# Patient Record
Sex: Male | Born: 1935 | Race: White | Hispanic: No | Smoking: Never smoker
Health system: Southern US, Community
[De-identification: ages and names within clinical notes are randomized; demographics above are authoritative.]

## PROBLEM LIST (undated history)

## (undated) DIAGNOSIS — M1990 Unspecified osteoarthritis, unspecified site: Secondary | ICD-10-CM

## (undated) DIAGNOSIS — I499 Cardiac arrhythmia, unspecified: Secondary | ICD-10-CM

## (undated) DIAGNOSIS — Z87442 Personal history of urinary calculi: Secondary | ICD-10-CM

## (undated) DIAGNOSIS — D649 Anemia, unspecified: Secondary | ICD-10-CM

## (undated) DIAGNOSIS — I351 Nonrheumatic aortic (valve) insufficiency: Secondary | ICD-10-CM

## (undated) DIAGNOSIS — E782 Mixed hyperlipidemia: Secondary | ICD-10-CM

## (undated) DIAGNOSIS — I872 Venous insufficiency (chronic) (peripheral): Secondary | ICD-10-CM

## (undated) DIAGNOSIS — C801 Malignant (primary) neoplasm, unspecified: Secondary | ICD-10-CM

## (undated) DIAGNOSIS — I6529 Occlusion and stenosis of unspecified carotid artery: Secondary | ICD-10-CM

## (undated) DIAGNOSIS — I480 Paroxysmal atrial fibrillation: Secondary | ICD-10-CM

## (undated) DIAGNOSIS — I700 Atherosclerosis of aorta: Secondary | ICD-10-CM

## (undated) HISTORY — PX: OTHER SURGICAL HISTORY: SHX169

## (undated) NOTE — *Deleted (*Deleted)
Brief Pharmacy Note  Pharmacy consulted to review patient's home medication of  Tikosyn. Confirmed with patient no missed doses and last Tikosyn dose was today at ~0530. Per patient, he typically takes the first dose of Tikosyn around 0800. Confirmed with patient that he takes Tikosyn 125 mcg Q12H.   Will resume home dose of Tikosyn 125 mcg Q12H.   11/18 1519 K 4, Mg 2, Scr 0.99. No electrolyte replacement needed at this time. Will continue to monitor and replace for goal of K > 4 and Mg > 2.  Thank you for allowing pharmacy to be a part of this patient's care.  Marcelino Scot PharmD Candidate [phone]

---

## 2006-08-29 HISTORY — PX: HERNIA REPAIR: SHX51

## 2008-08-29 DIAGNOSIS — Z87442 Personal history of urinary calculi: Secondary | ICD-10-CM

## 2008-08-29 HISTORY — DX: Personal history of urinary calculi: Z87.442

## 2010-08-29 HISTORY — PX: EYE SURGERY: SHX253

## 2014-07-16 DIAGNOSIS — N400 Enlarged prostate without lower urinary tract symptoms: Secondary | ICD-10-CM | POA: Insufficient documentation

## 2015-01-22 DIAGNOSIS — I340 Nonrheumatic mitral (valve) insufficiency: Secondary | ICD-10-CM | POA: Insufficient documentation

## 2015-04-29 DIAGNOSIS — I779 Disorder of arteries and arterioles, unspecified: Secondary | ICD-10-CM | POA: Insufficient documentation

## 2015-07-21 DIAGNOSIS — E7800 Pure hypercholesterolemia, unspecified: Secondary | ICD-10-CM | POA: Insufficient documentation

## 2015-07-21 DIAGNOSIS — N529 Male erectile dysfunction, unspecified: Secondary | ICD-10-CM | POA: Insufficient documentation

## 2015-10-20 DIAGNOSIS — D531 Other megaloblastic anemias, not elsewhere classified: Secondary | ICD-10-CM | POA: Diagnosis present

## 2015-10-28 DIAGNOSIS — I441 Atrioventricular block, second degree: Secondary | ICD-10-CM | POA: Insufficient documentation

## 2016-06-16 ENCOUNTER — Encounter
Admission: RE | Admit: 2016-06-16 | Discharge: 2016-06-16 | Disposition: A | Discharge status: Home / Self Care | Payer: Medicare Other | Source: Ambulatory Visit | Attending: Surgery | Admitting: Surgery

## 2016-06-16 DIAGNOSIS — K4090 Unilateral inguinal hernia, without obstruction or gangrene, not specified as recurrent: Secondary | ICD-10-CM | POA: Insufficient documentation

## 2016-06-16 DIAGNOSIS — Z01818 Encounter for other preprocedural examination: Secondary | ICD-10-CM | POA: Insufficient documentation

## 2016-06-16 HISTORY — DX: Malignant (primary) neoplasm, unspecified: C80.1

## 2016-06-16 HISTORY — DX: Personal history of urinary calculi: Z87.442

## 2016-06-16 HISTORY — DX: Unspecified osteoarthritis, unspecified site: M19.90

## 2016-06-16 HISTORY — DX: Cardiac arrhythmia, unspecified: I49.9

## 2016-06-16 NOTE — Pre-Procedure Instructions (Signed)
Name Value Ref Range  Vent Rate (bpm) 66   PR Interval (msec) 264   QRS Interval (msec) 120   QT Interval (msec) 436   QTc (msec) 457   Result Narrative  Sinus rhythm with marked sinus arrhythmia 1st degree AV block Right bundle branch block Left anterior fascicular block  Bifascicular block  Cannot rule out Anterior infarct , age undetermined Abnormal ECG When compared with ECG of 02-Feb-2016 11:14, No significant change was found I reviewed and concur with this report. Electronically signed [id number] MD, Darnell Level [phone]) on 05/21/2016 7:50:41 AM  Status Results Details

## 2016-06-16 NOTE — Patient Instructions (Addendum)
  Your procedure is scheduled on: June 24, 2016  Report to [facility]  To find out your arrival time please call [phone] between [place], June 23, 2016  Remember: Instructions that are not followed completely may result in serious medical risk, up to and including death, or upon the discretion of your surgeon and anesthesiologist your surgery may need to be rescheduled.    __X__ 1. Do not eat food or drink liquids after midnight. No gum chewing or hard candies.      __X__ 2. No Alcohol for 24 hours before or after surgery.   ____ 3. Do Not Smoke For 24 Hours Prior to Your Surgery.   ____ 4. Bring all medications with you on the day of surgery if instructed.    __X__ 5. Notify your doctor if there is any change in your medical condition     (cold, fever, infections).       Do not wear jewelry, make-up, hairpins, clips or nail polish.  Do not wear lotions, powders, or perfumes. You may wear deodorant.  Do not shave 48 hours prior to surgery. Men may shave face and neck.  Do not bring valuables to the hospital.    [facility] is not responsible for any belongings or valuables.               Contacts, dentures or bridgework may not be worn into surgery.  Leave your suitcase in the car. After surgery it may be brought to your room.  For patients admitted to the hospital, discharge time is determined by your treatment team.   Patients discharged the day of surgery will not be allowed to drive home.   Please read over the following fact sheets that you were given:   Pre surgery soap wash  ___x_ Take these medicines the morning of surgery with A SIP OF WATER:    1. TIKOSYN AND POTASSIUM...TAKE BOTH AS USUAL (AT 9AM) IF SURGERY SCHEDULED LATER IN DAY.  IF SURGERY IS                         SCHEDULED FOR FIRST EARLY MORNING CASE, BRING BOTH PILLS TO [place] WITH YOU  2.   3.   4.  5.  6.  ____ Fleet Enema (as directed)   __x__ Use  CHG Soap as directed  ____ Use inhalers on the day of surgery  ____ Stop metformin 2 days prior to surgery    ____ Take 1/2 of usual insulin dose the night before surgery and none on the morning of surgery.   __X__ Stop PRADAXA ON October 24TH   __X__ Stop supplements until after surgery.    ____ Bring C-Pap to the hospital.    PLEASE TRY TO REMEMBER TO BRING LIVING WILL AND POA PAPERS TO [place] ON THE DAY OF SURGERY SO THEY MAY BECOME A  PERMANENT PART OF YOUR CHART

## 2016-06-24 ENCOUNTER — Ambulatory Visit
Admission: RE | Admit: 2016-06-24 | Discharge: 2016-06-24 | Disposition: A | Discharge status: Home / Self Care | Payer: Medicare Other | Source: Ambulatory Visit | Attending: Surgery | Admitting: Surgery

## 2016-06-24 ENCOUNTER — Ambulatory Visit: Payer: Medicare Other | Admitting: Registered Nurse

## 2016-06-24 ENCOUNTER — Encounter
Admission: RE | Disposition: A | Discharge status: Home / Self Care | Payer: Self-pay | Source: Ambulatory Visit | Attending: Surgery

## 2016-06-24 ENCOUNTER — Encounter: Payer: Self-pay | Admitting: *Deleted

## 2016-06-24 DIAGNOSIS — K4090 Unilateral inguinal hernia, without obstruction or gangrene, not specified as recurrent: Secondary | ICD-10-CM | POA: Diagnosis not present

## 2016-06-24 DIAGNOSIS — I4891 Unspecified atrial fibrillation: Secondary | ICD-10-CM | POA: Insufficient documentation

## 2016-06-24 DIAGNOSIS — M1990 Unspecified osteoarthritis, unspecified site: Secondary | ICD-10-CM | POA: Diagnosis not present

## 2016-06-24 DIAGNOSIS — Z79899 Other long term (current) drug therapy: Secondary | ICD-10-CM | POA: Diagnosis not present

## 2016-06-24 HISTORY — PX: INGUINAL HERNIA REPAIR: SHX194

## 2016-06-24 SURGERY — REPAIR, HERNIA, INGUINAL, ADULT
Anesthesia: General | Site: Inguinal | Laterality: Right | Wound class: Clean Contaminated

## 2016-06-24 MED ORDER — HYDROCODONE-ACETAMINOPHEN 5-325 MG PO TABS: 1.0000 | ORAL_TABLET | ORAL | 0 refills | Status: DC | PRN

## 2016-06-24 MED ORDER — LIDOCAINE HCL (CARDIAC) 20 MG/ML IV SOLN
INTRAVENOUS | Status: DC | PRN
  Administered 2016-06-24: 80 mg via INTRAVENOUS

## 2016-06-24 MED ORDER — FENTANYL CITRATE (PF) 100 MCG/2ML IJ SOLN
INTRAMUSCULAR | Status: DC | PRN
  Administered 2016-06-24 (×2): 25 ug via INTRAVENOUS

## 2016-06-24 MED ORDER — CEFAZOLIN SODIUM-DEXTROSE 2-4 GM/100ML-% IV SOLN
INTRAVENOUS | Status: AC
  Administered 2016-06-24: 2 g via INTRAVENOUS
  Filled 2016-06-24: qty 100

## 2016-06-24 MED ORDER — BUPIVACAINE-EPINEPHRINE (PF) 0.5% -1:200000 IJ SOLN
INTRAMUSCULAR | Status: DC | PRN
  Administered 2016-06-24: 18 mL

## 2016-06-24 MED ORDER — FENTANYL CITRATE (PF) 100 MCG/2ML IJ SOLN: 25.0000 ug | INTRAMUSCULAR | Status: DC | PRN

## 2016-06-24 MED ORDER — PROPOFOL 10 MG/ML IV BOLUS
INTRAVENOUS | Status: DC | PRN
  Administered 2016-06-24: 200 mg via INTRAVENOUS

## 2016-06-24 MED ORDER — CEFAZOLIN SODIUM-DEXTROSE 2-4 GM/100ML-% IV SOLN
2.0000 g | Freq: Once | INTRAVENOUS | Status: AC
  Administered 2016-06-24: 2 g via INTRAVENOUS

## 2016-06-24 MED ORDER — ONDANSETRON HCL 4 MG/2ML IJ SOLN
INTRAMUSCULAR | Status: DC | PRN
  Administered 2016-06-24: 4 mg via INTRAVENOUS

## 2016-06-24 MED ORDER — MIDAZOLAM HCL 2 MG/2ML IJ SOLN
INTRAMUSCULAR | Status: DC | PRN
  Administered 2016-06-24: 1 mg via INTRAVENOUS

## 2016-06-24 MED ORDER — EPHEDRINE SULFATE 50 MG/ML IJ SOLN
INTRAMUSCULAR | Status: DC | PRN
  Administered 2016-06-24 (×3): 5 mg via INTRAVENOUS

## 2016-06-24 MED ORDER — PHENYLEPHRINE HCL 10 MG/ML IJ SOLN
INTRAMUSCULAR | Status: DC | PRN
  Administered 2016-06-24: 100 ug via INTRAVENOUS

## 2016-06-24 MED ORDER — HYDROCODONE-ACETAMINOPHEN 5-325 MG PO TABS: 1.0000 | ORAL_TABLET | ORAL | Status: DC | PRN

## 2016-06-24 MED ORDER — BUPIVACAINE-EPINEPHRINE (PF) 0.5% -1:200000 IJ SOLN
INTRAMUSCULAR | Status: AC
  Filled 2016-06-24: qty 30

## 2016-06-24 MED ORDER — GLYCOPYRROLATE 0.2 MG/ML IJ SOLN
INTRAMUSCULAR | Status: DC | PRN
  Administered 2016-06-24: 0.2 mg via INTRAVENOUS

## 2016-06-24 MED ORDER — FAMOTIDINE 20 MG PO TABS
ORAL_TABLET | ORAL | Status: AC
  Administered 2016-06-24: 20 mg via ORAL
  Filled 2016-06-24: qty 1

## 2016-06-24 MED ORDER — FAMOTIDINE 20 MG PO TABS
20.0000 mg | ORAL_TABLET | Freq: Once | ORAL | Status: AC
  Administered 2016-06-24: 20 mg via ORAL

## 2016-06-24 MED ORDER — LACTATED RINGERS IV SOLN
INTRAVENOUS | Status: DC
  Administered 2016-06-24: 10:00:00 via INTRAVENOUS

## 2016-06-24 MED ORDER — ONDANSETRON HCL 4 MG/2ML IJ SOLN: 4.0000 mg | Freq: Once | INTRAMUSCULAR | Status: DC | PRN

## 2016-06-24 SURGICAL SUPPLY — 32 items
BLADE CLIPPER SURG (BLADE) ×3 IMPLANT
BLADE SURG 15 STRL LF DISP TIS (BLADE) ×1 IMPLANT
BLADE SURG 15 STRL SS (BLADE) ×2
CANISTER SUCT 1200ML W/VALVE (MISCELLANEOUS) ×3 IMPLANT
CHLORAPREP W/TINT 26ML (MISCELLANEOUS) ×3 IMPLANT
DERMABOND ADVANCED (GAUZE/BANDAGES/DRESSINGS) ×2
DERMABOND ADVANCED .7 DNX12 (GAUZE/BANDAGES/DRESSINGS) ×1 IMPLANT
DRAIN PENROSE 5/8X18 LTX STRL (WOUND CARE) ×3 IMPLANT
DRAPE LAPAROTOMY 77X122 PED (DRAPES) ×3 IMPLANT
ELECT REM PT RETURN 9FT ADLT (ELECTROSURGICAL) ×3
ELECTRODE REM PT RTRN 9FT ADLT (ELECTROSURGICAL) ×1 IMPLANT
GLOVE BIO SURGEON STRL SZ 6.5 (GLOVE) ×2 IMPLANT
GLOVE BIO SURGEON STRL SZ7.5 (GLOVE) ×15 IMPLANT
GLOVE BIO SURGEONS STRL SZ 6.5 (GLOVE) ×1
GLOVE INDICATOR 7.0 STRL GRN (GLOVE) ×3 IMPLANT
GLOVE INDICATOR 8.0 STRL GRN (GLOVE) ×3 IMPLANT
GOWN STRL REUS W/ TWL LRG LVL3 (GOWN DISPOSABLE) ×4 IMPLANT
GOWN STRL REUS W/TWL LRG LVL3 (GOWN DISPOSABLE) ×8
KIT RM TURNOVER STRD PROC AR (KITS) ×3 IMPLANT
LABEL OR SOLS (LABEL) IMPLANT
LIQUID BAND (GAUZE/BANDAGES/DRESSINGS) IMPLANT
MESH SYNTHETIC 4X6 SOFT BARD (Mesh General) ×1 IMPLANT
MESH SYNTHETIC SOFT BARD 4X6 (Mesh General) ×2 IMPLANT
NEEDLE HYPO 25X1 1.5 SAFETY (NEEDLE) ×3 IMPLANT
NS IRRIG 500ML POUR BTL (IV SOLUTION) ×3 IMPLANT
PACK BASIN MINOR ARMC (MISCELLANEOUS) ×3 IMPLANT
SUT CHROMIC 4 0 RB 1X27 (SUTURE) ×3 IMPLANT
SUT MNCRL AB 4-0 PS2 18 (SUTURE) ×3 IMPLANT
SUT SURGILON 0 30 BLK (SUTURE) ×9 IMPLANT
SUT VIC AB 4-0 SH 27 (SUTURE) ×2
SUT VIC AB 4-0 SH 27XANBCTRL (SUTURE) ×1 IMPLANT
SYRINGE 10CC LL (SYRINGE) ×3 IMPLANT

## 2016-06-24 NOTE — Anesthesia Preprocedure Evaluation (Signed)
Anesthesia Evaluation  Patient identified by MRN, date of birth, ID band Patient awake    Reviewed: Allergy & Precautions, NPO status , Patient's Chart, lab work & pertinent test results, reviewed documented beta blocker date and time   Airway Mallampati: II  TM Distance: >3 FB     Dental  (+) Chipped   Pulmonary           Cardiovascular + dysrhythmias Atrial Fibrillation      Neuro/Psych    GI/Hepatic   Endo/Other    Renal/GU      Musculoskeletal  (+) Arthritis ,   Abdominal   Peds  Hematology   Anesthesia Other Findings   Reproductive/Obstetrics                             Anesthesia Physical Anesthesia Plan  ASA: III  Anesthesia Plan: General   Post-op Pain Management:    Induction: Intravenous  Airway Management Planned: LMA  Additional Equipment:   Intra-op Plan:   Post-operative Plan:   Informed Consent: I have reviewed the patients History and Physical, chart, labs and discussed the procedure including the risks, benefits and alternatives for the proposed anesthesia with the patient or authorized representative who has indicated his/her understanding and acceptance.     Plan Discussed with: CRNA  Anesthesia Plan Comments:         Anesthesia Quick Evaluation

## 2016-06-24 NOTE — Op Note (Signed)
OPERATIVE REPORT  PREOPERATIVE DIAGNOSIS: right inguinal hernia  POSTOPERATIVE DIAGNOSIS:right  inguinal hernia  PROCEDURE:  right inguinal hernia repair  ANESTHESIA:  General  SURGEON:  Rochel Brome M.D.  INDICATIONS: He reports recent bulging in the right groin. A right inguinal hernia was demonstrated on physical exam and repair was recommended for definitive treatment.  With the patient on the operating table in the supine position the right lower quadrant was prepared with clippers and with ChloraPrep and draped in a sterile manner. A transversely oriented suprapubic incision was made and carried down through subcutaneous tissues. Electrocautery was used for hemostasis. 2 traversing veins were suture ligated proximally and distally and divided. The Scarpa's fascia was incised. The external oblique aponeurosis was incised along the course of its fibers to open the external ring and expose the inguinal cord structures. The cord structures were mobilized. A Penrose drain was passed around the cord structures for traction. Cremaster fibers were separated and explored the cord structures. There was a tiny bit the peritoneum which was grasped which was about 12 mm in length and was ligated with 4-0 Vicryl and amputated. There was a direct inguinal hernia. The attenuated transversalis fascia was incised circumferentially and removed. Tissues were not submitted for pathology. The hernia sac was inverted. The repair was carried out with interrupted 0 Surgilon simple sutures suturing the conjoined tendon to the shelving edge of the inguinal ligament incorporating transversalis fascia into the repair. The last stitch led up to the internal ring.  Bard soft mesh was cut to create an oval shape and was placed over the repair. This was sutured to the repair with interrupted 0 Surgilon sutures and also sutured medially to the deep fascia and on both sides of the internal ring. Next after seeing hemostasis was  intact the cord structures were replaced along the floor of the inguinal canal. The cut edges of the external oblique aponeurosis were closed with a running 4-0 Vicryl suture to re-create the external ring. The deep fascia superior and lateral to the repair site was infiltrated with half percent Sensorcaine with epinephrine. Subcutaneous tissues were also infiltrated. The Scarpa's fascia was closed with interrupted 4-0 Vicryl sutures. The skin was closed with running 4-0 Monocryl subcuticular suture and LiquiBand. The testicle remained in the scrotum  The patient appeared to be in satisfactory condition and was prepared for transfer to the recovery room.  Rochel Brome M.D.

## 2016-06-24 NOTE — Anesthesia Postprocedure Evaluation (Signed)
Anesthesia Post Note  Patient: Terry Collins  Procedure(s) Performed: Procedure(s) (LRB): HERNIA REPAIR INGUINAL ADULT (Right)  Patient location during evaluation: PACU Anesthesia Type: General Level of consciousness: awake and alert Pain management: pain level controlled Vital Signs Assessment: post-procedure vital signs reviewed and stable Respiratory status: spontaneous breathing, nonlabored ventilation, respiratory function stable and patient connected to nasal cannula oxygen Cardiovascular status: blood pressure returned to baseline and stable Postop Assessment: no signs of nausea or vomiting Anesthetic complications: no    Last Vitals:  Vitals:   06/24/16 1258 06/24/16 1350  BP: (!) 147/83 133/61  Pulse: 64 66  Resp: 16 16  Temp:      Last Pain:  Vitals:   06/24/16 1350  TempSrc:   PainSc: [place]

## 2016-06-24 NOTE — Anesthesia Procedure Notes (Signed)
Procedure Name: LMA Insertion Date/Time: 06/24/2016 10:32 AM Performed by: Hedda Slade Pre-anesthesia Checklist: Patient identified, Emergency Drugs available, Suction available, Patient being monitored and Timeout performed Patient Re-evaluated:Patient Re-evaluated prior to inductionOxygen Delivery Method: Circle system utilized Preoxygenation: Pre-oxygenation with 100% oxygen Intubation Type: IV induction Ventilation: Mask ventilation without difficulty LMA: LMA inserted LMA Size: 4.5 Number of attempts: 1 Placement Confirmation: positive ETCO2,  CO2 detector and breath sounds checked- equal and bilateral Tube secured with: Tape Dental Injury: Teeth and Oropharynx as per pre-operative assessment

## 2016-06-24 NOTE — H&P (Signed)
  He reports no change in condition since office visit  He last took Pradaxa 4 days ago  Right side marked YES  Labs noted  Discussed plan for right inguinal hernia repair

## 2016-06-24 NOTE — Transfer of Care (Signed)
Immediate Anesthesia Transfer of Care Note  Patient: Terry Collins  Procedure(s) Performed: Procedure(s): HERNIA REPAIR INGUINAL ADULT (Right)  Patient Location: PACU  Anesthesia Type:General  Level of Consciousness: awake, alert  and patient cooperative  Airway & Oxygen Therapy: Patient Spontanous Breathing and Patient connected to face mask oxygen  Post-op Assessment: Report given to RN and Post -op Vital signs reviewed and stable  Post vital signs: Reviewed and stable  Last Vitals:  Vitals:   06/24/16 0950 06/24/16 1205  BP: 111/68 132/65  Pulse: 72 80  Resp: 16 14  Temp: 36.3 C (P) 36.4 C    Last Pain:  Vitals:   06/24/16 0950  TempSrc: Tympanic         Complications: No apparent anesthesia complications

## 2016-06-24 NOTE — Discharge Instructions (Signed)
AMBULATORY SURGERY  DISCHARGE INSTRUCTIONS   1) The drugs that you were given will stay in your system until tomorrow so for the next 24 hours you should not:  A) Drive an automobile B) Make any legal decisions C) Drink any alcoholic beverage   2) You may resume regular meals tomorrow.  Today it is better to start with liquids and gradually work up to solid foods.  You may eat anything you prefer, but it is better to start with liquids, then soup and crackers, and gradually work up to solid foods.   3) Please notify your doctor immediately if you have any unusual bleeding, trouble breathing, redness and pain at the surgery site, drainage, fever, or pain not relieved by medication.   4) Additional Instructions: Take Tylenol or Norco if needed for pain.  Should not drive or do anything dangerous when taking Norco.  Resume Pradaxa Saturday evening.  May shower.  Avoid straining and heavy lifting.

## 2016-06-27 ENCOUNTER — Encounter: Payer: Self-pay | Admitting: Surgery

## 2017-07-26 DIAGNOSIS — R3129 Other microscopic hematuria: Secondary | ICD-10-CM | POA: Insufficient documentation

## 2017-08-29 HISTORY — PX: CARDIAC ELECTROPHYSIOLOGY STUDY AND ABLATION: SHX1294

## 2017-08-31 DIAGNOSIS — I452 Bifascicular block: Secondary | ICD-10-CM | POA: Insufficient documentation

## 2018-08-14 DIAGNOSIS — F4321 Adjustment disorder with depressed mood: Secondary | ICD-10-CM | POA: Insufficient documentation

## 2018-09-04 DIAGNOSIS — I872 Venous insufficiency (chronic) (peripheral): Secondary | ICD-10-CM | POA: Insufficient documentation

## 2018-10-04 DIAGNOSIS — I351 Nonrheumatic aortic (valve) insufficiency: Secondary | ICD-10-CM | POA: Insufficient documentation

## 2019-09-16 ENCOUNTER — Other Ambulatory Visit: Payer: Self-pay | Admitting: Orthopedic Surgery

## 2019-09-23 ENCOUNTER — Other Ambulatory Visit: Payer: Self-pay | Admitting: Orthopedic Surgery

## 2019-09-23 DIAGNOSIS — R1900 Intra-abdominal and pelvic swelling, mass and lump, unspecified site: Secondary | ICD-10-CM

## 2019-10-04 ENCOUNTER — Other Ambulatory Visit: Payer: Self-pay

## 2019-10-04 ENCOUNTER — Ambulatory Visit
Admission: RE | Admit: 2019-10-04 | Discharge: 2019-10-04 | Disposition: A | Discharge status: Home / Self Care | Payer: Medicare Other | Source: Ambulatory Visit | Attending: Orthopedic Surgery | Admitting: Orthopedic Surgery

## 2019-10-04 DIAGNOSIS — R1900 Intra-abdominal and pelvic swelling, mass and lump, unspecified site: Secondary | ICD-10-CM | POA: Diagnosis not present

## 2019-10-04 LAB — POCT I-STAT CREATININE: Creatinine, Ser: 1 mg/dL (ref 0.61–1.24)

## 2019-10-04 MED ORDER — IOHEXOL 300 MG/ML  SOLN
100.0000 mL | Freq: Once | INTRAMUSCULAR | Status: AC | PRN
  Administered 2019-10-04: 100 mL via INTRAVENOUS

## 2019-10-10 DIAGNOSIS — I5033 Acute on chronic diastolic (congestive) heart failure: Secondary | ICD-10-CM | POA: Diagnosis present

## 2020-01-02 ENCOUNTER — Ambulatory Visit: Payer: Self-pay | Admitting: General Surgery

## 2020-01-02 NOTE — H&P (Signed)
PATIENT PROFILE: Terry Collins is a 84 y.o. male who presents to the Clinic for consultation at the request of Terry Collins for evaluation of recurrent inguinal hernia.  PCP:  Terry Glatter, MD  HISTORY OF PRESENT ILLNESS: Terry Collins reports patient with previous history of bilateral inguinal hernia repair different times.  Most recently he had the right inguinal hernia repair 4 years ago.  He reports that in the last 6 months he has been feeling a bulging.  He endorses having discomfort with bulging.  He reports that when he lies down the bleeding goes in and then when he stands up and start walking the bulging comes out.  There is no pain radiation.  Alleviating factor is laying down on reduction of the bulge.  Aggravating factor is ambulating and applying pressure over the bulge.  Denies abdominal distention nausea or vomiting.  The patient also reports having some discomfort on the left side but not as much as on the right side.  Left inguinal hernia repair was done more than 10 years ago.   PROBLEM LIST:        Problem List  Date Reviewed: 12/26/2019       Noted   Rosacea, acne 12/26/2019   Mild left ventricular systolic dysfunction 3/41/9379   Anemia 10/10/2019   Major depression, melancholic type 02/40/9735   Moderate aortic valve insufficiency 10/04/2018   Venous insufficiency of both lower extremities 09/04/2018   Grief 08/14/2018   RBBB (right bundle branch block with left anterior fascicular block) 08/31/2017   Visit for monitoring Tikosyn therapy 08/31/2017   Hematuria, microscopic 07/26/2017   Right inguinal hernia 05/12/2016   Mobitz type 1 second degree atrioventricular block 10/28/2015   Megaloblastic anemia 10/20/2015   Pure hypercholesterolemia 07/21/2015   Erectile dysfunction 07/21/2015   Bilateral carotid artery stenosis 04/29/2015   Overview    Less than 50% 2016      Moderate mitral insufficiency 01/22/2015   Chronic anemia, unspecified  01/15/2015   Mixed hyperlipidemia 10/09/2014   BPH without urinary obstruction 07/16/2014   Atrial fibrillation, currently on tikosyn and dabigatran 12/29/2011      GENERAL REVIEW OF SYSTEMS:   General ROS: negative for - chills, fatigue, fever, weight gain or weight loss Allergy and Immunology ROS: negative for - hives  Hematological and Lymphatic ROS: negative for - bleeding problems or bruising, negative for palpable nodes Endocrine ROS: negative for - heat or cold intolerance, hair changes Respiratory ROS: negative for - cough, shortness of breath or wheezing Cardiovascular ROS: no chest pain or palpitations GI ROS: negative for nausea, vomiting, abdominal pain, diarrhea, constipation Musculoskeletal ROS: negative for - joint swelling or muscle pain Neurological ROS: negative for - confusion, syncope Dermatological ROS: negative for pruritus and rash Psychiatric: negative for anxiety, depression, difficulty sleeping and memory loss  MEDICATIONS: Current Medications        Current Outpatient Medications  Medication Sig Dispense Refill  . azelaic acid (FINACEA) 15 % topical gel Apply topically daily. After skin is thoroughly washed and patted dry, gently but thoroughly massage a thin film of azelaic acid cream into the affected area twice daily, in the morning and evening.    . cycloSPORINE (RESTASIS) 0.05 % ophthalmic emulsion Place 1 drop into both eyes 2 (two) times daily.    . dabigatran (PRADAXA) 150 mg capsule Take 1 capsule (150 mg total) by mouth 2 (two) times daily 180 capsule 4  . dofetilide (TIKOSYN) 125 MCG capsule Take 1 capsule (125 mcg  total) by mouth every 12 (twelve) hours 180 capsule 0  . ferrous sulfate 325 (65 FE) MG tablet Take 325 mg by mouth daily with breakfast    . folic acid (FOLVITE) 1 MG tablet Take 1 mg by mouth once daily    . metroNIDAZOLE (METROGEL) 1 % gel Apply topically once daily    . multivitamin capsule Take 1 capsule by mouth  daily.    Marland Kitchen NOCDURNA, MEN, 55.3 mcg TbDL Take 55.3 mcg by mouth nightly    . potassium chloride (MICRO-K) 10 MEQ ER capsule TAKE 1 CAPSULE DAILY 90 capsule 3  . vit B complex no.12/niacin,B3, (VITAMIN B COMPLEX NO.12-NIACIN ORAL) Take by mouth     No current facility-administered medications for this visit.      ALLERGIES: Patient has no known allergies.  PAST MEDICAL HISTORY:     Past Medical History:  Diagnosis Date  . Arrhythmia   . Atrial fibrillation (CMS-HCC)   . Bilateral carotid artery stenosis   . Bilateral carotid artery stenosis   . Cardiomyopathy, secondary (CMS-HCC)   . Chronic anemia, unspecified   . Hyperlipidemia   . Hypertension   . Major depression, melancholic type 63/08/6008  . Megaloblastic anemia   . Rosacea     PAST SURGICAL HISTORY:      Past Surgical History:  Procedure Laterality Date  . cardiac ablation    . CARDIAC CATHETERIZATION  Oct 11, 2017  . CATARACT EXTRACTION Bilateral 2012  . HERNIA REPAIR  2008  . INGUINAL HERNIA REPAIR Right 06/24/2016   Dr Rochel Brome     FAMILY HISTORY:      Family History  Problem Relation Age of Onset  . Myocardial Infarction (Heart attack) Mother   . Breast cancer Mother   . Stroke Mother   . Cancer Father   . Heart disease Father   . Myocardial Infarction (Heart attack) Father   . Skin cancer Brother   . Atrial fibrillation (Abnormal heart rhythm sometimes requiring treatment with blood thinners) Son      SOCIAL HISTORY: Social History     Socioeconomic History  . Marital status: Widowed    Spouse name: Not on file  . Number of children: 2  . Years of education: 107  . Highest education level: Not on file  Occupational History  . Occupation: Retired Leisure centre manager  . Smoking status: Never Smoker  . Smokeless tobacco: Never Used  Vaping Use  . Vaping Use: Never used  Substance and Sexual Activity  . Alcohol use: Yes    Alcohol/week: 2.0  standard drinks    Types: 2 Glasses of wine per week  . Drug use: No  . Sexual activity: Not Currently    Partners: Female    Birth control/protection: None    Comment: I am a widower  Other Topics Concern  . Not on file  Social History Narrative  . Not on file   Social Determinants of Health      Financial Resource Strain:   . Difficulty of Paying Living Expenses:   Food Insecurity:   . Worried About Charity fundraiser in the Last Year:   . Arboriculturist in the Last Year:   Transportation Needs:   . Film/video editor (Medical):   Marland Kitchen Lack of Transportation (Non-Medical):       PHYSICAL EXAM:    Vitals:   12/31/19 1020  BP: 122/72  Pulse: 66   Body mass index is 21.06  kg/m. Weight: 74.4 kg (164 lb)   GENERAL: Alert, active, oriented x3  HEENT: Pupils equal reactive to light. Extraocular movements are intact. Sclera clear. Palpebral conjunctiva normal red color.Pharynx clear.  NECK: Supple with no palpable mass and no adenopathy.  LUNGS: Sound clear with no rales rhonchi or wheezes.  HEART: Regular rhythm S1 and S2 without murmur.  ABDOMEN: Soft and depressible, nontender with no palpable mass, no hepatomegaly.  Reducible right inguinal hernia, moderate size.  Difficult to appreciate on physical exam left inguinal hernia.  EXTREMITIES: Well-developed well-nourished symmetrical with no dependent edema.  NEUROLOGICAL: Awake alert oriented, facial expression symmetrical, moving all extremities.  REVIEW OF DATA: I have reviewed the following data today:      Appointment on 12/19/2019  Component Date Value  . WBC (White Blood Cell Co* 12/19/2019 6.8   . RBC (Red Blood Cell Coun* 12/19/2019 3.90*  . Hemoglobin 12/19/2019 13.3*  . Hematocrit 12/19/2019 40.1   . MCV (Mean Corpuscular Vo* 12/19/2019 102.8*  . MCH (Mean Corpuscular He* 12/19/2019 34.1*  . MCHC (Mean Corpuscular H* 12/19/2019 33.2   . Platelet Count 12/19/2019 194   .  RDW-CV (Red Cell Distrib* 12/19/2019 13.2   . MPV (Mean Platelet Volum* 12/19/2019 10.4   . Neutrophils 12/19/2019 4.27   . Lymphocytes 12/19/2019 1.77   . Monocytes 12/19/2019 0.67   . Eosinophils 12/19/2019 0.06   . Basophils 12/19/2019 0.03   . Neutrophil % 12/19/2019 62.6   . Lymphocyte % 12/19/2019 26.0   . Monocyte % 12/19/2019 9.8   . Eosinophil % 12/19/2019 0.9*  . Basophil% 12/19/2019 0.4   . Immature Granulocyte % 12/19/2019 0.3   . Immature Granulocyte Cou* 12/19/2019 0.02   . Glucose 12/19/2019 92   . Sodium 12/19/2019 142   . Potassium 12/19/2019 4.1   . Chloride 12/19/2019 104   . Carbon Dioxide (CO2) 12/19/2019 33.8*  . Urea Nitrogen (BUN) 12/19/2019 30*  . Creatinine 12/19/2019 1.0   . Glomerular Filtration Ra* 12/19/2019 72   . Calcium 12/19/2019 9.4   . AST  12/19/2019 16   . ALT  12/19/2019 13   . Alk Phos (alkaline Phosp* 12/19/2019 99   . Albumin 12/19/2019 3.9   . Bilirubin, Total 12/19/2019 0.9   . Protein, Total 12/19/2019 6.5   . A/G Ratio 12/19/2019 1.5   . Color 12/19/2019 Yellow   . Clarity 12/19/2019 Clear   . Specific Gravity 12/19/2019 1.025   . pH, Urine 12/19/2019 6.0   . Protein, Urinalysis 12/19/2019 Negative   . Glucose, Urinalysis 12/19/2019 Negative   . Ketones, Urinalysis 12/19/2019 Negative   . Blood, Urinalysis 12/19/2019 Small*  . Nitrite, Urinalysis 12/19/2019 Negative   . Leukocyte Esterase, Urin* 12/19/2019 Negative   . White Blood Cells, Urina* 12/19/2019 None Seen   . Red Blood Cells, Urinaly* 12/19/2019 4-10*  . Bacteria, Urinalysis 12/19/2019 None Seen   . Squamous Epithelial Cell* 12/19/2019 None Seen   . Crystals, Urinalysis 12/19/2019 Few*  Office Visit on 11/07/2019  Component Date Value  . Vent Rate (bpm) 11/07/2019 82   . PR Interval (msec) 11/07/2019 296   . QRS Interval (msec) 11/07/2019 124   . QT Interval (msec) 11/07/2019 416   . QTc (msec) 11/07/2019 486   . Glucose 11/07/2019 92   . Sodium 11/07/2019  136   . Potassium 11/07/2019 4.4   . Chloride 11/07/2019 99   . Carbon Dioxide (CO2) 11/07/2019 34.5*  . Calcium 11/07/2019 9.4   . Urea Nitrogen (  BUN) 11/07/2019 19   . Creatinine 11/07/2019 0.9   . Glomerular Filtration Ra* 11/07/2019 81   . BUN/Crea Ratio 11/07/2019 21.1*  . Anion Gap w/K 11/07/2019 6.9   . Magnesium 11/07/2019 1.9      ASSESSMENT: Mr. Moes is a 84 y.o. male presenting for consultation for bilateral recurrent inguinal hernia.    The patient presents with a symptomatic, reducible right inguinal hernia and a clinical left inguinal hernia not easily identified on physical exam. Patient was oriented about the diagnosis of inguinal hernia and its implication. The patient was oriented about the treatment alternatives (observation vs surgical repair). Due to patient symptoms, repair is recommended.  The patient previous open inguinal hernia repair I considered that laparoscopic, robotic assisted inguinal hernia repair will be the best alternative for this patient.  This will also confirm if he has bilateral inguinal hernia or just the right inguinal hernia.  Patient oriented about the surgical procedure, the use of mesh and its risk of complications such as: infection, bleeding, injury to vas deference, vasculature and testicle, injury to bowel or bladder, and chronic pain.  Due to patient history of atrial fibrillation and on anticoagulation, will ask cardiac clearance for holding anticoagulation for at least 48 hours before the surgery.  Patient oriented that he is at higher risk of bleeding due to his chronic history of anticoagulation.  Patient understood and agreed to proceed.  Bilateral recurrent inguinal hernia without obstruction or gangrene [K40.21]  PLAN: 1. Robotic assisted laparoscopic right vs bilateral inguinal hernia repair with mesh ([id number]) 2.  CBC, CMP 3.  Will contact cardiology for recommendations of holding Pradaxa before surgery 4.  Cardiac  Clearance 5.  Contact us if has any question or concern.   Patient verbalized understanding, all questions were answered, and were agreeable with the plan outlined above.   I spent a total of 60 minutes in both face-to-face and non-face-to-face activities for this visit on the date of this encounter.   Herbert Pun, MD  Electronically signed by Herbert Pun, MD

## 2020-01-02 NOTE — H&P (View-Only) (Signed)
PATIENT PROFILE: Terry Collins is a 84 y.o. male who presents to the Clinic for consultation at the request of Dr. Ginette Collins for evaluation of recurrent inguinal hernia.  PCP:  Terry Glatter, MD  HISTORY OF PRESENT ILLNESS: Terry Collins reports patient with previous history of bilateral inguinal hernia repair different times.  Most recently he had the right inguinal hernia repair 4 years ago.  He reports that in the last 6 months he has been feeling a bulging.  He endorses having discomfort with bulging.  He reports that when he lies down the bleeding goes in and then when he stands up and start walking the bulging comes out.  There is no pain radiation.  Alleviating factor is laying down on reduction of the bulge.  Aggravating factor is ambulating and applying pressure over the bulge.  Denies abdominal distention nausea or vomiting.  The patient also reports having some discomfort on the left side but not as much as on the right side.  Left inguinal hernia repair was done more than 10 years ago.   PROBLEM LIST:        Problem List  Date Reviewed: 12/26/2019       Noted   Rosacea, acne 12/26/2019   Mild left ventricular systolic dysfunction 3/41/9379   Anemia 10/10/2019   Major depression, melancholic type 02/40/9735   Moderate aortic valve insufficiency 10/04/2018   Venous insufficiency of both lower extremities 09/04/2018   Grief 08/14/2018   RBBB (right bundle branch block with left anterior fascicular block) 08/31/2017   Visit for monitoring Tikosyn therapy 08/31/2017   Hematuria, microscopic 07/26/2017   Right inguinal hernia 05/12/2016   Mobitz type 1 second degree atrioventricular block 10/28/2015   Megaloblastic anemia 10/20/2015   Pure hypercholesterolemia 07/21/2015   Erectile dysfunction 07/21/2015   Bilateral carotid artery stenosis 04/29/2015   Overview    Less than 50% 2016      Moderate mitral insufficiency 01/22/2015   Chronic anemia, unspecified  01/15/2015   Mixed hyperlipidemia 10/09/2014   BPH without urinary obstruction 07/16/2014   Atrial fibrillation, currently on tikosyn and dabigatran 12/29/2011      GENERAL REVIEW OF SYSTEMS:   General ROS: negative for - chills, fatigue, fever, weight gain or weight loss Allergy and Immunology ROS: negative for - hives  Hematological and Lymphatic ROS: negative for - bleeding problems or bruising, negative for palpable nodes Endocrine ROS: negative for - heat or cold intolerance, hair changes Respiratory ROS: negative for - cough, shortness of breath or wheezing Cardiovascular ROS: no chest pain or palpitations GI ROS: negative for nausea, vomiting, abdominal pain, diarrhea, constipation Musculoskeletal ROS: negative for - joint swelling or muscle pain Neurological ROS: negative for - confusion, syncope Dermatological ROS: negative for pruritus and rash Psychiatric: negative for anxiety, depression, difficulty sleeping and memory loss  MEDICATIONS: Current Medications        Current Outpatient Medications  Medication Sig Dispense Refill  . azelaic acid (FINACEA) 15 % topical gel Apply topically daily. After skin is thoroughly washed and patted dry, gently but thoroughly massage a thin film of azelaic acid cream into the affected area twice daily, in the morning and evening.    . cycloSPORINE (RESTASIS) 0.05 % ophthalmic emulsion Place 1 drop into both eyes 2 (two) times daily.    . dabigatran (PRADAXA) 150 mg capsule Take 1 capsule (150 mg total) by mouth 2 (two) times daily 180 capsule 4  . dofetilide (TIKOSYN) 125 MCG capsule Take 1 capsule (125 mcg  total) by mouth every 12 (twelve) hours 180 capsule 0  . ferrous sulfate 325 (65 FE) MG tablet Take 325 mg by mouth daily with breakfast    . folic acid (FOLVITE) 1 MG tablet Take 1 mg by mouth once daily    . metroNIDAZOLE (METROGEL) 1 % gel Apply topically once daily    . multivitamin capsule Take 1 capsule by mouth  daily.    Marland Kitchen NOCDURNA, MEN, 55.3 mcg TbDL Take 55.3 mcg by mouth nightly    . potassium chloride (MICRO-K) 10 MEQ ER capsule TAKE 1 CAPSULE DAILY 90 capsule 3  . vit B complex no.12/niacin,B3, (VITAMIN B COMPLEX NO.12-NIACIN ORAL) Take by mouth     No current facility-administered medications for this visit.      ALLERGIES: Patient has no known allergies.  PAST MEDICAL HISTORY:     Past Medical History:  Diagnosis Date  . Arrhythmia   . Atrial fibrillation (CMS-HCC)   . Bilateral carotid artery stenosis   . Bilateral carotid artery stenosis   . Cardiomyopathy, secondary (CMS-HCC)   . Chronic anemia, unspecified   . Hyperlipidemia   . Hypertension   . Major depression, melancholic type 63/08/6008  . Megaloblastic anemia   . Rosacea     PAST SURGICAL HISTORY:      Past Surgical History:  Procedure Laterality Date  . cardiac ablation    . CARDIAC CATHETERIZATION  Oct 11, 2017  . CATARACT EXTRACTION Bilateral 2012  . HERNIA REPAIR  2008  . INGUINAL HERNIA REPAIR Right 06/24/2016   Dr Terry Collins     FAMILY HISTORY:      Family History  Problem Relation Age of Onset  . Myocardial Infarction (Heart attack) Mother   . Breast cancer Mother   . Stroke Mother   . Cancer Father   . Heart disease Father   . Myocardial Infarction (Heart attack) Father   . Skin cancer Brother   . Atrial fibrillation (Abnormal heart rhythm sometimes requiring treatment with blood thinners) Son      SOCIAL HISTORY: Social History     Socioeconomic History  . Marital status: Widowed    Spouse name: Not on file  . Number of children: 2  . Years of education: 107  . Highest education level: Not on file  Occupational History  . Occupation: Retired Leisure centre manager  . Smoking status: Never Smoker  . Smokeless tobacco: Never Used  Vaping Use  . Vaping Use: Never used  Substance and Sexual Activity  . Alcohol use: Yes    Alcohol/week: 2.0  standard drinks    Types: 2 Glasses of wine per week  . Drug use: No  . Sexual activity: Not Currently    Partners: Female    Birth control/protection: None    Comment: I am a widower  Other Topics Concern  . Not on file  Social History Narrative  . Not on file   Social Determinants of Health      Financial Resource Strain:   . Difficulty of Paying Living Expenses:   Food Insecurity:   . Worried About Charity fundraiser in the Last Year:   . Arboriculturist in the Last Year:   Transportation Needs:   . Film/video editor (Medical):   Marland Kitchen Lack of Transportation (Non-Medical):       PHYSICAL EXAM:    Vitals:   12/31/19 1020  BP: 122/72  Pulse: 66   Body mass index is 21.06  kg/m. Weight: 74.4 kg (164 lb)   GENERAL: Alert, active, oriented x3  HEENT: Pupils equal reactive to light. Extraocular movements are intact. Sclera clear. Palpebral conjunctiva normal red color.Pharynx clear.  NECK: Supple with no palpable mass and no adenopathy.  LUNGS: Sound clear with no rales rhonchi or wheezes.  HEART: Regular rhythm S1 and S2 without murmur.  ABDOMEN: Soft and depressible, nontender with no palpable mass, no hepatomegaly.  Reducible right inguinal hernia, moderate size.  Difficult to appreciate on physical exam left inguinal hernia.  EXTREMITIES: Well-developed well-nourished symmetrical with no dependent edema.  NEUROLOGICAL: Awake alert oriented, facial expression symmetrical, moving all extremities.  REVIEW OF DATA: I have reviewed the following data today:      Appointment on 12/19/2019  Component Date Value  . WBC (White Blood Cell Co* 12/19/2019 6.8   . RBC (Red Blood Cell Coun* 12/19/2019 3.90*  . Hemoglobin 12/19/2019 13.3*  . Hematocrit 12/19/2019 40.1   . MCV (Mean Corpuscular Vo* 12/19/2019 102.8*  . MCH (Mean Corpuscular He* 12/19/2019 34.1*  . MCHC (Mean Corpuscular H* 12/19/2019 33.2   . Platelet Count 12/19/2019 194   .  RDW-CV (Red Cell Distrib* 12/19/2019 13.2   . MPV (Mean Platelet Volum* 12/19/2019 10.4   . Neutrophils 12/19/2019 4.27   . Lymphocytes 12/19/2019 1.77   . Monocytes 12/19/2019 0.67   . Eosinophils 12/19/2019 0.06   . Basophils 12/19/2019 0.03   . Neutrophil % 12/19/2019 62.6   . Lymphocyte % 12/19/2019 26.0   . Monocyte % 12/19/2019 9.8   . Eosinophil % 12/19/2019 0.9*  . Basophil% 12/19/2019 0.4   . Immature Granulocyte % 12/19/2019 0.3   . Immature Granulocyte Cou* 12/19/2019 0.02   . Glucose 12/19/2019 92   . Sodium 12/19/2019 142   . Potassium 12/19/2019 4.1   . Chloride 12/19/2019 104   . Carbon Dioxide (CO2) 12/19/2019 33.8*  . Urea Nitrogen (BUN) 12/19/2019 30*  . Creatinine 12/19/2019 1.0   . Glomerular Filtration Ra* 12/19/2019 72   . Calcium 12/19/2019 9.4   . AST  12/19/2019 16   . ALT  12/19/2019 13   . Alk Phos (alkaline Phosp* 12/19/2019 99   . Albumin 12/19/2019 3.9   . Bilirubin, Total 12/19/2019 0.9   . Protein, Total 12/19/2019 6.5   . A/G Ratio 12/19/2019 1.5   . Color 12/19/2019 Yellow   . Clarity 12/19/2019 Clear   . Specific Gravity 12/19/2019 1.025   . pH, Urine 12/19/2019 6.0   . Protein, Urinalysis 12/19/2019 Negative   . Glucose, Urinalysis 12/19/2019 Negative   . Ketones, Urinalysis 12/19/2019 Negative   . Blood, Urinalysis 12/19/2019 Small*  . Nitrite, Urinalysis 12/19/2019 Negative   . Leukocyte Esterase, Urin* 12/19/2019 Negative   . White Blood Cells, Urina* 12/19/2019 None Seen   . Red Blood Cells, Urinaly* 12/19/2019 4-10*  . Bacteria, Urinalysis 12/19/2019 None Seen   . Squamous Epithelial Cell* 12/19/2019 None Seen   . Crystals, Urinalysis 12/19/2019 Few*  Office Visit on 11/07/2019  Component Date Value  . Vent Rate (bpm) 11/07/2019 82   . PR Interval (msec) 11/07/2019 296   . QRS Interval (msec) 11/07/2019 124   . QT Interval (msec) 11/07/2019 416   . QTc (msec) 11/07/2019 486   . Glucose 11/07/2019 92   . Sodium 11/07/2019  136   . Potassium 11/07/2019 4.4   . Chloride 11/07/2019 99   . Carbon Dioxide (CO2) 11/07/2019 34.5*  . Calcium 11/07/2019 9.4   . Urea Nitrogen (  BUN) 11/07/2019 19   . Creatinine 11/07/2019 0.9   . Glomerular Filtration Ra* 11/07/2019 81   . BUN/Crea Ratio 11/07/2019 21.1*  . Anion Gap w/K 11/07/2019 6.9   . Magnesium 11/07/2019 1.9      ASSESSMENT: Mr. Moes is a 84 y.o. male presenting for consultation for bilateral recurrent inguinal hernia.    The patient presents with a symptomatic, reducible right inguinal hernia and a clinical left inguinal hernia not easily identified on physical exam. Patient was oriented about the diagnosis of inguinal hernia and its implication. The patient was oriented about the treatment alternatives (observation vs surgical repair). Due to patient symptoms, repair is recommended.  The patient previous open inguinal hernia repair I considered that laparoscopic, robotic assisted inguinal hernia repair will be the best alternative for this patient.  This will also confirm if he has bilateral inguinal hernia or just the right inguinal hernia.  Patient oriented about the surgical procedure, the use of mesh and its risk of complications such as: infection, bleeding, injury to vas deference, vasculature and testicle, injury to bowel or bladder, and chronic pain.  Due to patient history of atrial fibrillation and on anticoagulation, will ask cardiac clearance for holding anticoagulation for at least 48 hours before the surgery.  Patient oriented that he is at higher risk of bleeding due to his chronic history of anticoagulation.  Patient understood and agreed to proceed.  Bilateral recurrent inguinal hernia without obstruction or gangrene [K40.21]  PLAN: 1. Robotic assisted laparoscopic right vs bilateral inguinal hernia repair with mesh ([id number]) 2.  CBC, CMP 3.  Will contact cardiology for recommendations of holding Pradaxa before surgery 4.  Cardiac  Clearance 5.  Contact us if has any question or concern.   Patient verbalized understanding, all questions were answered, and were agreeable with the plan outlined above.   I spent a total of 60 minutes in both face-to-face and non-face-to-face activities for this visit on the date of this encounter.   Herbert Pun, MD  Electronically signed by Herbert Pun, MD

## 2020-01-10 ENCOUNTER — Other Ambulatory Visit: Payer: Self-pay

## 2020-01-10 ENCOUNTER — Encounter
Admission: RE | Admit: 2020-01-10 | Discharge: 2020-01-10 | Disposition: A | Discharge status: Home / Self Care | Payer: Medicare Other | Source: Ambulatory Visit | Attending: General Surgery | Admitting: General Surgery

## 2020-01-10 HISTORY — DX: Anemia, unspecified: D64.9

## 2020-01-10 NOTE — Patient Instructions (Signed)
Your procedure is scheduled on: Monday Jan 20, 2020. Report to Day Surgery. To find out your arrival time please call [phone] between 1PM - 3PM on Friday Jan 17, 2020.  Remember: Instructions that are not followed completely may result in serious medical risk,  up to and including death, or upon the discretion of your surgeon and anesthesiologist your  surgery may need to be rescheduled.     _X__ 1. Do not eat food after midnight the night before your procedure.                 No gum chewing or hard candies. You may drink clear liquids up to 2 hours                 before you are scheduled to arrive for your surgery- DO not drink clear                 liquids within 2 hours of the start of your surgery.                 Clear Liquids include:  water, apple juice without pulp, clear Gatorade, G2 or                  Gatorade Zero (avoid Red/Purple/Blue), Black Coffee or Tea (Do not add                 anything to coffee or tea).  __X__2.  On the morning of surgery brush your teeth with toothpaste and water, you                may rinse your mouth with mouthwash if you wish.  Do not swallow any toothpaste of mouthwash.     _X__ 3.  No Alcohol for 24 hours before or after surgery.   _X__ 4.  Do Not Smoke or use e-cigarettes For 24 Hours Prior to Your Surgery.                 Do not use any chewable tobacco products for at least 6 hours prior to                 Surgery.  _X__  5.  Do not use any recreational drugs (marijuana, cocaine, heroin, ecstacy, MDMA or other)                For at least one week prior to your surgery.  Combination of these drugs with anesthesia                May have life threatening results.  __x__ 6.  Notify your doctor if there is any change in your medical condition      (cold, fever, infections).     Do not wear jewelry, make-up, hairpins, clips or nail polish. Do not wear lotions, powders, or perfumes. You may wear  deodorant. Do not shave 48 hours prior to surgery. Men may shave face and neck. Do not bring valuables to the hospital.    [facility] is not responsible for any belongings or valuables.  Contacts, dentures or bridgework may not be worn into surgery. Leave your suitcase in the car. After surgery it may be brought to your room. For patients admitted to the hospital, discharge time is determined by your treatment team.   Patients discharged the day of surgery will not be allowed to drive home.   Make arrangements for someone to be with you for the first  24 hours of your Same Day Discharge.   __x__ Take these medicines the morning of surgery with A SIP OF WATER:    1. dofetilide (TIKOSYN) 125 MCG   __x__ Use CHG Soap as directed  __x__ Stop dabigatran (PRADAXA) 150 MG May 20,  As instructed by your doctor.   __x__ Stop Anti-inflammatories such as Ibuprofen, Aleve, naproxen, aspirin and or BC powders.    __x__ Stop supplements until after surgery.    __x__ Do not start any herbal supplements before your surgery.

## 2020-01-16 ENCOUNTER — Other Ambulatory Visit
Admission: RE | Admit: 2020-01-16 | Discharge: 2020-01-16 | Disposition: A | Discharge status: Home / Self Care | Payer: Medicare Other | Source: Ambulatory Visit | Attending: General Surgery | Admitting: General Surgery

## 2020-01-16 ENCOUNTER — Other Ambulatory Visit: Payer: Self-pay

## 2020-01-16 DIAGNOSIS — Z20822 Contact with and (suspected) exposure to covid-19: Secondary | ICD-10-CM | POA: Diagnosis not present

## 2020-01-16 DIAGNOSIS — Z01812 Encounter for preprocedural laboratory examination: Secondary | ICD-10-CM | POA: Diagnosis present

## 2020-01-16 LAB — SARS CORONAVIRUS 2 (TAT 6-24 HRS): SARS Coronavirus 2: NEGATIVE

## 2020-01-20 ENCOUNTER — Ambulatory Visit: Payer: Medicare Other | Admitting: Certified Registered"

## 2020-01-20 ENCOUNTER — Encounter
Admission: RE | Disposition: A | Discharge status: Home / Self Care | Payer: Self-pay | Source: Home / Self Care | Attending: General Surgery

## 2020-01-20 ENCOUNTER — Ambulatory Visit
Admission: RE | Admit: 2020-01-20 | Discharge: 2020-01-20 | Disposition: A | Discharge status: Home / Self Care | Payer: Medicare Other | Attending: General Surgery | Admitting: General Surgery

## 2020-01-20 ENCOUNTER — Encounter: Payer: Self-pay | Admitting: General Surgery

## 2020-01-20 ENCOUNTER — Other Ambulatory Visit: Payer: Self-pay

## 2020-01-20 DIAGNOSIS — E782 Mixed hyperlipidemia: Secondary | ICD-10-CM | POA: Diagnosis not present

## 2020-01-20 DIAGNOSIS — Z79899 Other long term (current) drug therapy: Secondary | ICD-10-CM | POA: Insufficient documentation

## 2020-01-20 DIAGNOSIS — I6523 Occlusion and stenosis of bilateral carotid arteries: Secondary | ICD-10-CM | POA: Insufficient documentation

## 2020-01-20 DIAGNOSIS — N400 Enlarged prostate without lower urinary tract symptoms: Secondary | ICD-10-CM | POA: Diagnosis not present

## 2020-01-20 DIAGNOSIS — I10 Essential (primary) hypertension: Secondary | ICD-10-CM | POA: Insufficient documentation

## 2020-01-20 DIAGNOSIS — K4021 Bilateral inguinal hernia, without obstruction or gangrene, recurrent: Secondary | ICD-10-CM | POA: Insufficient documentation

## 2020-01-20 HISTORY — PX: XI ROBOTIC ASSISTED INGUINAL HERNIA REPAIR WITH MESH: SHX6706

## 2020-01-20 SURGERY — REPAIR, HERNIA, INGUINAL, ROBOT-ASSISTED, LAPAROSCOPIC, USING MESH
Anesthesia: General | Site: Groin | Laterality: Bilateral

## 2020-01-20 MED ORDER — FENTANYL CITRATE (PF) 100 MCG/2ML IJ SOLN
INTRAMUSCULAR | Status: AC
  Filled 2020-01-20: qty 2

## 2020-01-20 MED ORDER — SEVOFLURANE IN SOLN
RESPIRATORY_TRACT | Status: AC
  Filled 2020-01-20: qty 250

## 2020-01-20 MED ORDER — BUPIVACAINE HCL (PF) 0.25 % IJ SOLN
INTRAMUSCULAR | Status: AC
  Filled 2020-01-20: qty 30

## 2020-01-20 MED ORDER — LIDOCAINE HCL (CARDIAC) PF 100 MG/5ML IV SOSY
PREFILLED_SYRINGE | INTRAVENOUS | Status: DC | PRN
  Administered 2020-01-20: 60 mg via INTRAVENOUS

## 2020-01-20 MED ORDER — BUPIVACAINE-EPINEPHRINE 0.25% -1:200000 IJ SOLN
INTRAMUSCULAR | Status: DC | PRN
  Administered 2020-01-20: 10 mL
  Administered 2020-01-20: 30 mL

## 2020-01-20 MED ORDER — FAMOTIDINE 20 MG PO TABS: 20.0000 mg | ORAL_TABLET | Freq: Once | ORAL | Status: AC

## 2020-01-20 MED ORDER — ONDANSETRON HCL 4 MG/2ML IJ SOLN
INTRAMUSCULAR | Status: DC | PRN
  Administered 2020-01-20: 4 mg via INTRAVENOUS

## 2020-01-20 MED ORDER — ROCURONIUM BROMIDE 100 MG/10ML IV SOLN
INTRAVENOUS | Status: DC | PRN
  Administered 2020-01-20: 10 mg via INTRAVENOUS
  Administered 2020-01-20: 40 mg via INTRAVENOUS
  Administered 2020-01-20 (×2): 20 mg via INTRAVENOUS

## 2020-01-20 MED ORDER — DEXAMETHASONE SODIUM PHOSPHATE 10 MG/ML IJ SOLN
INTRAMUSCULAR | Status: DC | PRN
  Administered 2020-01-20: 10 mg via INTRAVENOUS

## 2020-01-20 MED ORDER — EPINEPHRINE PF 1 MG/ML IJ SOLN
INTRAMUSCULAR | Status: AC
  Filled 2020-01-20: qty 1

## 2020-01-20 MED ORDER — OXYCODONE HCL 5 MG/5ML PO SOLN: 5.0000 mg | Freq: Once | ORAL | Status: DC | PRN

## 2020-01-20 MED ORDER — SUGAMMADEX SODIUM 200 MG/2ML IV SOLN
INTRAVENOUS | Status: DC | PRN
  Administered 2020-01-20: 145.2 mg via INTRAVENOUS

## 2020-01-20 MED ORDER — "VISTASEAL 4 ML SINGLE DOSE KIT "
PACK | CUTANEOUS | Status: DC | PRN
  Administered 2020-01-20: 4 mL via TOPICAL

## 2020-01-20 MED ORDER — FENTANYL CITRATE (PF) 100 MCG/2ML IJ SOLN
INTRAMUSCULAR | Status: DC | PRN
  Administered 2020-01-20: 25 ug via INTRAVENOUS
  Administered 2020-01-20: 50 ug via INTRAVENOUS
  Administered 2020-01-20: 25 ug via INTRAVENOUS
  Administered 2020-01-20: 50 ug via INTRAVENOUS

## 2020-01-20 MED ORDER — KETOROLAC TROMETHAMINE 30 MG/ML IJ SOLN
INTRAMUSCULAR | Status: DC | PRN
  Administered 2020-01-20: 30 mg via INTRAVENOUS

## 2020-01-20 MED ORDER — LACTATED RINGERS IV SOLN: INTRAVENOUS | Status: DC

## 2020-01-20 MED ORDER — HYDROCODONE-ACETAMINOPHEN 5-325 MG PO TABS: 1.0000 | ORAL_TABLET | ORAL | 0 refills | Status: AC | PRN

## 2020-01-20 MED ORDER — ONDANSETRON HCL 4 MG/2ML IJ SOLN: 4.0000 mg | Freq: Once | INTRAMUSCULAR | Status: DC | PRN

## 2020-01-20 MED ORDER — PROPOFOL 10 MG/ML IV BOLUS
INTRAVENOUS | Status: DC | PRN
  Administered 2020-01-20: 70 mg via INTRAVENOUS

## 2020-01-20 MED ORDER — SUCCINYLCHOLINE CHLORIDE 20 MG/ML IJ SOLN
INTRAMUSCULAR | Status: DC | PRN
  Administered 2020-01-20: 80 mg via INTRAVENOUS

## 2020-01-20 MED ORDER — FAMOTIDINE 20 MG PO TABS
ORAL_TABLET | ORAL | Status: AC
  Administered 2020-01-20: 20 mg via ORAL
  Filled 2020-01-20: qty 1

## 2020-01-20 MED ORDER — CEFAZOLIN SODIUM-DEXTROSE 2-4 GM/100ML-% IV SOLN
2.0000 g | INTRAVENOUS | Status: AC
  Administered 2020-01-20: 2 g via INTRAVENOUS

## 2020-01-20 MED ORDER — FENTANYL CITRATE (PF) 100 MCG/2ML IJ SOLN
INTRAMUSCULAR | Status: AC
  Administered 2020-01-20: 25 ug via INTRAVENOUS
  Filled 2020-01-20: qty 2

## 2020-01-20 MED ORDER — CEFAZOLIN SODIUM-DEXTROSE 2-4 GM/100ML-% IV SOLN
INTRAVENOUS | Status: AC
  Filled 2020-01-20: qty 100

## 2020-01-20 MED ORDER — FENTANYL CITRATE (PF) 100 MCG/2ML IJ SOLN
25.0000 ug | INTRAMUSCULAR | Status: DC | PRN
  Administered 2020-01-20: 25 ug via INTRAVENOUS

## 2020-01-20 MED ORDER — OXYCODONE HCL 5 MG PO TABS: 5.0000 mg | ORAL_TABLET | Freq: Once | ORAL | Status: DC | PRN

## 2020-01-20 SURGICAL SUPPLY — 53 items
APPLICATOR CHLORAPREP 10 TEAL (MISCELLANEOUS) IMPLANT
APPLICATOR VISTASEAL 35 (MISCELLANEOUS) ×2 IMPLANT
BAG INFUSER PRESSURE 100CC (MISCELLANEOUS) ×2 IMPLANT
BLADE SURG SZ11 CARB STEEL (BLADE) ×3 IMPLANT
CANISTER SUCT 1200ML W/VALVE (MISCELLANEOUS) ×3 IMPLANT
CHLORAPREP W/TINT 26 (MISCELLANEOUS) ×3 IMPLANT
COVER TIP SHEARS 8 DVNC (MISCELLANEOUS) ×1 IMPLANT
COVER TIP SHEARS 8MM DA VINCI (MISCELLANEOUS) ×2
COVER WAND RF STERILE (DRAPES) ×6 IMPLANT
DEFOGGER SCOPE WARMER CLEARIFY (MISCELLANEOUS) ×3 IMPLANT
DERMABOND ADVANCED (GAUZE/BANDAGES/DRESSINGS) ×2
DERMABOND ADVANCED .7 DNX12 (GAUZE/BANDAGES/DRESSINGS) ×1 IMPLANT
DRAPE ARM DVNC X/XI (DISPOSABLE) ×3 IMPLANT
DRAPE COLUMN DVNC XI (DISPOSABLE) ×1 IMPLANT
DRAPE DA VINCI XI ARM (DISPOSABLE) ×6
DRAPE DA VINCI XI COLUMN (DISPOSABLE) ×2
ELECT REM PT RETURN 9FT ADLT (ELECTROSURGICAL) ×3
ELECTRODE REM PT RTRN 9FT ADLT (ELECTROSURGICAL) ×1 IMPLANT
GLOVE BIO SURGEON STRL SZ 6.5 (GLOVE) ×4 IMPLANT
GLOVE BIO SURGEONS STRL SZ 6.5 (GLOVE) ×2
GLOVE BIOGEL PI IND STRL 6.5 (GLOVE) ×2 IMPLANT
GLOVE BIOGEL PI INDICATOR 6.5 (GLOVE) ×4
GOWN STRL REUS W/ TWL LRG LVL3 (GOWN DISPOSABLE) ×3 IMPLANT
GOWN STRL REUS W/TWL LRG LVL3 (GOWN DISPOSABLE) ×6
IRRIGATOR SUCT 8 DISP DVNC XI (IRRIGATION / IRRIGATOR) IMPLANT
IRRIGATOR SUCTION 8MM XI DISP (IRRIGATION / IRRIGATOR) ×2
IV CATH ANGIO 12GX3 LT BLUE (NEEDLE) ×2 IMPLANT
IV NS 1000ML (IV SOLUTION)
IV NS 1000ML BAXH (IV SOLUTION) IMPLANT
KIT PINK PAD W/HEAD ARE REST (MISCELLANEOUS) ×3
KIT PINK PAD W/HEAD ARM REST (MISCELLANEOUS) ×1 IMPLANT
LABEL OR SOLS (LABEL) IMPLANT
MESH 3DMAX 4X6 LT LRG (Mesh General) ×2 IMPLANT
MESH 3DMAX 4X6 RT LRG (Mesh General) ×2 IMPLANT
MESH 3DMAX MID 4X6 LT LRG (Mesh General) IMPLANT
MESH 3DMAX MID 4X6 RT LRG (Mesh General) IMPLANT
NDL INSUFFLATION 14GA 120MM (NEEDLE) ×1 IMPLANT
NEEDLE HYPO 22GX1.5 SAFETY (NEEDLE) ×3 IMPLANT
NEEDLE INSUFFLATION 14GA 120MM (NEEDLE) ×3 IMPLANT
OBTURATOR OPTICAL STANDARD 8MM (TROCAR) ×2
OBTURATOR OPTICAL STND 8 DVNC (TROCAR) ×1
OBTURATOR OPTICALSTD 8 DVNC (TROCAR) ×1 IMPLANT
PACK LAP CHOLECYSTECTOMY (MISCELLANEOUS) ×3 IMPLANT
SEAL CANN UNIV 5-8 DVNC XI (MISCELLANEOUS) ×3 IMPLANT
SEAL XI 5MM-8MM UNIVERSAL (MISCELLANEOUS) ×6
SET TUBE SMOKE EVAC HIGH FLOW (TUBING) ×3 IMPLANT
SOLUTION ELECTROLUBE (MISCELLANEOUS) ×3 IMPLANT
SUT MNCRL AB 4-0 PS2 18 (SUTURE) ×3 IMPLANT
SUT VIC AB 2-0 SH 27 (SUTURE) ×2
SUT VIC AB 2-0 SH 27XBRD (SUTURE) ×1 IMPLANT
SUT VLOC 90 S/L VL9 GS22 (SUTURE) ×3 IMPLANT
TAPE TRANSPORE STRL 2 31045 (GAUZE/BANDAGES/DRESSINGS) IMPLANT
TRAY FOLEY MTR SLVR 16FR STAT (SET/KITS/TRAYS/PACK) ×3 IMPLANT

## 2020-01-20 NOTE — Anesthesia Preprocedure Evaluation (Addendum)
Anesthesia Evaluation  Patient identified by MRN, date of birth, ID band Patient awake    Reviewed: Allergy & Precautions, H&P , NPO status , Patient's Chart, lab work & pertinent test results  Airway Mallampati: III  TM Distance: <3 FB Neck ROM: full    Dental  (+) Teeth Intact   Pulmonary neg pulmonary ROS, neg COPD,    breath sounds clear to auscultation       Cardiovascular (-) angina(-) Past MI + dysrhythmias Atrial Fibrillation  Rhythm:irregular Rate:Normal     Neuro/Psych negative neurological ROS  negative psych ROS   GI/Hepatic negative GI ROS, Neg liver ROS,   Endo/Other  negative endocrine ROS  Renal/GU      Musculoskeletal  (+) Arthritis ,   Abdominal   Peds  Hematology negative hematology ROS (+)   Anesthesia Other Findings Past Medical History: No date: Anemia No date: Arthritis     Comment:  hands No date: Cancer ([place])     Comment:  skin precancerous areas have been removed No date: Dysrhythmia     Comment:  treated for atrial fib 2010: History of kidney stones  Past Surgical History: 2012: EYE SURGERY; Bilateral     Comment:  cataracts 2008: HERNIA REPAIR; Left     Comment:  inguinal 06/24/2016: INGUINAL HERNIA REPAIR; Right     Comment:  Procedure: HERNIA REPAIR INGUINAL ADULT;  Surgeon:               Leonie Green, MD;  Location: [facility] ORS;  Service:               General;  Laterality: Right;     Reproductive/Obstetrics negative OB ROS                          Anesthesia Physical Anesthesia Plan  ASA: II  Anesthesia Plan: General ETT   Post-op Pain Management:    Induction:   PONV Risk Score and Plan: Ondansetron, Dexamethasone and Treatment may vary due to age or medical condition  Airway Management Planned:   Additional Equipment:   Intra-op Plan:   Post-operative Plan:   Informed Consent: I have reviewed the patients History and  Physical, chart, labs and discussed the procedure including the risks, benefits and alternatives for the proposed anesthesia with the patient or authorized representative who has indicated his/her understanding and acceptance.     Dental Advisory Given  Plan Discussed with: Anesthesiologist, CRNA and Surgeon  Anesthesia Plan Comments:        Anesthesia Quick Evaluation

## 2020-01-20 NOTE — Op Note (Signed)
Preoperative diagnosis: Bilateral recurrent inguinal hernia.   Postoperative diagnosis: Bilateral recurrent inguinal hernia.  Procedure: Robotic assisted Laparoscopic Transabdominal preperitoneal laparoscopic (TAPP) repair of Bilateral recurrent inguinal hernia.  Anesthesia: GETA  Surgeon: Dr. Windell Moment  Wound Classification: Clean  Indications:  Patient is a 84 y.o. male developed a recurrent bilateral inguinal hernia. Repair was indicated.  Findings: 1. Bilateral pantaloon Inguinal hernia identified 2. Vas deferens and cord structures identified and preserved 3. Bard 3D Max mesh used for repair 4. Adequate hemostasis.   Description of procedure: The patient was taken to the operating room and the correct side of surgery was verified. The patient was placed supine with arms tucked at the sides. After obtaining adequate anesthesia, the patient's abdomen was prepped and draped in standard sterile fashion. The patient was placed in the Trendelenburg position. A time-out was completed verifying correct patient, procedure, site, positioning, and implant(s) and/or special equipment prior to beginning this procedure. A Veress needle was placed at the umbilicus and pneumoperitoneum created with insufflation of carbon dioxide to 15 mmHg. After the Veress needle was removed, an 8-mm trocar was placed on epigastric area and the 30 angled laparoscope inserted. Two 8-mm trocars were then placed lateral to the rectus sheath under direct visualization. Both inguinal regions were inspected and the median umbilical ligament, medial umbilical ligament, and lateral umbilical fold were identified.  The robotic arms were docked. The robotic scope was inserted and the pelvic area anatomy targeted.  The peritoneum was incised with scissors along a line 5 cm above the superior edge of the hernia defect, extending from the median umbilical ligament to the anterior superior iliac spine. The peritoneal flap was  mobilized inferiorly using blunt and sharp dissection. The inferior epigastric vessels were exposed and the pubic symphysis was identified. Cooper's ligament was dissected to its junction with the iliac vein. The dissection was continued inferiorly to the iliopubic tract, with care taken to avoid injury to the femoral branch of the genitofemoral nerve and the lateral femoral cutaneous nerve. The cord structures were parietalized. The hernia was identified and reduced by gentle traction.  The indirect hernia sac was noted mobilized from the cord structures and reduced into the peritoneal cavity.  A large piece of mesh was rolled longitudinally into a compact cylinder and passed through a trocar. The cylinder was placed along the inferior aspect of the working space and unrolled into place to completely cover the direct, indirect, and femoral spaces. The mesh was secured into place superiorly to the anterior abdominal wall and inferiorly and medially to Cooper's ligament with absorbable sutures. Care was taken to avoid the inferolateral triangles containing the iliac vessels and genital nerves. The peritoneal flap was closed over the mesh and secured with suture in similar positions of safety.  The left hernia was also repaired using the same fashion.  After ensuring adequate hemostasis, the trocars were removed and the pneumoperitoneum allowed to escape. The trocar incisions were closed using monocryl and skin adhesive dressings applied.  The patient tolerated the procedure well and was taken to the postanesthesia care unit in stable condition.   Specimen: None  Complications: None  Estimated Blood Loss: 50 mL

## 2020-01-20 NOTE — Discharge Instructions (Signed)
AMBULATORY SURGERY  DISCHARGE INSTRUCTIONS   1) The drugs that you were given will stay in your system until tomorrow so for the next 24 hours you should not:  A) Drive an automobile B) Make any legal decisions C) Drink any alcoholic beverage   2) You may resume regular meals tomorrow.  Today it is better to start with liquids and gradually work up to solid foods.  You may eat anything you prefer, but it is better to start with liquids, then soup and crackers, and gradually work up to solid foods.   3) Please notify your doctor immediately if you have any unusual bleeding, trouble breathing, redness and pain at the surgery site, drainage, fever, or pain not relieved by medication.    4) Additional Instructions:        Please contact your physician with any problems or Same Day Surgery at [phone], Monday through Friday 6 am to 4 pm, or [facility] number at [phone]. Diet: Resume home heart healthy regular diet.   Activity: No heavy lifting >20 pounds (children, pets, laundry, garbage) or strenuous activity until follow-up, but light activity and walking are encouraged. Do not drive or drink alcohol if taking narcotic pain medications.  Wound care: May shower with soapy water and pat dry (do not rub incisions), but no baths or submerging incision underwater until follow-up. (no swimming)   Medications: Resume all home medications. For mild to moderate pain: acetaminophen (Tylenol) or ibuprofen (if no kidney disease). Combining Tylenol with alcohol can substantially increase your risk of causing liver disease. Narcotic pain medications, if prescribed, can be used for severe pain, though may cause nausea, constipation, and drowsiness. Do not combine Tylenol and Norco within a 6 hour period as Norco contains Tylenol. If you do not need the narcotic pain medication, you do not need to fill the prescription.  Call office ([phone]) at any time if any  questions, worsening pain, fevers/chills, bleeding, drainage from incision site, or other concerns.  

## 2020-01-20 NOTE — Transfer of Care (Signed)
Immediate Anesthesia Transfer of Care Note  Patient: Terry Collins  Procedure(s) Performed: XI ROBOTIC ASSISTED INGUINAL HERNIA REPAIR WITH MESH (Bilateral Groin)  Patient Location: PACU  Anesthesia Type:General  Level of Consciousness: sedated  Airway & Oxygen Therapy: Patient Spontanous Breathing and Patient connected to face mask oxygen  Post-op Assessment: Report given to RN and Post -op Vital signs reviewed and stable  Post vital signs: Reviewed and stable  Last Vitals:  Vitals Value Taken Time  BP 159/88 01/20/20 1530  Temp 36.1 C 01/20/20 1530  Pulse 65 01/20/20 1537  Resp 12 01/20/20 1537  SpO2 98 % 01/20/20 1537  Vitals shown include unvalidated device data.  Last Pain:  Vitals:   01/20/20 1530  PainSc: 0-No pain         Complications: No apparent anesthesia complications

## 2020-01-20 NOTE — Interval H&P Note (Signed)
History and Physical Interval Note:  01/20/2020 11:41 AM  Terry Collins  has presented today for surgery, with the diagnosis of K40.21 Bil recurrent inguinal hernia w/o obstruction or gangrene.  The various methods of treatment have been discussed with the patient and family. After consideration of risks, benefits and other options for treatment, the patient has consented to  Procedure(s): XI ROBOTIC [place] (Bilateral) as a surgical intervention.  The patient's history has been reviewed, patient examined, no change in status, stable for surgery.  I have reviewed the patient's chart and labs.  Questions were answered to the patient's satisfaction.     Herbert Pun

## 2020-01-20 NOTE — Anesthesia Procedure Notes (Signed)
Procedure Name: Intubation Date/Time: 01/20/2020 12:43 PM Performed by: Nelda Marseille, CRNA Pre-anesthesia Checklist: Patient identified, Patient being monitored, Timeout performed, Emergency Drugs available and Suction available Patient Re-evaluated:Patient Re-evaluated prior to induction Oxygen Delivery Method: Circle system utilized Preoxygenation: Pre-oxygenation with 100% oxygen Induction Type: IV induction Ventilation: Mask ventilation without difficulty Laryngoscope Size: Mac, 3 and McGraph Grade View: Grade I Tube type: Oral Tube size: 7.5 mm Number of attempts: 1 Airway Equipment and Method: Stylet Placement Confirmation: ETT inserted through vocal cords under direct vision,  positive ETCO2 and breath sounds checked- equal and bilateral Secured at: 21 cm Tube secured with: Tape Dental Injury: Teeth and Oropharynx as per pre-operative assessment

## 2020-01-21 NOTE — Anesthesia Postprocedure Evaluation (Signed)
Anesthesia Post Note  Patient: Terry Collins  Procedure(s) Performed: XI ROBOTIC ASSISTED INGUINAL HERNIA REPAIR WITH MESH (Bilateral Groin)  Patient location during evaluation: PACU Anesthesia Type: General Level of consciousness: awake and alert Pain management: pain level controlled Vital Signs Assessment: post-procedure vital signs reviewed and stable Respiratory status: spontaneous breathing, nonlabored ventilation and respiratory function stable Cardiovascular status: blood pressure returned to baseline and stable Postop Assessment: no apparent nausea or vomiting Anesthetic complications: no     Last Vitals:  Vitals:   01/20/20 1635 01/20/20 1700  BP: (!) 155/71 (!) 145/78  Pulse: 71 72  Resp: 14 15  Temp: 36.4 C (!) 36.4 C  SpO2: 97% 97%    Last Pain:  Vitals:   01/20/20 1700  TempSrc:   PainSc: 0-No pain                 Brett Canales Shavy Beachem

## 2020-03-18 ENCOUNTER — Ambulatory Visit
Admission: RE | Admit: 2020-03-18 | Discharge: 2020-03-18 | Disposition: A | Discharge status: Home / Self Care | Payer: Medicare Other | Source: Ambulatory Visit | Attending: Cardiology | Admitting: Cardiology

## 2020-03-18 ENCOUNTER — Other Ambulatory Visit: Payer: Self-pay

## 2020-03-18 ENCOUNTER — Other Ambulatory Visit: Payer: Self-pay | Admitting: Cardiology

## 2020-03-18 ENCOUNTER — Other Ambulatory Visit (HOSPITAL_COMMUNITY): Payer: Self-pay | Admitting: Cardiology

## 2020-03-18 DIAGNOSIS — R600 Localized edema: Secondary | ICD-10-CM

## 2020-04-30 DIAGNOSIS — I700 Atherosclerosis of aorta: Secondary | ICD-10-CM | POA: Insufficient documentation

## 2020-06-23 ENCOUNTER — Other Ambulatory Visit: Payer: Self-pay | Admitting: Orthopedic Surgery

## 2020-07-07 ENCOUNTER — Other Ambulatory Visit: Payer: Self-pay

## 2020-07-07 ENCOUNTER — Encounter
Admission: RE | Admit: 2020-07-07 | Discharge: 2020-07-07 | Disposition: A | Discharge status: Home / Self Care | Payer: Medicare Other | Source: Ambulatory Visit | Attending: Orthopedic Surgery | Admitting: Orthopedic Surgery

## 2020-07-07 ENCOUNTER — Ambulatory Visit
Admission: RE | Admit: 2020-07-07 | Discharge: 2020-07-07 | Disposition: A | Discharge status: Home / Self Care | Payer: Medicare Other | Source: Ambulatory Visit | Attending: Orthopedic Surgery | Admitting: Orthopedic Surgery

## 2020-07-07 DIAGNOSIS — R918 Other nonspecific abnormal finding of lung field: Secondary | ICD-10-CM | POA: Diagnosis not present

## 2020-07-07 DIAGNOSIS — I517 Cardiomegaly: Secondary | ICD-10-CM | POA: Diagnosis not present

## 2020-07-07 DIAGNOSIS — Z01818 Encounter for other preprocedural examination: Secondary | ICD-10-CM | POA: Insufficient documentation

## 2020-07-07 DIAGNOSIS — I700 Atherosclerosis of aorta: Secondary | ICD-10-CM | POA: Insufficient documentation

## 2020-07-07 DIAGNOSIS — Z01811 Encounter for preprocedural respiratory examination: Secondary | ICD-10-CM | POA: Insufficient documentation

## 2020-07-07 LAB — HEMOGLOBIN A1C
Hgb A1c MFr Bld: 5.4 % (ref 4.8–5.6)
Mean Plasma Glucose: 108.28 mg/dL

## 2020-07-07 LAB — URINALYSIS, COMPLETE (UACMP) WITH MICROSCOPIC
Bilirubin Urine: NEGATIVE
Glucose, UA: NEGATIVE mg/dL
Hgb urine dipstick: NEGATIVE
Ketones, ur: NEGATIVE mg/dL
Leukocytes,Ua: NEGATIVE
Nitrite: NEGATIVE
Protein, ur: NEGATIVE mg/dL
Specific Gravity, Urine: 1.021 (ref 1.005–1.030)
pH: 5 (ref 5.0–8.0)

## 2020-07-07 LAB — TYPE AND SCREEN
ABO/RH(D): O POS
Antibody Screen: NEGATIVE

## 2020-07-07 LAB — PROTIME-INR
INR: 1.3 — ABNORMAL HIGH (ref 0.8–1.2)
Prothrombin Time: 15.4 seconds — ABNORMAL HIGH (ref 11.4–15.2)

## 2020-07-07 LAB — APTT: aPTT: 50 seconds — ABNORMAL HIGH (ref 24–36)

## 2020-07-07 NOTE — Patient Instructions (Signed)
Your procedure is scheduled on: Thurs 11/18 Report to Registration desk in medical mall    Then to 2nd floor surgery desk. To find out your arrival time please call [phone] between 1PM - 3PM on Wed. 11/17.  Remember: Instructions that are not followed completely may result in serious medical risk,  up to and including death, or upon the discretion of your surgeon and anesthesiologist your  surgery may need to be rescheduled.     _X__ 1. Do not eat food after midnight the night before your procedure.                 No chewing gum or hard candies. You may drink clear liquids up to 2 hours                 before you are scheduled to arrive for your surgery- DO not drink clear                 liquids within 2 hours of the start of your surgery.                 Clear Liquids include:  water, apple juice without pulp, clear Gatorade, G2 or                  Gatorade Zero (avoid Red/Purple/Blue), Black Coffee or Tea (Do not add                 anything to coffee or tea). _____2.   Complete the "Ensure Clear Pre-surgery Clear Carbohydrate Drink" provided to you, 2 hours before arrival. **If you       are diabetic you will be provided with an alternative drink, Gatorade Zero or G2.  __X__2.  On the morning of surgery brush your teeth with toothpaste and water, you                may rinse your mouth with mouthwash if you wish.  Do not swallow any toothpaste of mouthwash.     _X__ 3.  No Alcohol for 24 hours before or after surgery.   ___ 4.  Do Not Smoke or use e-cigarettes For 24 Hours Prior to Your Surgery.                 Do not use any chewable tobacco products for at least 6 hours prior to                 Surgery.  ___  5.  Do not use any recreational drugs (marijuana, cocaine, heroin, ecstasy, MDMA or other)                For at least one week prior to your surgery.  Combination of these drugs with anesthesia                May have life threatening  results.  ____  6.  Bring all medications with you on the day of surgery if instructed.   _x___  7.  Notify your doctor if there is any change in your medical condition      (cold, fever, infections).     Do not wear jewelry, Do not wear lotions,  You may wear deodorant. Do not shave 48 hours prior to surgery. Men may shave face and neck. Do not bring valuables to the hospital.    [facility] is not responsible for any belongings or valuables.  Contacts, dentures or bridgework may not be worn  into surgery. Leave your suitcase in the car. After surgery it may be brought to your room. For patients admitted to the hospital, discharge time is determined by your treatment team.   Patients discharged the day of surgery will not be allowed to drive home.   Make arrangements for someone to be with you for the first 24 hours of your Same Day Discharge.    Please read over the following fact sheets that you were given:    _x___ Take these medicines the morning of surgery with A SIP OF WATER:    1. cycloSPORINE (RESTASIS) 0.05 % ophthalmic emulsion  2. dofetilide (TIKOSYN) 125 MCG capsule  3.   4.  5.  6.  ____ Fleet Enema (as directed)   _x___ Use CHG Soap (or wipes) as directed  ____ Use Benzoyl Peroxide Gel as instructed  ____ Use inhalers on the day of surgery  ____ Stop metformin 2 days prior to surgery    ____ Take 1/2 of usual insulin dose the night before surgery. No insulin the morning          of surgery.   __x__ Stop Pradaxa on Tues 11/15    Last dose on 11/15   __x__ No  Anti-inflammatories   May take tylenol   ____ Stop supplements until after surgery.    ____ Bring C-Pap to the hospital.    If you have any questions regarding your pre-procedure instructions,  Please call Pre-admit Testing at [place]

## 2020-07-10 ENCOUNTER — Encounter: Payer: Self-pay | Admitting: Orthopedic Surgery

## 2020-07-10 NOTE — Progress Notes (Signed)
[facility] Perioperative Services  Pre-Admission/Anesthesia Testing Clinical Review  Date: 07/10/20  Patient Demographics:  Name: Terry Collins DOB:   02/10/1936 MRN:   [id number]  Planned Surgical Procedure(s):    Case: [id number] Date/Time: 07/16/20 0730   Procedure: RIGHT TOTAL KNEE ARTHROPLASTY (Right Knee)   Anesthesia type: Choice   Pre-op diagnosis: Right Knee Osteoarthritis   Location: [facility] OR ROOM 02 / [place] ORS FOR ANESTHESIA GROUP   Surgeons: Thornton Park, MD     NOTE: Available PAT nursing documentation and vital signs have been reviewed. Clinical nursing staff has updated patient's PMH/PSHx, current medication list, and drug allergies/intolerances to ensure comprehensive history available to assist in medical decision making as it pertains to the aforementioned surgical procedure and anticipated anesthetic course.   Clinical Discussion:  Terry Collins is a 84 y.o. male who is submitted for pre-surgical anesthesia review and clearance prior to him undergoing the above procedure. Patient has never been a smoker. Pertinent PMH includes: paroxysmal atrial fibrillation (s/p ablation and 09/2017), aortic valve insufficiency, aortic atherosclerosis, carotid stenosis, HLD, OA.  Patient is followed by cardiology Nehemiah Massed, MD). He was last seen in the cardiology clinic on 06/30/2020; notes reviewed.  At the time of patient's clinic visit, patient reported to be "stable from a cardiovascular standpoint".  Patient reported that he had been doing "pretty good" at home.  He denied any chest pain, shortness of breath, PND, orthopnea, palpitations, vertiginous symptoms, or presyncope/syncope.  Functional capacity unable to be accurately determined due to patient's orthopedic issues.  Exam revealed chronic peripheral edema that was noted to be at baseline.  Patient with past medical history (+) for paroxysmal A. fib.  He underwent ablation in 09/2017.  Patient with known  bilateral carotid artery stenosis (<50%) noted during last imaging performed in 2016.  CHA2DS2-VASc Score = 3 - 4 representing a 3.2% - 4.8% annual stroke risk in the absence of anticoagulation therapy.  Patient currently on dofetilide and chronically anticoagulated using dabigatran.  Patient denied any stigmata of bleeding.  ECG performed in the office on 06/15/2020 revealed sinus rhythm with a first-degree AV block with PACs at a rate of 68 bpm.  Blood pressure well controlled.  HLD managed with lifestyle modifications alone.  TTE performed on 04/22/2020 revealed normal left ventricular systolic function with an LVEF of >55%.  Subsequent myocardial perfusion imaging performed on 06/26/2020 revealed no evidence of stress-induced myocardial ischemia or arrhythmia (see full interpretation of cardiovascular testing below.  Patient is scheduled to follow-up with outpatient cardiology in 3 months.  Patient is scheduled to undergo an elective total knee arthroplasty on 07/16/2020 with Dr. Thornton Park.  Given patient's past medical history significant for cardiovascular issues, presurgical cardiac clearance was sought by the PAT team.  Per cardiology, "this is considered and INTERMEDIATE cardiovascular risk surgery.  It is unclear how many minutes he is able to achieve related to his orthopedic debility.  There is no apparent recent history of heart failure or overt cardiovascular disease.  Patient is optimized from a cardiology standpoint for the planned surgery". This patient is on daily anticoagulation therapy. He has been instructed on recommendations for holding his dabigatran for 2 days prior to his procedure. The patient has been instructed that his last dose of his anticoagulant will be on 07/13/2020.  He denies previous perioperative complications with anesthesia. He underwent a general anesthetic course here (ASA II) in 12/2019 with no documented complications.   Vitals with BMI 07/07/2020 01/20/2020  01/20/2020  Height - - -  Weight - - -  BMI - - -  Systolic [phone]  Diastolic 96 78 71  Pulse 61 72 71    Providers/Specialists:   NOTE: Primary physician provider listed below. Patient may have been seen by APP or partner within same practice.   PROVIDER ROLE LAST Larey Seat, MD Orthopedic (Surgeon) 06/08/2020  Tracie Harrier, MD Primary Care Provider 04/30/2020  [name], MD Cardiology 06/30/2020   Allergies:  Patient has no known allergies.  Current Home Medications:   No current facility-administered medications for this encounter.   . cycloSPORINE (RESTASIS) 0.05 % ophthalmic emulsion  . dabigatran (PRADAXA) 150 MG CAPS capsule  . dofetilide (TIKOSYN) 125 MCG capsule  . ferrous sulfate 325 (65 FE) MG tablet  . folic acid (FOLVITE) 710 MCG tablet  . metroNIDAZOLE (METROGEL) 1 % gel  . Multiple Vitamin (MULTIVITAMIN WITH MINERALS) TABS tablet  . NOCDURNA 55.3 MCG SUBL  . potassium chloride (KLOR-CON SPRINKLE) 10 MEQ CR capsule  . vitamin B-12 (CYANOCOBALAMIN) 1000 MCG tablet   History:   Past Medical History:  Diagnosis Date  . Anemia   . Aortic valve insufficiency   . Arthritis    hands  . Atherosclerosis of aorta ([place])   . Cancer ([place])    skin precancerous areas have been removed  . Carotid stenosis   . History of kidney stones 2010  . Mixed hyperlipidemia   . Paroxysmal atrial fibrillation ([facility])   . Venous insufficiency of both lower extremities    Past Surgical History:  Procedure Laterality Date  . CARDIAC ELECTROPHYSIOLOGY STUDY AND ABLATION  2019   [facility]  . EYE SURGERY Bilateral 2012   cataracts  . HERNIA REPAIR Left 2008   inguinal  . INGUINAL HERNIA REPAIR Right 06/24/2016   Procedure: HERNIA REPAIR INGUINAL ADULT;  Surgeon: Leonie Green, MD;  Location: [facility] ORS;  Service: General;  Laterality: Right;  . XI ROBOTIC ASSISTED INGUINAL HERNIA REPAIR WITH MESH Bilateral 01/20/2020   Procedure: XI ROBOTIC ASSISTED  INGUINAL HERNIA REPAIR WITH MESH;  Surgeon: Herbert Pun, MD;  Location: [facility] ORS;  Service: General;  Laterality: Bilateral;   No family history on file. Social History   Tobacco Use  . Smoking status: Never Smoker  . Smokeless tobacco: Never Used  Vaping Use  . Vaping Use: Never assessed  Substance Use Topics  . Alcohol use: Yes    Alcohol/week: 1.0 standard drink    Types: 1 Glasses of wine per week    Comment: glass of wine week  . Drug use: No    Pertinent Clinical Results:  LABS: Labs reviewed: Acceptable for surgery.  Marland Kitchen    No visits with results within 3 Day(s) from this visit.  Latest known visit with results is:  Hospital Outpatient Visit on 07/07/2020  Component Date Value Ref Range Status  . aPTT 07/07/2020 50* 24 - 36 seconds Final   Comment:        IF BASELINE aPTT IS ELEVATED, SUGGEST PATIENT RISK ASSESSMENT BE USED TO DETERMINE APPROPRIATE ANTICOAGULANT THERAPY. Performed at [facility], [address], [place], [place] [address]   . Hgb A1c MFr Bld 07/07/2020 5.4  4.8 - 5.6 % Final   Comment: (NOTE) Pre diabetes:          5.7%-6.4%  Diabetes:              >6.4%  Glycemic control for   <7.0% adults with diabetes   . Mean Plasma Glucose 07/07/2020  108.28  mg/dL Final   Performed at [facility] [address] [place], [place] [address]  . Prothrombin Time 07/07/2020 15.4* 11.4 - 15.2 seconds Final  . INR 07/07/2020 1.3* 0.8 - 1.2 Final   Comment: (NOTE) INR goal varies based on device and disease states. Performed at [facility], [address], [place], [place] [address]   . ABO/RH(D) 07/07/2020 O POS   Final  . Antibody Screen 07/07/2020 NEG   Final  . Sample Expiration 07/07/2020 07/21/2020,2359   Final  . Extend sample reason 07/07/2020    Final                   Value:NO TRANSFUSIONS OR PREGNANCY IN THE PAST 3 MONTHS Performed at [facility], [address] [place], [place] [address]     . Color, Urine 07/07/2020 YELLOW* YELLOW Final  . APPearance 07/07/2020 HAZY* CLEAR Final  . Specific Gravity, Urine 07/07/2020 1.021  1.005 - 1.030 Final  . pH 07/07/2020 5.0  5.0 - 8.0 Final  . Glucose, UA 07/07/2020 NEGATIVE  NEGATIVE mg/dL Final  . Hgb urine dipstick 07/07/2020 NEGATIVE  NEGATIVE Final  . Bilirubin Urine 07/07/2020 NEGATIVE  NEGATIVE Final  . Ketones, ur 07/07/2020 NEGATIVE  NEGATIVE mg/dL Final  . Protein, ur 07/07/2020 NEGATIVE  NEGATIVE mg/dL Final  . Nitrite 07/07/2020 NEGATIVE  NEGATIVE Final  . Chalmers Guest 07/07/2020 NEGATIVE  NEGATIVE Final  . RBC / HPF 07/07/2020 6-10  0 - 5 RBC/hpf Final  . WBC, UA 07/07/2020 0-5  0 - 5 WBC/hpf Final  . Bacteria, UA 07/07/2020 RARE* NONE SEEN Final  . Squamous Epithelial / LPF 07/07/2020 0-5  0 - 5 Final  . Mucus 07/07/2020 PRESENT   Final   Performed at [facility], [address] [place], [place] [address]    ECG: Date: 06/15/2020 Rate: 68 bpm Rhythm: Sinus rhythm with first-degree AV block with PACs; IRBBB; LAFB Intervals: PR 232 ms. QRS 118 ms. QTc 469 ms. ST segment and T wave changes: No evidence of acute ST segment elevation or depression Comparison: When compared to tracing from 05/07/2020, PACs are now noted. NOTE: Tracing obtained at [facility]; unable for review. Above based on cardiologist's interpretation.   IMAGING / PROCEDURES: LEXISCAN done on 06/26/2020 1. LVEF 52% 2. Regional wall motion reveals normal myocardial thickening and wall motion 3. There are no artifacts noted 4. Left ventricular cavity is normal 5. There is no evidence of stress-induced myocardial ischemia or arrhythmia 6. The overall quality of study is good  ECHOCARDIOGRAM done on 04/22/2020 1. LVEF >55% 2. Normal left ventricular systolic function 3. Normal right ventricular systolic function 4. Moderate AR; mild MR, TR; trivial PR 5. There is no valvular stenosis noted  Impression and Plan:  Terry Collins  has been referred for pre-anesthesia review and clearance prior to him undergoing the planned anesthetic and procedural courses. Available labs, pertinent testing, and imaging results were personally reviewed by me. This patient has been appropriately cleared by cardiology with a MODERATE risk stratification.  Based on clinical review performed today (07/10/20), barring any significant acute changes in the patient's overall condition, it is anticipated that he will be able to proceed with the planned surgical intervention. Any acute changes in clinical condition may necessitate his procedure being postponed and/or cancelled. Pre-surgical instructions were reviewed with the patient during his PAT appointment and questions were fielded by PAT clinical staff.  Honor Loh, MSN, APRN, FNP-C, [facility]  Peri-operative Services Nurse Practitioner Phone: [phone] 07/10/20 3:08 PM  NOTE: This note has been prepared using Dragon dictation software. Despite my best ability to proofread, there is always the potential that unintentional transcriptional errors may still occur from this process.

## 2020-07-14 ENCOUNTER — Other Ambulatory Visit: Payer: Self-pay

## 2020-07-14 ENCOUNTER — Other Ambulatory Visit
Admission: RE | Admit: 2020-07-14 | Discharge: 2020-07-14 | Disposition: A | Discharge status: Home / Self Care | Payer: Medicare Other | Source: Ambulatory Visit | Attending: Orthopedic Surgery | Admitting: Orthopedic Surgery

## 2020-07-14 DIAGNOSIS — Z20822 Contact with and (suspected) exposure to covid-19: Secondary | ICD-10-CM | POA: Insufficient documentation

## 2020-07-14 DIAGNOSIS — Z01812 Encounter for preprocedural laboratory examination: Secondary | ICD-10-CM | POA: Insufficient documentation

## 2020-07-15 LAB — SARS CORONAVIRUS 2 (TAT 6-24 HRS): SARS Coronavirus 2: NEGATIVE

## 2020-07-15 MED ORDER — CHLORHEXIDINE GLUCONATE 0.12 % MT SOLN: 15.0000 mL | Freq: Once | OROMUCOSAL | Status: AC

## 2020-07-15 MED ORDER — ORAL CARE MOUTH RINSE: 15.0000 mL | Freq: Once | OROMUCOSAL | Status: AC

## 2020-07-15 MED ORDER — LACTATED RINGERS IV SOLN: INTRAVENOUS | Status: DC

## 2020-07-15 MED ORDER — CLINDAMYCIN PHOSPHATE 600 MG/50ML IV SOLN
600.0000 mg | Freq: Once | INTRAVENOUS | Status: AC
  Administered 2020-07-16: 600 mg via INTRAVENOUS

## 2020-07-15 MED ORDER — FAMOTIDINE 20 MG PO TABS: 20.0000 mg | ORAL_TABLET | Freq: Once | ORAL | Status: AC

## 2020-07-15 MED ORDER — CEFAZOLIN SODIUM-DEXTROSE 2-4 GM/100ML-% IV SOLN
2.0000 g | INTRAVENOUS | Status: AC
  Administered 2020-07-16: 2 g via INTRAVENOUS

## 2020-07-15 MED ORDER — TRANEXAMIC ACID-NACL 1000-0.7 MG/100ML-% IV SOLN
1000.0000 mg | INTRAVENOUS | Status: AC
  Administered 2020-07-16: 1000 mg via INTRAVENOUS

## 2020-07-15 MED ORDER — ACETAMINOPHEN 500 MG PO TABS: 1000.0000 mg | ORAL_TABLET | ORAL | Status: AC

## 2020-07-16 ENCOUNTER — Inpatient Hospital Stay: Payer: Medicare Other | Admitting: Urgent Care

## 2020-07-16 ENCOUNTER — Encounter
Admission: RE | Disposition: A | Discharge status: Skilled Nursing Facility | Payer: Self-pay | Source: Home / Self Care | Attending: Orthopedic Surgery

## 2020-07-16 ENCOUNTER — Encounter: Payer: Self-pay | Admitting: Orthopedic Surgery

## 2020-07-16 ENCOUNTER — Inpatient Hospital Stay: Payer: Medicare Other

## 2020-07-16 ENCOUNTER — Inpatient Hospital Stay
Admission: RE | Admit: 2020-07-16 | Discharge: 2020-07-21 | DRG: 469 | Disposition: A | Discharge status: Skilled Nursing Facility | Payer: Medicare Other | Attending: Orthopedic Surgery | Admitting: Orthopedic Surgery

## 2020-07-16 ENCOUNTER — Other Ambulatory Visit: Payer: Self-pay

## 2020-07-16 DIAGNOSIS — Z79899 Other long term (current) drug therapy: Secondary | ICD-10-CM | POA: Diagnosis not present

## 2020-07-16 DIAGNOSIS — I739 Peripheral vascular disease, unspecified: Secondary | ICD-10-CM | POA: Diagnosis present

## 2020-07-16 DIAGNOSIS — Z7901 Long term (current) use of anticoagulants: Secondary | ICD-10-CM | POA: Diagnosis not present

## 2020-07-16 DIAGNOSIS — Z20822 Contact with and (suspected) exposure to covid-19: Secondary | ICD-10-CM | POA: Diagnosis present

## 2020-07-16 DIAGNOSIS — I480 Paroxysmal atrial fibrillation: Secondary | ICD-10-CM | POA: Diagnosis present

## 2020-07-16 DIAGNOSIS — D72829 Elevated white blood cell count, unspecified: Secondary | ICD-10-CM | POA: Diagnosis present

## 2020-07-16 DIAGNOSIS — I872 Venous insufficiency (chronic) (peripheral): Secondary | ICD-10-CM | POA: Diagnosis present

## 2020-07-16 DIAGNOSIS — G928 Other toxic encephalopathy: Secondary | ICD-10-CM | POA: Diagnosis not present

## 2020-07-16 DIAGNOSIS — F05 Delirium due to known physiological condition: Secondary | ICD-10-CM | POA: Diagnosis not present

## 2020-07-16 DIAGNOSIS — M25561 Pain in right knee: Secondary | ICD-10-CM | POA: Diagnosis present

## 2020-07-16 DIAGNOSIS — I452 Bifascicular block: Secondary | ICD-10-CM | POA: Diagnosis present

## 2020-07-16 DIAGNOSIS — Z96651 Presence of right artificial knee joint: Secondary | ICD-10-CM

## 2020-07-16 DIAGNOSIS — Z87442 Personal history of urinary calculi: Secondary | ICD-10-CM

## 2020-07-16 DIAGNOSIS — I10 Essential (primary) hypertension: Secondary | ICD-10-CM | POA: Diagnosis present

## 2020-07-16 DIAGNOSIS — M19041 Primary osteoarthritis, right hand: Secondary | ICD-10-CM | POA: Diagnosis present

## 2020-07-16 DIAGNOSIS — M1711 Unilateral primary osteoarthritis, right knee: Principal | ICD-10-CM | POA: Diagnosis present

## 2020-07-16 DIAGNOSIS — G9341 Metabolic encephalopathy: Secondary | ICD-10-CM | POA: Diagnosis not present

## 2020-07-16 DIAGNOSIS — M19042 Primary osteoarthritis, left hand: Secondary | ICD-10-CM | POA: Diagnosis present

## 2020-07-16 DIAGNOSIS — Z8249 Family history of ischemic heart disease and other diseases of the circulatory system: Secondary | ICD-10-CM

## 2020-07-16 DIAGNOSIS — D649 Anemia, unspecified: Secondary | ICD-10-CM | POA: Diagnosis not present

## 2020-07-16 DIAGNOSIS — E782 Mixed hyperlipidemia: Secondary | ICD-10-CM | POA: Diagnosis present

## 2020-07-16 DIAGNOSIS — T40425A Adverse effect of tramadol, initial encounter: Secondary | ICD-10-CM | POA: Diagnosis not present

## 2020-07-16 HISTORY — PX: TOTAL KNEE ARTHROPLASTY: SHX125

## 2020-07-16 HISTORY — DX: Occlusion and stenosis of unspecified carotid artery: I65.29

## 2020-07-16 HISTORY — DX: Paroxysmal atrial fibrillation: I48.0

## 2020-07-16 HISTORY — DX: Venous insufficiency (chronic) (peripheral): I87.2

## 2020-07-16 HISTORY — DX: Nonrheumatic aortic (valve) insufficiency: I35.1

## 2020-07-16 HISTORY — DX: Mixed hyperlipidemia: E78.2

## 2020-07-16 HISTORY — DX: Atherosclerosis of aorta: I70.0

## 2020-07-16 LAB — CREATININE, SERUM
Creatinine, Ser: 0.99 mg/dL (ref 0.61–1.24)
GFR, Estimated: >60 mL/min (ref >60)

## 2020-07-16 LAB — MAGNESIUM: Magnesium: 2 mg/dL (ref 1.7–2.4)

## 2020-07-16 LAB — POTASSIUM: Potassium: 4 mmol/L (ref 3.5–5.1)

## 2020-07-16 LAB — ABO/RH: ABO/RH(D): O POS

## 2020-07-16 SURGERY — ARTHROPLASTY, KNEE, TOTAL
Anesthesia: Spinal | Site: Knee | Laterality: Right

## 2020-07-16 MED ORDER — PROPOFOL 500 MG/50ML IV EMUL
INTRAVENOUS | Status: AC
  Filled 2020-07-16: qty 50

## 2020-07-16 MED ORDER — MORPHINE SULFATE 4 MG/ML IJ SOLN
INTRAMUSCULAR | Status: DC | PRN
  Administered 2020-07-16: 4 mg via INTRAMUSCULAR

## 2020-07-16 MED ORDER — DEXAMETHASONE SODIUM PHOSPHATE 10 MG/ML IJ SOLN
INTRAMUSCULAR | Status: AC
  Filled 2020-07-16: qty 1

## 2020-07-16 MED ORDER — ONDANSETRON HCL 4 MG/2ML IJ SOLN
4.0000 mg | Freq: Four times a day (QID) | INTRAMUSCULAR | Status: DC | PRN
  Administered 2020-07-18: 4 mg via INTRAVENOUS
  Filled 2020-07-16: qty 2

## 2020-07-16 MED ORDER — METHOCARBAMOL 1000 MG/10ML IJ SOLN
500.0000 mg | Freq: Four times a day (QID) | INTRAVENOUS | Status: DC | PRN
  Filled 2020-07-16: qty 5

## 2020-07-16 MED ORDER — POTASSIUM CHLORIDE ER 10 MEQ PO TBCR
10.0000 meq | EXTENDED_RELEASE_TABLET | Freq: Every day | ORAL | Status: DC
  Administered 2020-07-17 – 2020-07-20 (×4): 10 meq via ORAL
  Filled 2020-07-16 (×10): qty 1

## 2020-07-16 MED ORDER — FENTANYL CITRATE (PF) 100 MCG/2ML IJ SOLN: 25.0000 ug | INTRAMUSCULAR | Status: DC | PRN

## 2020-07-16 MED ORDER — MORPHINE SULFATE (PF) 2 MG/ML IV SOLN: 1.0000 mg | INTRAVENOUS | Status: DC | PRN

## 2020-07-16 MED ORDER — DOFETILIDE 125 MCG PO CAPS
125.0000 ug | ORAL_CAPSULE | Freq: Two times a day (BID) | ORAL | Status: DC
  Administered 2020-07-16 – 2020-07-21 (×9): 125 ug via ORAL
  Filled 2020-07-16 (×12): qty 1

## 2020-07-16 MED ORDER — ADULT MULTIVITAMIN W/MINERALS CH
1.0000 | ORAL_TABLET | Freq: Every day | ORAL | Status: DC
  Administered 2020-07-17 – 2020-07-21 (×5): 1 via ORAL
  Filled 2020-07-16 (×5): qty 1

## 2020-07-16 MED ORDER — METHOCARBAMOL 500 MG PO TABS: 500.0000 mg | ORAL_TABLET | Freq: Four times a day (QID) | ORAL | Status: DC | PRN

## 2020-07-16 MED ORDER — FAMOTIDINE 20 MG PO TABS
ORAL_TABLET | ORAL | Status: AC
  Administered 2020-07-16: 20 mg via ORAL
  Filled 2020-07-16: qty 1

## 2020-07-16 MED ORDER — CLINDAMYCIN PHOSPHATE 600 MG/50ML IV SOLN
INTRAVENOUS | Status: AC
  Filled 2020-07-16: qty 50

## 2020-07-16 MED ORDER — HYDROCODONE-ACETAMINOPHEN 5-325 MG PO TABS
1.0000 | ORAL_TABLET | ORAL | Status: DC | PRN
  Administered 2020-07-16: 2 via ORAL
  Administered 2020-07-18: 1 via ORAL
  Filled 2020-07-16: qty 2
  Filled 2020-07-16: qty 1

## 2020-07-16 MED ORDER — ONDANSETRON HCL 4 MG PO TABS: 4.0000 mg | ORAL_TABLET | Freq: Four times a day (QID) | ORAL | Status: DC | PRN

## 2020-07-16 MED ORDER — ACETAMINOPHEN 500 MG PO TABS
ORAL_TABLET | ORAL | Status: AC
  Administered 2020-07-16: 1000 mg via ORAL
  Filled 2020-07-16: qty 2

## 2020-07-16 MED ORDER — SODIUM CHLORIDE 0.9 % IV SOLN
INTRAVENOUS | Status: DC | PRN
  Administered 2020-07-16: 10 ug/min via INTRAVENOUS

## 2020-07-16 MED ORDER — GLYCOPYRROLATE 0.2 MG/ML IJ SOLN
INTRAMUSCULAR | Status: AC
  Filled 2020-07-16: qty 1

## 2020-07-16 MED ORDER — PROPOFOL 10 MG/ML IV BOLUS
INTRAVENOUS | Status: DC | PRN
  Administered 2020-07-16: 20 mg via INTRAVENOUS

## 2020-07-16 MED ORDER — NEOMYCIN-POLYMYXIN B GU 40-200000 IR SOLN
Status: DC | PRN
  Administered 2020-07-16: 16 mL

## 2020-07-16 MED ORDER — DOCUSATE SODIUM 100 MG PO CAPS
100.0000 mg | ORAL_CAPSULE | Freq: Two times a day (BID) | ORAL | Status: DC
  Administered 2020-07-16 – 2020-07-21 (×10): 100 mg via ORAL
  Filled 2020-07-16 (×10): qty 1

## 2020-07-16 MED ORDER — FERROUS SULFATE 325 (65 FE) MG PO TABS
325.0000 mg | ORAL_TABLET | Freq: Every day | ORAL | Status: DC
  Administered 2020-07-17 – 2020-07-21 (×5): 325 mg via ORAL
  Filled 2020-07-16 (×5): qty 1

## 2020-07-16 MED ORDER — DEXAMETHASONE SODIUM PHOSPHATE 10 MG/ML IJ SOLN
INTRAMUSCULAR | Status: DC | PRN
  Administered 2020-07-16: 10 mg via INTRAVENOUS

## 2020-07-16 MED ORDER — FENTANYL CITRATE (PF) 100 MCG/2ML IJ SOLN
INTRAMUSCULAR | Status: DC | PRN
  Administered 2020-07-16: 25 ug via INTRAVENOUS

## 2020-07-16 MED ORDER — ONDANSETRON HCL 4 MG/2ML IJ SOLN
INTRAMUSCULAR | Status: DC | PRN
  Administered 2020-07-16: 4 mg via INTRAVENOUS

## 2020-07-16 MED ORDER — SODIUM CHLORIDE 0.9 % IV SOLN: INTRAVENOUS | Status: DC

## 2020-07-16 MED ORDER — BISACODYL 5 MG PO TBEC: 10.0000 mg | DELAYED_RELEASE_TABLET | Freq: Every day | ORAL | Status: DC | PRN

## 2020-07-16 MED ORDER — BUPIVACAINE-EPINEPHRINE 0.25% -1:200000 IJ SOLN
INTRAMUSCULAR | Status: DC | PRN
  Administered 2020-07-16: 60 mL

## 2020-07-16 MED ORDER — PROPOFOL 10 MG/ML IV BOLUS
INTRAVENOUS | Status: AC
  Filled 2020-07-16: qty 20

## 2020-07-16 MED ORDER — DESMOPRESSIN ACETATE 55.3 MCG SL SUBL
55.3000 ug | SUBLINGUAL_TABLET | Freq: Every day | SUBLINGUAL | Status: DC
  Administered 2020-07-18 – 2020-07-20 (×3): 55.3 ug via SUBLINGUAL
  Filled 2020-07-16 (×3): qty 1

## 2020-07-16 MED ORDER — TRAMADOL HCL 50 MG PO TABS
50.0000 mg | ORAL_TABLET | Freq: Four times a day (QID) | ORAL | Status: DC
  Administered 2020-07-16 – 2020-07-19 (×12): 50 mg via ORAL
  Filled 2020-07-16 (×13): qty 1

## 2020-07-16 MED ORDER — LIDOCAINE HCL (PF) 1 % IJ SOLN
INTRAMUSCULAR | Status: DC | PRN
  Administered 2020-07-16: 3 mL via SUBCUTANEOUS

## 2020-07-16 MED ORDER — CYCLOSPORINE 0.05 % OP EMUL
1.0000 [drp] | Freq: Two times a day (BID) | OPHTHALMIC | Status: DC
  Administered 2020-07-16 – 2020-07-21 (×10): 1 [drp] via OPHTHALMIC
  Filled 2020-07-16 (×11): qty 1

## 2020-07-16 MED ORDER — BUPIVACAINE HCL (PF) 0.5 % IJ SOLN
INTRAMUSCULAR | Status: DC | PRN
  Administered 2020-07-16: 3 mL

## 2020-07-16 MED ORDER — ACETAMINOPHEN 325 MG PO TABS: 325.0000 mg | ORAL_TABLET | Freq: Four times a day (QID) | ORAL | Status: DC | PRN

## 2020-07-16 MED ORDER — DABIGATRAN ETEXILATE MESYLATE 150 MG PO CAPS
150.0000 mg | ORAL_CAPSULE | Freq: Two times a day (BID) | ORAL | Status: DC
  Administered 2020-07-17 – 2020-07-21 (×9): 150 mg via ORAL
  Filled 2020-07-16 (×10): qty 1

## 2020-07-16 MED ORDER — GLYCOPYRROLATE 0.2 MG/ML IJ SOLN
INTRAMUSCULAR | Status: DC | PRN
  Administered 2020-07-16 (×2): .1 mg via INTRAVENOUS

## 2020-07-16 MED ORDER — SODIUM CHLORIDE 0.9 % IV SOLN
INTRAVENOUS | Status: DC | PRN
  Administered 2020-07-16: 60 mL

## 2020-07-16 MED ORDER — VITAMIN B-12 1000 MCG PO TABS
1000.0000 ug | ORAL_TABLET | Freq: Every day | ORAL | Status: DC
  Administered 2020-07-17 – 2020-07-21 (×5): 1000 ug via ORAL
  Filled 2020-07-16 (×5): qty 1

## 2020-07-16 MED ORDER — FOLIC ACID 400 MCG PO TABS
800.0000 ug | ORAL_TABLET | Freq: Every day | ORAL | Status: DC
  Administered 2020-07-17: 800 ug via ORAL
  Filled 2020-07-16 (×3): qty 2

## 2020-07-16 MED ORDER — ONDANSETRON HCL 4 MG/2ML IJ SOLN
INTRAMUSCULAR | Status: AC
  Filled 2020-07-16: qty 2

## 2020-07-16 MED ORDER — CEFAZOLIN SODIUM-DEXTROSE 2-4 GM/100ML-% IV SOLN
INTRAVENOUS | Status: AC
  Filled 2020-07-16: qty 100

## 2020-07-16 MED ORDER — TRANEXAMIC ACID-NACL 1000-0.7 MG/100ML-% IV SOLN
INTRAVENOUS | Status: AC
  Filled 2020-07-16: qty 100

## 2020-07-16 MED ORDER — PROPOFOL 500 MG/50ML IV EMUL
INTRAVENOUS | Status: DC | PRN
  Administered 2020-07-16: 50 ug/kg/min via INTRAVENOUS

## 2020-07-16 MED ORDER — CEFAZOLIN SODIUM-DEXTROSE 1-4 GM/50ML-% IV SOLN
1.0000 g | Freq: Four times a day (QID) | INTRAVENOUS | Status: AC
  Administered 2020-07-16 (×2): 1 g via INTRAVENOUS
  Filled 2020-07-16 (×2): qty 50

## 2020-07-16 MED ORDER — CHLORHEXIDINE GLUCONATE CLOTH 2 % EX PADS
6.0000 | MEDICATED_PAD | Freq: Once | CUTANEOUS | Status: AC
  Administered 2020-07-16: 6 via TOPICAL

## 2020-07-16 MED ORDER — FENTANYL CITRATE (PF) 100 MCG/2ML IJ SOLN
INTRAMUSCULAR | Status: AC
  Filled 2020-07-16: qty 2

## 2020-07-16 MED ORDER — CHLORHEXIDINE GLUCONATE 0.12 % MT SOLN
OROMUCOSAL | Status: AC
  Administered 2020-07-16: 15 mL via OROMUCOSAL
  Filled 2020-07-16: qty 15

## 2020-07-16 MED ORDER — PHENYLEPHRINE HCL (PRESSORS) 10 MG/ML IV SOLN
INTRAVENOUS | Status: AC
  Filled 2020-07-16: qty 1

## 2020-07-16 MED ORDER — SENNOSIDES-DOCUSATE SODIUM 8.6-50 MG PO TABS: 1.0000 | ORAL_TABLET | Freq: Every evening | ORAL | Status: DC | PRN

## 2020-07-16 MED ORDER — METRONIDAZOLE 0.75 % EX GEL
1.0000 "application " | Freq: Every day | CUTANEOUS | Status: DC
  Administered 2020-07-19 – 2020-07-20 (×2): 1 via TOPICAL
  Filled 2020-07-16 (×2): qty 45

## 2020-07-16 MED ORDER — MAGNESIUM CITRATE PO SOLN
1.0000 | Freq: Once | ORAL | Status: DC | PRN
  Filled 2020-07-16: qty 296

## 2020-07-16 SURGICAL SUPPLY — 69 items
BLADE SAW 90X13X1.19 OSCILLAT (BLADE) ×3 IMPLANT
BLADE SAW 90X25X1.19 OSCILLAT (BLADE) ×3 IMPLANT
CANISTER SUCT 3000ML PPV (MISCELLANEOUS) ×3 IMPLANT
CEMENT HV SMART SET (Cement) ×6 IMPLANT
CEMENT TIBIA MBT SIZE 5 (Knees) ×1 IMPLANT
CNTNR SPEC 2.5X3XGRAD LEK (MISCELLANEOUS) ×1
CONT SPEC 4OZ STER OR WHT (MISCELLANEOUS) ×2
CONTAINER SPEC 2.5X3XGRAD LEK (MISCELLANEOUS) ×1 IMPLANT
COOLER POLAR GLACIER W/PUMP (MISCELLANEOUS) ×3 IMPLANT
COVER WAND RF STERILE (DRAPES) ×3 IMPLANT
CUFF TOURN SGL QUICK 24 (TOURNIQUET CUFF) ×2
CUFF TOURN SGL QUICK 30 (TOURNIQUET CUFF)
CUFF TRNQT CYL 24X4X16.5-23 (TOURNIQUET CUFF) ×1 IMPLANT
CUFF TRNQT CYL 30X4X21-28X (TOURNIQUET CUFF) IMPLANT
DRAPE 3/4 80X56 (DRAPES) ×6 IMPLANT
DRAPE IMP U-DRAPE 54X76 (DRAPES) ×6 IMPLANT
DRAPE INCISE IOBAN 66X60 STRL (DRAPES) ×3 IMPLANT
DRAPE SURG 17X11 SM STRL (DRAPES) ×6 IMPLANT
DRSG OPSITE POSTOP 4X12 (GAUZE/BANDAGES/DRESSINGS) ×3 IMPLANT
DRSG OPSITE POSTOP 4X14 (GAUZE/BANDAGES/DRESSINGS) IMPLANT
DURAPREP 26ML APPLICATOR (WOUND CARE) ×9 IMPLANT
ELECT REM PT RETURN 9FT ADLT (ELECTROSURGICAL) ×3
ELECTRODE REM PT RTRN 9FT ADLT (ELECTROSURGICAL) ×1 IMPLANT
FEMUR SIGMA PS SZ 6.0 R (Femur) ×3 IMPLANT
GAUZE SPONGE 4X4 12PLY STRL (GAUZE/BANDAGES/DRESSINGS) ×3 IMPLANT
GLOVE BIOGEL M STRL SZ7.5 (GLOVE) ×3 IMPLANT
GLOVE BIOGEL PI IND STRL 8 (GLOVE) ×1 IMPLANT
GLOVE BIOGEL PI IND STRL 9 (GLOVE) ×1 IMPLANT
GLOVE BIOGEL PI INDICATOR 8 (GLOVE) ×2
GLOVE BIOGEL PI INDICATOR 9 (GLOVE) ×2
GLOVE SURG 9.0 ORTHO LTXF (GLOVE) ×6 IMPLANT
GOWN STRL REUS TWL 2XL XL LVL4 (GOWN DISPOSABLE) ×3 IMPLANT
GOWN STRL REUS W/ TWL LRG LVL3 (GOWN DISPOSABLE) ×1 IMPLANT
GOWN STRL REUS W/ TWL LRG LVL4 (GOWN DISPOSABLE) ×1 IMPLANT
GOWN STRL REUS W/TWL LRG LVL3 (GOWN DISPOSABLE) ×2
GOWN STRL REUS W/TWL LRG LVL4 (GOWN DISPOSABLE) ×2
HOLDER FOLEY CATH W/STRAP (MISCELLANEOUS) ×3 IMPLANT
IMMBOLIZER KNEE 19 BLUE UNIV (SOFTGOODS) ×3 IMPLANT
IRRIGATION SURGIPHOR STRL (IV SOLUTION) IMPLANT
KIT TURNOVER KIT A (KITS) ×3 IMPLANT
MANIFOLD NEPTUNE II (INSTRUMENTS) ×6 IMPLANT
NDL SAFETY ECLIPSE 18X1.5 (NEEDLE) ×1 IMPLANT
NEEDLE HYPO 18GX1.5 SHARP (NEEDLE) ×2
NEEDLE HYPO 22GX1.5 SAFETY (NEEDLE) ×3 IMPLANT
NEEDLE SPNL 20GX3.5 QUINCKE YW (NEEDLE) ×3 IMPLANT
NS IRRIG 1000ML POUR BTL (IV SOLUTION) ×3 IMPLANT
PACK TOTAL KNEE (MISCELLANEOUS) ×3 IMPLANT
PAD WRAPON POLAR KNEE (MISCELLANEOUS) ×1 IMPLANT
PATELLA DOME PFC 38MM (Knees) ×3 IMPLANT
PENCIL SMOKE EVACUATOR COATED (MISCELLANEOUS) ×3 IMPLANT
PLATE ROT INSERT 10MM SIZE 6 (Plate) ×3 IMPLANT
PULSAVAC PLUS IRRIG FAN TIP (DISPOSABLE) ×3
SOL .9 NS 3000ML IRR  AL (IV SOLUTION) ×2
SOL .9 NS 3000ML IRR UROMATIC (IV SOLUTION) ×1 IMPLANT
SPONGE LAP 18X18 RF (DISPOSABLE) IMPLANT
STAPLER SKIN PROX 35W (STAPLE) ×3 IMPLANT
SUCTION FRAZIER HANDLE 10FR (MISCELLANEOUS) ×2
SUCTION TUBE FRAZIER 10FR DISP (MISCELLANEOUS) ×1 IMPLANT
SUT ETHIBOND NAB CT1 #1 30IN (SUTURE) ×6 IMPLANT
SUT VIC AB 0 CT1 36 (SUTURE) ×3 IMPLANT
SUT VIC AB 2-0 CT1 (SUTURE) ×6 IMPLANT
SYR 20ML LL LF (SYRINGE) ×3 IMPLANT
SYR 30ML LL (SYRINGE) ×6 IMPLANT
TIBIA MBT CEMENT SIZE 5 (Knees) ×3 IMPLANT
TIP FAN IRRIG PULSAVAC PLUS (DISPOSABLE) ×1 IMPLANT
TOWER CARTRIDGE SMART MIX (DISPOSABLE) ×3 IMPLANT
TRAY FOLEY MTR SLVR 16FR STAT (SET/KITS/TRAYS/PACK) ×3 IMPLANT
TUBE SUCT KAM VAC (TUBING) ×3 IMPLANT
WRAPON POLAR PAD KNEE (MISCELLANEOUS) ×3

## 2020-07-16 NOTE — Plan of Care (Signed)
  Problem: Education: Goal: Knowledge of the prescribed therapeutic regimen will improve Outcome: Progressing Goal: Individualized Educational Video(s) Outcome: Progressing   Problem: Activity: Goal: Ability to avoid complications of mobility impairment will improve Outcome: Progressing Goal: Range of joint motion will improve Outcome: Progressing   Problem: Activity: Goal: Ability to avoid complications of mobility impairment will improve Outcome: Progressing Goal: Range of joint motion will improve Outcome: Progressing   Problem: Clinical Measurements: Goal: Postoperative complications will be avoided or minimized Outcome: Progressing   Problem: Pain Management: Goal: Pain level will decrease with appropriate interventions Outcome: Progressing   Problem: Health Behavior/Discharge Planning: Goal: Ability to manage health-related needs will improve Outcome: Progressing   Problem: Clinical Measurements: Goal: Ability to maintain clinical measurements within normal limits will improve Outcome: Progressing Goal: Will remain free from infection Outcome: Progressing Goal: Diagnostic test results will improve Outcome: Progressing Goal: Respiratory complications will improve Outcome: Progressing Goal: Cardiovascular complication will be avoided Outcome: Progressing   Problem: Activity: Goal: Risk for activity intolerance will decrease Outcome: Progressing   Problem: Elimination: Goal: Will not experience complications related to bowel motility Outcome: Progressing Goal: Will not experience complications related to urinary retention Outcome: Progressing   Problem: Elimination: Goal: Will not experience complications related to bowel motility Outcome: Progressing Goal: Will not experience complications related to urinary retention Outcome: Progressing   Problem: Safety: Goal: Ability to remain free from injury will improve Outcome: Progressing   Problem: Skin  Integrity: Goal: Risk for impaired skin integrity will decrease Outcome: Progressing

## 2020-07-16 NOTE — Progress Notes (Signed)
  Subjective:  POST OP CHECK: s/p right total knee arthroplasty.   Patient reports right knee pain as mild to moderate.  Patient is sitting up in bed eating dinner.  He was able to get up out of bed to a chair with physical therapy this afternoon.  Objective:   VITALS:   Vitals:   07/16/20 1432 07/16/20 1542 07/16/20 1544 07/16/20 1642  BP: 132/79 129/79 129/79 115/80  Pulse: 66 70 87 87  Resp: 18 18 18 17   Temp: 98.2 F (36.8 C) 98.3 F (36.8 C)  98.4 F (36.9 C)  TempSrc:  Oral Oral Oral  SpO2: 100% 99% 99% 96%  Weight:      Height:        PHYSICAL EXAM: Right lower extremity Neurovascular intact Sensation intact distally Intact pulses distally Dorsiflexion/Plantar flexion intact Incision: dressing C/D/I No cellulitis present Compartment soft  LABS  Results for orders placed or performed during the hospital encounter of 07/16/20 (from the past 24 hour(s))  ABO/Rh     Status: None   Collection Time: 07/16/20  6:26 AM  Result Value Ref Range   ABO/RH(D)      O POS Performed at [facility], [address] [place],  [address]   Potassium     Status: None   Collection Time: 07/16/20  3:19 PM  Result Value Ref Range   Potassium 4.0 3.5 - 5.1 mmol/L  Magnesium     Status: None   Collection Time: 07/16/20  3:19 PM  Result Value Ref Range   Magnesium 2.0 1.7 - 2.4 mg/dL  Creatinine, serum     Status: None   Collection Time: 07/16/20  3:19 PM  Result Value Ref Range   Creatinine, Ser 0.99 0.61 - 1.24 mg/dL   GFR, Estimated >60 >60 mL/min    DG Knee Right Port  Result Date: 07/16/2020 CLINICAL DATA:  Postop for knee arthroplasty. EXAM: PORTABLE RIGHT KNEE - 1-2 VIEW COMPARISON:  None. FINDINGS: Right knee arthroplasty, without fracture or acute hardware complication. Expected fluid and gas within the joint. Overlying surgical staples. IMPRESSION: Expected appearance after right knee arthroplasty. Electronically Signed   By: Abigail Miyamoto M.D.   On:  07/16/2020 12:02    Assessment/Plan: Day of Surgery   Active Problems:   Total knee replacement status, right  I reviewed the patient's postoperative x-rays which demonstrate his total knee arthroplasty components are well-positioned.  There is no evidence of postop complication.  Patient will complete 24 hours of postop antibiotics.  He will begin his Pradaxa tomorrow which will cover him for DVT prophylaxis.  Patient will also wear foot pumps and TED stockings.  Patient will continue with physical therapy tomorrow.  He is weightbearing as tolerated on the right lower extremity.  Foley catheter will be removed in the morning.  Labs will be checked in the morning.   Thornton Park , MD 07/16/2020, 7:29 PM

## 2020-07-16 NOTE — H&P (Signed)
PREOPERATIVE H&P  Chief Complaint: Right Knee Osteoarthritis  HPI: Terry Collins is a 84 y.o. male who presents for preoperative history and physical with a diagnosis of Right Knee Osteoarthritis. Symptoms of pain, swelling and limited ROM are significantly impairing activities of daily living, including ambulation.  He has failed all non-operative management including corticosteroid and hyaluronic acid injections.   Past Medical History:  Diagnosis Date  . Anemia   . Aortic valve insufficiency   . Arthritis    hands  . Atherosclerosis of aorta ([place])   . Cancer ([place])    skin precancerous areas have been removed  . Carotid stenosis   . History of kidney stones 2010  . Mixed hyperlipidemia   . Paroxysmal atrial fibrillation ([facility])   . Venous insufficiency of both lower extremities    Past Surgical History:  Procedure Laterality Date  . CARDIAC ELECTROPHYSIOLOGY STUDY AND ABLATION  2019   [facility]  . EYE SURGERY Bilateral 2012   cataracts  . HERNIA REPAIR Left 2008   inguinal  . INGUINAL HERNIA REPAIR Right 06/24/2016   Procedure: HERNIA REPAIR INGUINAL ADULT;  Surgeon: Leonie Green, MD;  Location: [facility] ORS;  Service: General;  Laterality: Right;  . XI ROBOTIC ASSISTED INGUINAL HERNIA REPAIR WITH MESH Bilateral 01/20/2020   Procedure: XI ROBOTIC ASSISTED INGUINAL HERNIA REPAIR WITH MESH;  Surgeon: Herbert Pun, MD;  Location: [facility] ORS;  Service: General;  Laterality: Bilateral;   Social History   Socioeconomic History  . Marital status: Widowed    Spouse name: Not on file  . Number of children: Not on file  . Years of education: Not on file  . Highest education level: Not on file  Occupational History  . Not on file  Tobacco Use  . Smoking status: Never Smoker  . Smokeless tobacco: Never Used  Vaping Use  . Vaping Use: Never assessed  Substance and Sexual Activity  . Alcohol use: Yes    Alcohol/week: 1.0 standard drink    Types: 1 Glasses of wine per week     Comment: glass of wine week  . Drug use: No  . Sexual activity: Yes  Other Topics Concern  . Not on file  Social History Narrative  . Not on file   Social Determinants of Health   Financial Resource Strain:   . Difficulty of Paying Living Expenses: Not on file  Food Insecurity:   . Worried About Charity fundraiser in the Last Year: Not on file  . Ran Out of Food in the Last Year: Not on file  Transportation Needs:   . Lack of Transportation (Medical): Not on file  . Lack of Transportation (Non-Medical): Not on file  Physical Activity:   . Days of Exercise per Week: Not on file  . Minutes of Exercise per Session: Not on file  Stress:   . Feeling of Stress : Not on file  Social Connections:   . Frequency of Communication with Friends and Family: Not on file  . Frequency of Social Gatherings with Friends and Family: Not on file  . Attends Religious Services: Not on file  . Active Member of Clubs or Organizations: Not on file  . Attends Archivist Meetings: Not on file  . Marital Status: Not on file   History reviewed. No pertinent family history. No Known Allergies Prior to Admission medications   Medication Sig Start Date End Date Taking? Authorizing Provider  cycloSPORINE (RESTASIS) 0.05 % ophthalmic emulsion Place 1 drop into  both eyes 2 (two) times daily.   Yes [provider]  dofetilide (TIKOSYN) 125 MCG capsule Take 125 mcg by mouth in the morning and at bedtime. 12/14/19  Yes [provider]  ferrous sulfate 325 (65 FE) MG tablet Take 325 mg by mouth daily with breakfast.    Yes [provider]  folic acid (FOLVITE) 330 MCG tablet Take 800 mcg by mouth daily.   Yes [provider]  metroNIDAZOLE (METROGEL) 1 % gel Apply 1 application topically at bedtime.    Yes [provider]  Multiple Vitamin (MULTIVITAMIN WITH MINERALS) TABS tablet Take 1 tablet by mouth daily.   Yes [provider]  NOCDURNA 55.3  MCG SUBL Place 55.3 mcg under the tongue at bedtime.   Yes [provider]  potassium chloride (KLOR-CON SPRINKLE) 10 MEQ CR capsule Take 10 mEq by mouth daily.    Yes [provider]  vitamin B-12 (CYANOCOBALAMIN) 1000 MCG tablet Take 1,000 mcg by mouth daily.   Yes [provider]  dabigatran (PRADAXA) 150 MG CAPS capsule Take 150 mg by mouth 2 (two) times daily.    [provider]     Positive ROS: All other systems have been reviewed and were otherwise negative with the exception of those mentioned in the HPI and as above.  Physical Exam: General: Alert, no acute distress Cardiovascular: Regular rate and rhythm, no murmurs rubs or gallops.  No pedal edema Respiratory: Clear to auscultation bilaterally, no wheezes rales or rhonchi. No cyanosis, no use of accessory musculature GI: No organomegaly, abdomen is soft and non-tender nondistended with positive bowel sounds. Skin: Skin intact, no lesions within the operative field. Neurologic: Sensation intact distally Psychiatric: Patient is competent for consent with normal mood and affect Lymphatic: No cervical lymphadenopathy  MUSCULOSKELETAL: Right knee: Skin intact.  No significant effusion.  ROM 0-115 degrees.  No ligamentous instability. NVI.  5/5 muscle strength.  + Tenderness over the medial joint line and patellofemoral crepitus.  Assessment: Right Knee Osteoarthritis  Plan: Plan for Procedure(s): RIGHT TOTAL KNEE ARTHROPLASTY  I have reviewed the details of the operation and the post-op course with the patient and his daughter at length in the office prior to surgery.  I discussed the risks and benefits of surgery. The risks include but are not limited to infection, bleeding requiring blood transfusion, nerve or blood vessel injury, joint stiffness or loss of motion, persistent pain, weakness or instability, fracture, dislocation, loosening or hardware failure and the need for further surgery.  Medical risks include but are not limited to DVT and pulmonary embolism, myocardial infarction, stroke, pneumonia, respiratory failure and death. Patient understood these risks and wished to proceed.    Thornton Park, MD   07/16/2020 7:53 AM

## 2020-07-16 NOTE — Consult Note (Signed)
Brief Pharmacy Note  Pharmacy consulted to review patient's home medication of  Tikosyn. Confirmed with patient no missed doses and last Tikosyn dose was today at ~0530. Per patient, he typically takes the first dose of Tikosyn around 0800. Confirmed with patient that he takes Tikosyn 125 mcg Q12H.   Will resume home dose of Tikosyn 125 mcg Q12H.   11/18 1519 K 4, Mg 2, Scr 0.99. No electrolyte replacement needed at this time. Will continue to monitor and replace for goal of K > 4 and Mg > 2. QTc < 500 ms.   Thank you for allowing pharmacy to be a part of this patient's care.  Eleonore Chiquito, PharmD, BCPS

## 2020-07-16 NOTE — Consult Note (Addendum)
Brief Pharmacy Note  Pharmacy consulted to review patient's home medication of  Tikosyn. Confirmed with patient no missed doses and last Tikosyn dose was today at ~0530. Per patient, he typically takes the first dose of Tikosyn around 0800. Confirmed with patient that he takes Tikosyn 125 mcg Q12H.   Will resume home dose of Tikosyn 125 mcg Q12H. Will d/w Dr. Nicola Police to about baseline K, Mg, and serum creatinine today (prior to next dose due at  2000). Pharmacy ordered as per consult request to help with medication. Addendum @ 1348: message Dr. Mack Guise about obtaining baseline labs (as mentioned above) and EKG (baseline)   Thank you for allowing pharmacy to be a part of this patient's care.  Kristeen Miss, PharmD Clinical Pharmacist

## 2020-07-16 NOTE — Anesthesia Preprocedure Evaluation (Signed)
Anesthesia Evaluation  Patient identified by MRN, date of birth, ID band Patient awake    Reviewed: Allergy & Precautions, H&P , NPO status , Patient's Chart, lab work & pertinent test results  History of Anesthesia Complications Negative for: history of anesthetic complications  Airway Mallampati: III  TM Distance: <3 FB Neck ROM: full    Dental  (+) Teeth Intact, Dental Advidsory Given   Pulmonary neg pulmonary ROS, neg COPD,    breath sounds clear to auscultation       Cardiovascular Exercise Tolerance: Good (-) angina(-) Past MI + dysrhythmias Atrial Fibrillation  Rhythm:irregular Rate:Normal     Neuro/Psych negative neurological ROS  negative psych ROS   GI/Hepatic negative GI ROS, Neg liver ROS,   Endo/Other  negative endocrine ROS  Renal/GU      Musculoskeletal  (+) Arthritis ,   Abdominal   Peds  Hematology negative hematology ROS (+)   Anesthesia Other Findings Past Medical History: No date: Anemia No date: Arthritis     Comment:  hands No date: Cancer ([place])     Comment:  skin precancerous areas have been removed No date: Dysrhythmia     Comment:  treated for atrial fib 2010: History of kidney stones  Past Surgical History: 2012: EYE SURGERY; Bilateral     Comment:  cataracts 2008: HERNIA REPAIR; Left     Comment:  inguinal 06/24/2016: INGUINAL HERNIA REPAIR; Right     Comment:  Procedure: HERNIA REPAIR INGUINAL ADULT;  Surgeon:               Leonie Green, MD;  Location: [facility] ORS;  Service:               General;  Laterality: Right;     Reproductive/Obstetrics negative OB ROS                             Anesthesia Physical  Anesthesia Plan  ASA: II  Anesthesia Plan: Spinal   Post-op Pain Management:    Induction:   PONV Risk Score and Plan: Treatment may vary due to age or medical condition, Propofol infusion and TIVA  Airway Management Planned:  Simple Face Mask and Natural Airway  Additional Equipment:   Intra-op Plan:   Post-operative Plan:   Informed Consent: I have reviewed the patients History and Physical, chart, labs and discussed the procedure including the risks, benefits and alternatives for the proposed anesthesia with the patient or authorized representative who has indicated his/her understanding and acceptance.     Dental Advisory Given  Plan Discussed with: Anesthesiologist, CRNA and Surgeon  Anesthesia Plan Comments:         Anesthesia Quick Evaluation

## 2020-07-16 NOTE — Op Note (Addendum)
DATE OF SURGERY:  07/16/2020 TIME: 11:32 AM  PATIENT NAME:  Terry Collins   AGE: 84 y.o.    PRE-OPERATIVE DIAGNOSIS:  Right Knee Osteoarthritis  POST-OPERATIVE DIAGNOSIS:  Same  PROCEDURE:  Procedure(s): RIGHT TOTAL KNEE ARTHROPLASTY  SURGEON:  Thornton Park, MD   ASSISTANT:  Roland Rack, PA  OPERATIVE IMPLANTS: Depuy PFC Sigma, Posterior Stabilized Femural component size 6, Tibia size rotating platform component size 5, Patella polyethylene 3-peg oval button size 38, with a 10 mm polyethylene insert.  EBL:  25  TOURNIQUET TIME:  129 minutes  PREOPERATIVE INDICATIONS:  Terry Collins is an 84 y.o. male who has a diagnosis of  Right Knee Osteoarthritis and elected for a right total knee arthroplasty after failing nonoperative treatment.  Their knee pain significantly impacts their activity of daily living.  Radiographs have demonstrated tricompartmental osteoarthritis joint space narrowing, osteophytes and subchondral sclerosis.  The risks, benefits, and alternatives were discussed at length including but not limited to the risks of infection, bleeding, nerve or blood vessel injury, knee stiffness, fracture, dislocation, loosening or failure of the hardware and the need for further surgery. Medical risks include but not limited to DVT and pulmonary embolism, myocardial infarction, stroke, pneumonia, respiratory failure and death. I discussed these risks with the patient in my office prior to the date of surgery. They understood these risks and were willing to proceed.  OPERATIVE FINDINGS AND UNIQUE ASPECTS OF THE CASE: Tricompartmental osteoarthritis with valgus angulation.  Chalky white substance seen throughout the knee potential synovium likely secondary to chondrocalcinosis versus residue from previous corticosteroid injections.  OPERATIVE DESCRIPTION:  The patient was brought to the operative room and placed in a supine position after undergoing placement of a spinal anesthetic.   A Foley catheter was placed.  IV antibiotics were given. Patient received 2 g of Ancef IV and clindamycin 600 mg IV.  Patient also received tranexamic acid IV prior to inflation of the tourniquet.  The lower extremity was prepped and draped in the usual sterile fashion.  A time out was performed to verify the patient's name, date of birth, medical record number, correct site of surgery and correct procedure to be performed. The timeout was also used to confirm the patient received antibiotics and that appropriate instruments, implants and radiographs studies were available in the room.  The leg was elevated and exsanguinated with an Esmarch and the tourniquet was inflated to 275 mmHg for 29 minutes..  A midline incision was made over the right knee. Full-thickness skin flaps were developed. A medial parapatellar arthrotomy was then made and the patella everted and the knee was brought into 90 of flexion. Hoffa's fat pad along with the cruciate ligaments and medial and lateral menisci were resected.   The distal femoral intramedullary canal was opened with a drill and the intramedullary distal femoral cutting jig was inserted into the femoral canal pinned into position. It was set at 5 degrees resecting 10 mm off the distal femur.  Care was taken to protect the collateral ligaments during distal femoral resection.  The distal femoral resection was performed with an oscillating saw. The femoral cutting guide was then removed.  The extramedullary tibial cutting guide was then placed using the anterior tibial crest and second ray of the foot as a references.  The tibial cutting guide was adjusted to allow for appropriate posterior slope.  The tibial cutting block was pinned into position. The slotted stylus was used to measure the proximal tibial resection of 10 mm off  the high lateral side.  The tibial long rod alignment guide was then used to confirm position of the cutting block. A third cross pin through the  tibial cutting block was then drilled into position to allow for rotational stability. Care was taken during the tibial resection to protect the medial and collateral ligaments.  The resected tibial bone was removed along with the posterior horns of the menisci.  The PCL was sacrificed.  Extension gap was measured with a spacer block and alignment and extension was confirmed using a long alignment rod.  The attention was then turned back to the femur. The posterior referencing distal femoral sizing guide was applied to the distal femur.  The femur was sized to be a size 6. Rotation of the referencing guide was checked with the epicondylar axis and Whitesides line. Then the 4-in-1 cutting jig was then applied to the distal femur. A stylus was used to confirm that the anterior femur would not be notched.   Then the anterior, posterior and chamfer femoral cuts were then made with an oscillating saw.  The flexion gap was then measured with a flexion spacer block and long alignment rod and was found to be symmetric with the extension gap and perpendicular to mechanical axis of the tibia.  The distal femoral preparation was completed by performing the posterior stabilized box cut using the cutting block. The entry site for the intramedullary femoral guide was filled with autologous bone graft from bone previously resected earlier in the case.  The proximal tibia plateau was then sized with trial trays. The best coverage was achieved with a size 5. This tibial tray was then pinned into position. The proximal tibia was then prepared with the reamer and keel punch.  After tibial preparation was completed, all trial components were inserted with polyethylene trials.  The knee was found to have excellent balance and full motion with a size 10 mm tibial polyethylene insert..    The attention was then turned to preparation of the patella. The thickness of the patella was measured with a caliper, the diameter measured  with the patella templates.  The patella resection was then made with an oscillating saw using the patella cutting guide.  3 peg holes for the patella component were then drilled. The trial patella was then placed. Knee was taken through a full range of motion and deemed to be stable with the trial components. All trial components were then removed. The knee capsule was then injected with Exparel.  The knee joint capsule was injected with a mixture of quarter percent Marcaine and morphine to assist with postoperative pain relief.  The joint was copiously irrigated with pulse lavage.  The final total knee arthroplasty components were then cemented into place with a 10 mm trial polyethylene insert and all excess methylmethacrylate was removed.  The joint was again copiously irrigated. After the cement had hardened the knee was again taken through a full range of motion. It was felt to be most stable with the 10 mm tibial polyethylene insert. The actual tibial polyethylene insert was then placed.   The knee was taken through a range of motion and the patella tracked well and the knee was again irrigated copiously.    The medial arthrotomy was closed with #1 Ethibond. The subcutaneous tissue closed with 0 and 2-0 vicryl, and skin approximated with staples.  A dry sterile and compressive dressing was applied.  A Polar Care was applied to the operative knee along with a  knee immobilizer.  The patient was awakened and brought to the PACU in stable and satisfactory condition.  All sharp, lap and instrument counts were correct at the conclusion the case. I left the patient's daughter a voicemail on her cellphone from PACU to let her know the case had been performed without complication and the patient was stable in recovery room.

## 2020-07-16 NOTE — Transfer of Care (Signed)
Immediate Anesthesia Transfer of Care Note  Patient: Navdeep Halt  Procedure(s) Performed: RIGHT TOTAL KNEE ARTHROPLASTY (Right Knee)  Patient Location: PACU  Anesthesia Type:General  Level of Consciousness: awake, alert  and oriented  Airway & Oxygen Therapy: Patient Spontanous Breathing and Patient connected to face mask oxygen  Post-op Assessment: Report given to RN and Post -op Vital signs reviewed and stable  Post vital signs: Reviewed and stable  Last Vitals:  Vitals Value Taken Time  BP 138/65 07/16/20 1116  Temp 36.7 C 07/16/20 1116  Pulse 94 07/16/20 1120  Resp 16 07/16/20 1120  SpO2 99 % 07/16/20 1120  Vitals shown include unvalidated device data.  Last Pain:  Vitals:   07/16/20 0615  TempSrc: Oral  PainSc: 0-No pain         Complications: No complications documented.

## 2020-07-16 NOTE — Anesthesia Procedure Notes (Signed)
Procedure Name: MAC Date/Time: 07/16/2020 8:09 AM Performed by: Lily Peer, Malayja Freund, CRNA Pre-anesthesia Checklist: Patient identified, Emergency Drugs available, Suction available, Patient being monitored and Timeout performed Patient Re-evaluated:Patient Re-evaluated prior to induction Oxygen Delivery Method: Simple face mask

## 2020-07-16 NOTE — Anesthesia Procedure Notes (Signed)
Spinal  Patient location during procedure: OR Start time: 07/16/2020 8:06 AM End time: 07/16/2020 8:08 AM Staffing Performed: resident/CRNA  Anesthesiologist: Martha Clan, MD Resident/CRNA: Norm Salt, CRNA Preanesthetic Checklist Completed: patient identified, IV checked, site marked, risks and benefits discussed, surgical consent, monitors and equipment checked and pre-op evaluation Spinal Block Patient position: sitting Prep: ChloraPrep Patient monitoring: heart rate, continuous pulse ox and blood pressure Approach: midline Location: L3-4 Injection technique: single-shot Needle Needle type: Pencan  Needle gauge: 25 G Needle length: 10 cm

## 2020-07-16 NOTE — Evaluation (Signed)
Physical Therapy Evaluation Patient Details Name: Terry Collins MRN: [id number] DOB: 1935-12-02 Today's Date: 07/16/2020   History of Present Illness  Terry Collins is an 67yoM who comes to [facility] on 11/18 for elective TKA of Right laterality. PTA pt lived alone at the [facility] at [place], used a 4WW to circumnavigate the living environment.  Clinical Impression  Pt admitted with above diagnosis. Pt currently with functional limitations due to the deficits listed below (see "PT Problem List"). Upon entry, pt in bed, awake and agreeable to participate. DTR at bedside. The pt is alert and oriented x4 pleasant, conversational, and generally a good historian. Pt educated on precautions, use of polarcare, use of KI, HEP education, transfers technique. MaxA to EOB, minA to rise to standing from elevated EOB with RW. In standing pt able to take small steps to pivot to recliner without need for assist for balance. Functional mobility assessment demonstrates increased effort/time requirements, poor tolerance, and need for physical assistance, whereas the patient performed these at a higher level of independence PTA. Pt will benefit from skilled PT intervention to increase independence and safety with basic mobility in preparation for discharge to the venue listed below.      Follow Up Recommendations SNF;Supervision for mobility/OOB    Equipment Recommendations  Rolling walker with 5" wheels    Recommendations for Other Services       Precautions / Restrictions Precautions Precautions: Fall;Knee Required Braces or Orthoses: Knee Immobilizer - Right Knee Immobilizer - Right: On at all times;Other (comment) (can be removed for mobility or ROM; use KI to promote TKE ROM) Restrictions Weight Bearing Restrictions: Yes RLE Weight Bearing: Weight bearing as tolerated      Mobility  Bed Mobility Overal bed mobility: Needs Assistance Bed Mobility: Supine to Sit     Supine to sit: Max  assist;HOB elevated          Transfers Overall transfer level: Needs assistance Equipment used: Rolling walker (2 wheeled) Transfers: Sit to/from [facility] Sit to Stand: From elevated surface;Min assist Stand pivot transfers: Min guard       General transfer comment: cues for safe technique  Ambulation/Gait                Stairs            Wheelchair Mobility    Modified Rankin (Stroke Patients Only)       Balance Overall balance assessment: Modified Independent;No apparent balance deficits (not formally assessed)                                           Pertinent Vitals/Pain Pain Assessment: 0-10 Pain Score: 5  Pain Location: Rt knee during exercise Pain Intervention(s): Limited activity within patient's tolerance;Monitored during session;Premedicated before session;Repositioned    Home Living Family/patient expects to be discharged to:: Private residence Living Arrangements: Alone Available Help at Discharge: [place] Type of Home: [place]: One level Home Equipment: Environmental consultant - 4 wheels;Shower seat      Prior Function                 Hand Dominance   Dominant Hand: Right    Extremity/Trunk Assessment   Upper Extremity Assessment Upper Extremity Assessment: Generalized weakness    Lower Extremity Assessment Lower Extremity Assessment: Generalized weakness    Cervical / Trunk  Assessment Cervical / Trunk Assessment: Kyphotic  Communication      Cognition Arousal/Alertness: Awake/alert Behavior During Therapy: WFL for tasks assessed/performed Overall Cognitive Status: Within Functional Limits for tasks assessed                                        General Comments      Exercises Total Joint Exercises Ankle Circles/Pumps: AROM;Both;15 reps;Supine Quad Sets: AROM;Both;10 reps;Supine Short Arc Quad: AROM;AAROM;Both;10  reps;Supine Heel Slides: AAROM;AROM;Both;15 reps;Supine Hip ABduction/ADduction: AROM;AAROM;Both;15 reps;Supine Straight Leg Raises: AAROM;Both;10 reps;Supine Goniometric ROM: Rt knee flexion ROM: 28-78 degrees   Assessment/Plan    PT Assessment Patient needs continued PT services  PT Problem List Decreased strength;Decreased range of motion;Decreased activity tolerance;Decreased balance;Decreased mobility;Decreased knowledge of use of DME;Decreased knowledge of precautions       PT Treatment Interventions DME instruction;Gait training;Stair training;Functional mobility training;Therapeutic activities;Therapeutic exercise;Patient/family education    PT Goals (Current goals can be found in the Care Plan section)  Acute Rehab PT Goals Patient Stated Goal: regain independent mobility PT Goal Formulation: With patient Time For Goal Achievement: 07/30/20 Potential to Achieve Goals: Fair    Frequency BID   Barriers to discharge Decreased caregiver support      Co-evaluation               AM-PAC PT "6 Clicks" Mobility  Outcome Measure Help needed turning from your back to your side while in a flat bed without using bedrails?: A Lot Help needed moving from lying on your back to sitting on the side of a flat bed without using bedrails?: A Lot Help needed moving to and from a bed to a chair (including a wheelchair)?: A Lot Help needed standing up from a chair using your arms (e.g., wheelchair or bedside chair)?: A Lot Help needed to walk in hospital room?: A Little Help needed climbing 3-5 steps with a railing? : A Little 6 Click Score: 14    End of Session Equipment Utilized During Treatment: Gait belt;Right knee immobilizer Activity Tolerance: Patient tolerated treatment well;No increased pain Patient left: in chair;with call bell/phone within reach;Other (comment) (call bell system not working. I tried 4 different callbells. NA reports this to be a recurrent problem in  151) Nurse Communication: Mobility status;Precautions PT Visit Diagnosis: Unsteadiness on feet (R26.81);Difficulty in walking, not elsewhere classified (R26.2);Muscle weakness (generalized) (M62.81)    Time: 7425-9563 PT Time Calculation (min) (ACUTE ONLY): 64 min   Charges:   PT Evaluation $PT Eval Low Complexity: 1 Low PT Treatments $Gait Training: 8-22 mins $Therapeutic Exercise: 23-37 mins        5:17 PM, 07/16/20 [name], PT, DPT Physical Therapist - [facility]  [phone] ([place])   [place] C 07/16/2020, 5:15 PM

## 2020-07-17 ENCOUNTER — Encounter: Payer: Self-pay | Admitting: Orthopedic Surgery

## 2020-07-17 LAB — CREATININE, SERUM
Creatinine, Ser: 0.95 mg/dL (ref 0.61–1.24)
GFR, Estimated: >60 mL/min (ref >60)

## 2020-07-17 LAB — MAGNESIUM: Magnesium: 2.1 mg/dL (ref 1.7–2.4)

## 2020-07-17 LAB — POTASSIUM: Potassium: 3.9 mmol/L (ref 3.5–5.1)

## 2020-07-17 NOTE — Consult Note (Signed)
[facility] Consultation Note  Patient ID: Terry Collins, MRN: [id number], DOB/AGE: May 14, 1936 84 y.o. Admit date: 07/16/2020   Date of Consult: 07/17/2020 Primary Physician: Tracie Harrier, MD Primary Cardiologist: Nehemiah Massed  Chief Complaint: No chief complaint on file.  Reason for Consult: Paroxysmal nonvalvular atrial fibrillation and medication management  HPI: 84 y.o. male with known paroxysmal nonvalvular atrial fibrillation for which the patient has been treated with the appropriate medication management.  This includes the patient has been on anticoagulation for quite some time without any apparent to significant bleeding complications.  In addition to that the patient has been maintaining normal sinus rhythm for years with Tikosyn use.  He has not had any significant rhythm disturbances in the last many months.  He has had a recent stress test and echocardiogram showing no evidence of significant myocardial ischemia but moderate aortic insufficiency which has been stable.  The patient also has had some peripheral vascular disease hyperlipidemia and hypertension for which have been stable at this time and not requiring further intervention and or medication management.  The patient has successfully had a right knee procedure for which she tolerated well and has had no evidence of postoperative issues.  There is been no evidence of chest discomfort congestive heart failure shortness of breath or other cardiovascular concerns. EKG has been performed showing normal sinus rhythm with left atrial enlargement with left anterior fascicular block and a QTC of 484 Blood work has been performed for evaluation and assessment of follow-up for use of Tikosyn medication management with a creatinine of 0.9, a magnesium of 2, and that potassium level of 4.  These are all appropriate levels for the continuation of medication management of Tikosyn  Past Medical History:  Diagnosis Date  .  Anemia   . Aortic valve insufficiency   . Arthritis    hands  . Atherosclerosis of aorta ([place])   . Cancer ([place])    skin precancerous areas have been removed  . Carotid stenosis   . History of kidney stones 2010  . Mixed hyperlipidemia   . Paroxysmal atrial fibrillation ([facility])   . Venous insufficiency of both lower extremities       Surgical History:  Past Surgical History:  Procedure Laterality Date  . CARDIAC ELECTROPHYSIOLOGY STUDY AND ABLATION  2019   [facility]  . EYE SURGERY Bilateral 2012   cataracts  . HERNIA REPAIR Left 2008   inguinal  . INGUINAL HERNIA REPAIR Right 06/24/2016   Procedure: HERNIA REPAIR INGUINAL ADULT;  Surgeon: Leonie Green, MD;  Location: [facility] ORS;  Service: General;  Laterality: Right;  . XI ROBOTIC ASSISTED INGUINAL HERNIA REPAIR WITH MESH Bilateral 01/20/2020   Procedure: XI ROBOTIC ASSISTED INGUINAL HERNIA REPAIR WITH MESH;  Surgeon: Herbert Pun, MD;  Location: [facility] ORS;  Service: General;  Laterality: Bilateral;     Home Meds: Prior to Admission medications   Medication Sig Start Date End Date Taking? Authorizing Provider  cycloSPORINE (RESTASIS) 0.05 % ophthalmic emulsion Place 1 drop into both eyes 2 (two) times daily.   Yes [provider]  dofetilide (TIKOSYN) 125 MCG capsule Take 125 mcg by mouth in the morning and at bedtime. 12/14/19  Yes [provider]  ferrous sulfate 325 (65 FE) MG tablet Take 325 mg by mouth daily with breakfast.    Yes [provider]  folic acid (FOLVITE) 694 MCG tablet Take 800 mcg by mouth daily.   Yes [provider]  metroNIDAZOLE (METROGEL) 1 % gel Apply  1 application topically at bedtime.    Yes [provider]  Multiple Vitamin (MULTIVITAMIN WITH MINERALS) TABS tablet Take 1 tablet by mouth daily.   Yes [provider]  NOCDURNA 55.3 MCG SUBL Place 55.3 mcg under the tongue at bedtime.   Yes [provider]  potassium chloride (KLOR-CON  SPRINKLE) 10 MEQ CR capsule Take 10 mEq by mouth daily.    Yes [provider]  vitamin B-12 (CYANOCOBALAMIN) 1000 MCG tablet Take 1,000 mcg by mouth daily.   Yes [provider]  dabigatran (PRADAXA) 150 MG CAPS capsule Take 150 mg by mouth 2 (two) times daily.    [provider]    Inpatient Medications:  . cycloSPORINE  1 drop Both Eyes BID  . dabigatran  150 mg Oral BID  . Desmopressin Acetate  55.3 mcg Sublingual QHS  . docusate sodium  100 mg Oral BID  . dofetilide  125 mcg Oral Q12H  . ferrous sulfate  325 mg Oral Q breakfast  . folic acid  408 mcg Oral Daily  . metroNIDAZOLE  1 application Topical QHS  . multivitamin with minerals  1 tablet Oral Daily  . potassium chloride  10 mEq Oral Daily  . traMADol  50 mg Oral Q6H  . vitamin B-12  1,000 mcg Oral Daily   . sodium chloride 75 mL/hr at 07/16/20 1749  . methocarbamol (ROBAXIN) IV      Allergies: No Known Allergies  Social History   Socioeconomic History  . Marital status: Widowed    Spouse name: Not on file  . Number of children: Not on file  . Years of education: Not on file  . Highest education level: Not on file  Occupational History  . Not on file  Tobacco Use  . Smoking status: Never Smoker  . Smokeless tobacco: Never Used  Vaping Use  . Vaping Use: Never assessed  Substance and Sexual Activity  . Alcohol use: Yes    Alcohol/week: 1.0 standard drink    Types: 1 Glasses of wine per week    Comment: glass of wine week  . Drug use: No  . Sexual activity: Yes  Other Topics Concern  . Not on file  Social History Narrative  . Not on file   Social Determinants of Health   Financial Resource Strain:   . Difficulty of Paying Living Expenses: Not on file  Food Insecurity:   . Worried About Charity fundraiser in the Last Year: Not on file  . Ran Out of Food in the Last Year: Not on file  Transportation Needs:   . Lack of Transportation (Medical): Not on file  . Lack of  Transportation (Non-Medical): Not on file  Physical Activity:   . Days of Exercise per Week: Not on file  . Minutes of Exercise per Session: Not on file  Stress:   . Feeling of Stress : Not on file  Social Connections:   . Frequency of Communication with Friends and Family: Not on file  . Frequency of Social Gatherings with Friends and Family: Not on file  . Attends Religious Services: Not on file  . Active Member of Clubs or Organizations: Not on file  . Attends Archivist Meetings: Not on file  . Marital Status: Not on file  Intimate Partner Violence:   . Fear of Current or Ex-Partner: Not on file  . Emotionally Abused: Not on file  . Physically Abused: Not on file  . Sexually Abused:  Not on file     History reviewed. No pertinent family history.   Review of Systems Positive for right knee pain Negative for: General:  chills, fever, night sweats or weight changes.  Cardiovascular: PND orthopnea syncope dizziness  Dermatological skin lesions rashes Respiratory: Cough congestion Urologic: Frequent urination urination at night and hematuria Abdominal: negative for nausea, vomiting, diarrhea, bright red blood per rectum, melena, or hematemesis Neurologic: negative for visual changes, and/or hearing changes  All other systems reviewed and are otherwise negative except as noted above.  Labs: No results for input(s): CKTOTAL, CKMB, TROPONINI in the last 72 hours. No results found for: WBC, HGB, HCT, MCV, PLT  Recent Labs  Lab 07/16/20 1519  K 4.0  CREATININE 0.99   No results found for: CHOL, HDL, LDLCALC, TRIG No results found for: DDIMER  Radiology/Studies:  Chest 2 View  Result Date: 07/08/2020 CLINICAL DATA:  Preop evaluation for total knee replacement EXAM: CHEST - 2 VIEW COMPARISON:  None available FINDINGS: Mild hyperinflation noted with bilateral apical pleuroparenchymal scarring. Background COPD suspected. No superimposed acute airspace process,  collapse or consolidation. Negative for edema, effusion or pneumothorax. Trachea midline. Mild cardiomegaly. Aorta is atherosclerotic and tortuous. No acute osseous finding. 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 Cardiomegaly Aortic Atherosclerosis (ICD10-I70.0). Electronically Signed   By: Jerilynn Mages.  Shick M.D.   On: 07/08/2020 10:58   DG Knee Right Port  Result Date: 07/16/2020 CLINICAL DATA:  Postop for knee arthroplasty. EXAM: PORTABLE RIGHT KNEE - 1-2 VIEW COMPARISON:  None. FINDINGS: Right knee arthroplasty, without fracture or acute hardware complication. Expected fluid and gas within the joint. Overlying surgical staples. IMPRESSION: Expected appearance after right knee arthroplasty. Electronically Signed   By: Abigail Miyamoto M.D.   On: 07/16/2020 12:02    EKG: Normal sinus rhythm with left atrial enlargement and left anterior fascicular block with a QTC of 484  Weights: Filed Weights   07/16/20 0615  Weight: 73.5 kg     Physical Exam: Blood pressure 120/71, pulse 62, temperature 97.8 F (36.6 C), temperature source Oral, resp. rate 16, height 6\' 3"  (1.905 m), weight 73.5 kg, SpO2 99 %. Body mass index is 20.25 kg/m. General: Well developed, well nourished, in no acute distress. Head eyes ears nose throat: Normocephalic, atraumatic, sclera non-icteric, no xanthomas, nares are without discharge. No apparent thyromegaly and/or mass  Lungs: Normal respiratory effort.  no wheezes, no rales, no rhonchi.  Heart: RRR with normal S1 S2.  Slight decrescendo murmur gallop, no rub, PMI is normal size and placement, carotid upstroke normal without bruit, jugular venous pressure is normal Abdomen: Soft, non-tender, non-distended with normoactive bowel sounds. No hepatomegaly. No rebound/guarding. No obvious abdominal masses. Abdominal aorta is normal size without bruit Extremities: No edema. no cyanosis, no clubbing, no ulcers  Peripheral : Right knee is  wrapped  Neuro: Alert and oriented. No facial asymmetry. No focal deficit. Moves all extremities spontaneously. Musculoskeletal: Normal muscle tone without kyphosis Psych:  Responds to questions appropriately with a normal affect.    Assessment: 84 year old male with peripheral vascular disease borderline hypertension hyperlipidemia with paroxysmal nonvalvular atrial fibrillation remaining in normal sinus rhythm status post orthopedic surgery without evidence of complication or cardiovascular symptoms at this time with appropriate parameters for the continued use of Tikosyn for maintenance of normal sinus rhythm as listed above  Plan: 1.  Continuation of Tikosyn and 0.125 mcg twice per day for maintenance of normal sinus rhythm as patient has tolerated before 2.  Reinstatement  of Pradaxa at 150 mg twice per day as patient tolerated before for further risk reduction in stroke with paroxysmal nonvalvular atrial fibrillation 3.  No further cardiac diagnostics and/or treatment changes necessary at this time 4.  If ambulating well from the orthopedic standpoint with no further symptoms would be okay for discharge home from the cardiac standpoint with follow-up at regularly scheduled appointment  Signed, Corey Skains M.D. [facility] 07/17/2020, 8:32 AM

## 2020-07-17 NOTE — Anesthesia Postprocedure Evaluation (Signed)
Anesthesia Post Note  Patient: Terry Collins  Procedure(s) Performed: RIGHT TOTAL KNEE ARTHROPLASTY (Right Knee)  Patient location during evaluation: Nursing Unit Anesthesia Type: Spinal Level of consciousness: oriented and awake and alert Pain management: pain level controlled Vital Signs Assessment: post-procedure vital signs reviewed and stable Respiratory status: spontaneous breathing, respiratory function stable and patient connected to nasal cannula oxygen Cardiovascular status: blood pressure returned to baseline and stable Postop Assessment: no headache, no backache and no apparent nausea or vomiting Anesthetic complications: no   No complications documented.   Last Vitals:  Vitals:   07/17/20 0442 07/17/20 0722  BP: 122/68 120/71  Pulse: 66 62  Resp: 15 16  Temp: 36.6 C 36.6 C  SpO2: 98% 99%    Last Pain:  Vitals:   07/17/20 0722  TempSrc: Oral  PainSc:                  Estill Batten

## 2020-07-17 NOTE — NC FL2 (Signed)
[place] LEVEL OF CARE SCREENING TOOL     IDENTIFICATION  Patient Name: Terry Collins Birthdate: [birth year] Sex: male Admission Date (Current Location): 07/16/2020  [employer] [facility] and [place] Number:  Engineering geologist and Address:  [facility], [address] [place], [place] [address]      Provider Number: [id number]  Attending Physician Name and Address:  Thornton Park, MD  Relative Name and Phone Number:  Wayman Hoard [phone]    Current Level of Care: Hospital Recommended Level of Care: Accident Prior Approval Number:    Date Approved/Denied:   PASRR Number: [id number]  Discharge Plan: SNF    Current Diagnoses: Patient Active Problem List   Diagnosis Date Noted  . Total knee replacement status, right 07/16/2020    Orientation RESPIRATION BLADDER Height & Weight     Self, Situation, Time, Place  Normal Continent Weight: 73.5 kg Height:  6\' 3"  (190.5 cm)  BEHAVIORAL SYMPTOMS/MOOD NEUROLOGICAL BOWEL NUTRITION STATUS      Continent Diet (Regular)  AMBULATORY STATUS COMMUNICATION OF NEEDS Skin   Limited Assist   Surgical wounds                       Personal Care Assistance Level of Assistance  Bathing, Feeding, Dressing Bathing Assistance: Limited assistance Feeding assistance: Independent Dressing Assistance: Limited assistance     Functional Limitations Info             SPECIAL CARE FACTORS FREQUENCY  PT (By licensed PT), OT (By licensed OT)                    Contractures Contractures Info: Not present    Additional Factors Info  Code Status, Allergies Code Status Info: Full Allergies Info: No known allergies           Current Medications (07/17/2020):  This is the current hospital active medication list Current Facility-Administered Medications  Medication Dose Route Frequency Provider Last Rate Last Admin  . 0.9 %  sodium chloride infusion   Intravenous  Continuous Thornton Park, MD 75 mL/hr at 07/16/20 1749 New Bag at 07/16/20 1749  . acetaminophen (TYLENOL) tablet 325-650 mg  325-650 mg Oral Q6H PRN Thornton Park, MD      . bisacodyl (DULCOLAX) EC tablet 10 mg  10 mg Oral Daily PRN Thornton Park, MD      . cycloSPORINE (RESTASIS) 0.05 % ophthalmic emulsion 1 drop  1 drop Both Eyes BID Thornton Park, MD   1 drop at 07/17/20 0811  . dabigatran (PRADAXA) capsule 150 mg  150 mg Oral BID Thornton Park, MD   150 mg at 07/17/20 0811  . Desmopressin Acetate SUBL 55.3 mcg  55.3 mcg Sublingual QHS Thornton Park, MD      . docusate sodium (COLACE) capsule 100 mg  100 mg Oral BID Thornton Park, MD   100 mg at 07/17/20 0810  . dofetilide (TIKOSYN) capsule 125 mcg  125 mcg Oral Q12H Thornton Park, MD   125 mcg at 07/17/20 0810  . ferrous sulfate tablet 325 mg  325 mg Oral Q breakfast Thornton Park, MD   325 mg at 07/17/20 0811  . folic acid (FOLVITE) tablet 800 mcg  800 mcg Oral Daily Thornton Park, MD   800 mcg at 07/17/20 0813  . HYDROcodone-acetaminophen (NORCO/VICODIN) 5-325 MG per tablet 1-2 tablet  1-2 tablet Oral Q4H PRN Thornton Park, MD   2 tablet at 07/16/20 1604  . magnesium  citrate solution 1 Bottle  1 Bottle Oral Once PRN Thornton Park, MD      . methocarbamol (ROBAXIN) tablet 500 mg  500 mg Oral Q6H PRN Thornton Park, MD       Or  . methocarbamol (ROBAXIN) 500 mg in dextrose 5 % 50 mL IVPB  500 mg Intravenous Q6H PRN Thornton Park, MD      . metroNIDAZOLE (METROGEL) 5.05 % gel 1 application  1 application Topical QHS Thornton Park, MD      . morphine 2 MG/ML injection 1 mg  1 mg Intravenous Q2H PRN Thornton Park, MD      . multivitamin with minerals tablet 1 tablet  1 tablet Oral Daily Thornton Park, MD   1 tablet at 07/17/20 0810  . ondansetron (ZOFRAN) tablet 4 mg  4 mg Oral Q6H PRN Thornton Park, MD       Or  . ondansetron [facility]) injection 4 mg  4 mg Intravenous Q6H PRN Thornton Park, MD      . potassium chloride (KLOR-CON) CR tablet 10 mEq  10 mEq Oral Daily Thornton Park, MD   10 mEq at 07/17/20 3976  . senna-docusate (Senokot-S) tablet 1 tablet  1 tablet Oral QHS PRN Thornton Park, MD      . traMADol Veatrice Bourbon) tablet 50 mg  50 mg Oral Q6H Thornton Park, MD   50 mg at 07/17/20 0553  . vitamin B-12 (CYANOCOBALAMIN) tablet 1,000 mcg  1,000 mcg Oral Daily Thornton Park, MD   1,000 mcg at 07/17/20 7341     Discharge Medications: Please see discharge summary for a list of discharge medications.  Relevant Imaging Results:  Relevant Lab Results:   Additional Information SS# [id number]  Shelbie Ammons, RN

## 2020-07-17 NOTE — Progress Notes (Signed)
  Subjective:  POD #1 s/p right total knee arthroplasty.   Patient reports right knee pain as mild to moderate.  Patient's daughter is at the bedside.  Patient states he was able to participate well with physical therapy today.  Patient was able to get up out of bed today with therapy.  Patient is on Pradaxa at baseline for DVT prophylaxis.  Dr. Nehemiah Massed saw the patient today in consultation to provide guidance on using patient's antiarrhythmic, Tikosyn.  Objective:   VITALS:   Vitals:   07/17/20 0722 07/17/20 1140 07/17/20 1529 07/17/20 1530  BP: 120/71 134/84 131/77   Pulse: 62 74 74 71  Resp: 16 16 16    Temp: 97.8 F (36.6 C) 97.7 F (36.5 C) 97.7 F (36.5 C)   TempSrc: Oral     SpO2: 99% 98% (!) 89% 95%  Weight:      Height:        PHYSICAL EXAM: Right lower extremity: I personally change the patient's dressings today.  His honeycomb dressings clean dry and intact. Neurovascular intact Sensation intact distally Intact pulses distally Dorsiflexion/Plantar flexion intact Incision: dressing C/D/I No cellulitis present Compartment soft  LABS  Results for orders placed or performed during the hospital encounter of 07/16/20 (from the past 24 hour(s))  Creatinine, serum     Status: None   Collection Time: 07/17/20  9:18 AM  Result Value Ref Range   Creatinine, Ser 0.95 0.61 - 1.24 mg/dL   GFR, Estimated >60 >60 mL/min  Magnesium     Status: None   Collection Time: 07/17/20  9:18 AM  Result Value Ref Range   Magnesium 2.1 1.7 - 2.4 mg/dL  Potassium     Status: None   Collection Time: 07/17/20  9:18 AM  Result Value Ref Range   Potassium 3.9 3.5 - 5.1 mmol/L    DG Knee Right Port  Result Date: 07/16/2020 CLINICAL DATA:  Postop for knee arthroplasty. EXAM: PORTABLE RIGHT KNEE - 1-2 VIEW COMPARISON:  None. FINDINGS: Right knee arthroplasty, without fracture or acute hardware complication. Expected fluid and gas within the joint. Overlying surgical staples. IMPRESSION:  Expected appearance after right knee arthroplasty. Electronically Signed   By: Abigail Miyamoto M.D.   On: 07/16/2020 12:02    Assessment/Plan: 1 Day Post-Op   Active Problems:   Total knee replacement status, right  Patient is doing well on postop day #1.  He is making good progress with physical therapy.  Patient is tolerating p.o. diet.  He is started back on his Pradaxa which will cover him for DVT prophylaxis.  Continue with physical therapy.  Patient will be discharged to [facility] rehab where he is a resident upon discharge.   Thornton Park , MD 07/17/2020, 5:44 PM

## 2020-07-17 NOTE — Consult Note (Signed)
Brief Pharmacy Note  Pharmacy consulted to review patient's home medication of  Tikosyn. Confirmed with patient no missed doses and last Tikosyn dose was today at ~0530. Per patient, he typically takes the first dose of Tikosyn around 0800. Confirmed with patient that he takes Tikosyn 125 mcg Q12H.   Will resume home dose of Tikosyn 125 mcg Q12H.   11/19  K 3.9 (patient is on scheduled KCL PO 10 mEq daily), Mg 2.1, Scr 0.95. No additional electrolyte replacement needed at this time. EKG results pending. Will continue to monitor and replace for goal of K > 4 and Mg > 2. QTc < 500 ms.   Thank you for allowing pharmacy to be a part of this patient's care.  Kristeen Miss, PharmD Clinical Pharmacist

## 2020-07-17 NOTE — Progress Notes (Signed)
Physical Therapy Treatment Patient Details Name: Terry Collins MRN: [id number] DOB: Jan 20, 1936 Today's Date: 07/17/2020    History of Present Illness Terry Collins is an 5yoM who comes to [facility] on 11/18 for elective TKA of Right laterality. PTA pt lived alone at the [place] over at [facility], used a 4WW to circumnavigate the living environment.    PT Comments    Pt in bed upon entry, reports being up for 2.5 hours after AM session. Pt given maxA to EOB for sake of time. Pt educated on seated HEP. Extensive training on STS training and technique from elevated surface, still struggles with coming to standing without physical assist. Pt AMB to doorway and back, gait remains generally very slow but steady, does increased pain. Pt utilizes bilateral flexed knee posture. DTR in room throughout session. Pt left in supine in bed HOB 15 degrees, KI donned, heel elevated, polar care activated. Will continue to follow.     Follow Up Recommendations  SNF;Supervision for mobility/OOB     Equipment Recommendations  Rolling walker with 5" wheels    Recommendations for Other Services       Precautions / Restrictions Precautions Precautions: Fall;Knee Required Braces or Orthoses: Knee Immobilizer - Right Knee Immobilizer - Right: On at all times;Other (comment) (can come off for PT and AMB) Restrictions RLE Weight Bearing: Weight bearing as tolerated    Mobility  Bed Mobility Overal bed mobility: Needs Assistance Bed Mobility: Supine to Sit;Sit to Supine     Supine to sit: Max assist Sit to supine: Max assist   General bed mobility comments: offered MaxA for time efficiency to allow time to work on transfers and gait; performed at Supervision level this morning, required 3-4 minutes.  Transfers Overall transfer level: Needs assistance Equipment used: Rolling walker (2 wheeled) Transfers: Sit to/from Stand Sit to Stand: From elevated surface;Min assist             Ambulation/Gait Ambulation/Gait assistance: Min guard Gait Distance (Feet): 40 Feet Assistive device: Rolling walker (2 wheeled) Gait Pattern/deviations: Step-to pattern;Decreased step length - left;Decreased weight shift to right;Shuffle Gait velocity: AMB to door and back requires 5-6 minutes; pt bradykinetic; 3-point step to gait, avg length ~8 inches   General Gait Details: Increased R knee flexion during heel strike on the R with foot flat contact.   Stairs             Wheelchair Mobility    Modified Rankin (Stroke Patients Only)       Balance                                            Cognition Arousal/Alertness: Awake/alert Behavior During Therapy: WFL for tasks assessed/performed Overall Cognitive Status: Within Functional Limits for tasks assessed                                        Exercises Total Joint Exercises Long Arc Quad: AAROM;Right;Left;10 reps;Seated Knee Flexion: AAROM;Right;Left;10 reps;Seated Other Exercises Other Exercises: STS training on elevated EOB; heavy UE support, weak in Left knee with palpable creaking, bradykinetic; performs 6x with reast breaks intermittently.    General Comments        Pertinent Vitals/Pain Pain Assessment: 0-10 Pain Score: 5  Pain Location: Rt knee Pain Descriptors / Indicators:  Aching Pain Intervention(s): Limited activity within patient's tolerance;Monitored during session;Premedicated before session;Repositioned    Home Living                      Prior Function            PT Goals (current goals can now be found in the care plan section) Acute Rehab PT Goals Patient Stated Goal: regain independent mobility PT Goal Formulation: With patient Time For Goal Achievement: 07/30/20 Potential to Achieve Goals: Fair Progress towards PT goals: Progressing toward goals    Frequency    BID      PT Plan Current plan remains appropriate     Co-evaluation              AM-PAC PT "6 Clicks" Mobility   Outcome Measure  Help needed turning from your back to your side while in a flat bed without using bedrails?: A Little Help needed moving from lying on your back to sitting on the side of a flat bed without using bedrails?: A Little Help needed moving to and from a bed to a chair (including a wheelchair)?: A Lot Help needed standing up from a chair using your arms (e.g., wheelchair or bedside chair)?: A Lot Help needed to walk in hospital room?: A Little Help needed climbing 3-5 steps with a railing? : A Lot 6 Click Score: 15    End of Session Equipment Utilized During Treatment: Gait belt Activity Tolerance: Patient tolerated treatment well;Patient limited by fatigue;No increased pain Patient left: with call bell/phone within reach;with family/visitor present;in bed;with SCD's reapplied Nurse Communication: Mobility status PT Visit Diagnosis: Unsteadiness on feet (R26.81);Difficulty in walking, not elsewhere classified (R26.2);Muscle weakness (generalized) (M62.81)     Time: 9563-8756 PT Time Calculation (min) (ACUTE ONLY): 43 min  Charges:  $Gait Training: 8-22 mins $Therapeutic Exercise: 8-22 mins $Therapeutic Activity: 8-22 mins                     3:36 PM, 07/17/20 [name], PT, DPT Physical Therapist - [facility]  [phone] ([place])    Diezel Mazur C 07/17/2020, 3:32 PM

## 2020-07-17 NOTE — TOC Initial Note (Signed)
Transition of Care [facility]) - Initial/Assessment Note    Patient Details  Name: Terry Collins MRN: [id number] Date of Birth: 11-04-1935  Transition of Care [facility] CM/SW Contact:    Shelbie Ammons, RN Phone Number: 07/17/2020, 9:12 AM  Clinical Narrative:   RNCM met with patient at bedside. Patient sitting up waiting on breakfast, alert and verbally responsive and reports to feeling well today. Patient is a resident of the independent living cottages at [place] and he reports it will be his plan to go into the skilled facility on the grounds at discharge. Patient denies any other significant needs at this time.  RNCM completed PASSR, FL-2 and sent bed request to [place].  RNCM will reach out to [facility] to inform them of same.                 Expected Discharge Plan: Skilled Nursing Facility Barriers to Discharge: Continued Medical Work up   Patient Goals and CMS Choice Patient states their goals for this hospitalization and ongoing recovery are:: Per patient I want to get to rehab so I can get back home.      Expected Discharge Plan and Services Expected Discharge Plan: [place] Choice: [place] arrangements for the past 2 months: Single Family Home                                      Prior Living Arrangements/Services Living arrangements for the past 2 months: Single Family Home Lives with:: Self Patient language and need for interpreter reviewed:: Yes Do you feel safe going back to the place where you live?: Yes      Need for Family Participation in Patient Care: Yes (Comment) Care giver support system in place?: Yes (comment)   Criminal Activity/Legal Involvement Pertinent to Current Situation/Hospitalization: No - Comment as needed  Activities of Daily Living      Permission Sought/Granted                  Emotional Assessment Appearance:: Appears stated  age Attitude/Demeanor/Rapport: Engaged Affect (typically observed): Appropriate, Calm Orientation: : Oriented to Self, Oriented to Place, Oriented to  Time, Oriented to Situation Alcohol / Substance Use: Not Applicable Psych Involvement: No (comment)  Admission diagnosis:  Total knee replacement status, right [Z96.651] Patient Active Problem List   Diagnosis Date Noted  . Total knee replacement status, right 07/16/2020   PCP:  Tracie Harrier, MD Pharmacy:   [facility] [phone] [address], [place] [address] Phone: [phone] Fax: [phone]     Social Determinants of Health (SDOH) Interventions    Readmission Risk Interventions No flowsheet data found.

## 2020-07-17 NOTE — Progress Notes (Signed)
Physical Therapy Treatment Patient Details Name: Terry Collins MRN: [id number] DOB: 03/13/1936 Today's Date: 07/17/2020    History of Present Illness Terry Collins is an 81yoM who comes to [facility] on 11/18 for elective TKA of Right laterality. PTA pt lived alone at the [place] over at [facility], used a 4WW to circumnavigate the living environment.    PT Comments    The pt is presenting with increased tolerance and improved independence with mobility this session. He is able to tolerate increased ambulation distances this session, however presents with decreased gait speed indicative of need for continued skilled therapies. The pt demonstrates improved R knee ROM and tolerance to activity demonstrated by reduced pain. He continues to be appropriate for STR. PT will continue to follow in order to optimize functional independence.     Follow Up Recommendations  SNF;Supervision for mobility/OOB     Equipment Recommendations  Rolling walker with 5" wheels    Recommendations for Other Services       Precautions / Restrictions Precautions Precautions: Fall;Knee Required Braces or Orthoses: Knee Immobilizer - Right Knee Immobilizer - Right: On at all times;Other (comment) (unless mobilizing) Restrictions Weight Bearing Restrictions: Yes RLE Weight Bearing: Weight bearing as tolerated    Mobility  Bed Mobility Overal bed mobility: Needs Assistance Bed Mobility: Supine to Sit     Supine to sit: Supervision     General bed mobility comments: Pt requiring increased time to achieve EOB.  Transfers Overall transfer level: Needs assistance Equipment used: Rolling walker (2 wheeled)   Sit to Stand: From elevated surface;Min assist         General transfer comment: sit<>stand: pt requring cues and some physical assistance for forward translation.  Ambulation/Gait Ambulation/Gait assistance: Min guard Gait Distance (Feet): 80 Feet Assistive device: Rolling walker (2  wheeled) Gait Pattern/deviations: Step-to pattern;Decreased step length - left;Decreased weight shift to right;Shuffle Gait velocity: 0.08   General Gait Details: Increased R knee flexion during heel strike on the R with foot flat contact.   Stairs             Wheelchair Mobility    Modified Rankin (Stroke Patients Only)       Balance Overall balance assessment: Modified Independent;No apparent balance deficits (not formally assessed)                                          Cognition                                              Exercises Total Joint Exercises Ankle Circles/Pumps: AROM;Both;15 reps;Supine Short Arc Quad: AROM;AAROM;Both;10 reps;Supine Heel Slides: AAROM;AROM;Both;15 reps;Supine Hip ABduction/ADduction: AROM;AAROM;Both;15 reps;Supine Goniometric ROM: Rt knee flexion ROM: 27-85 degrees    General Comments        Pertinent Vitals/Pain Pain Assessment: 0-10 Pain Score: 5  Pain Location: R knee following ambulation    Home Living Family/patient expects to be discharged to:: Private residence Living Arrangements: Alone                  Prior Function            PT Goals (current goals can now be found in the care plan section) Acute Rehab PT Goals Patient Stated Goal: regain independent mobility  PT Goal Formulation: With patient Time For Goal Achievement: 07/30/20 Potential to Achieve Goals: Fair Progress towards PT goals: Progressing toward goals    Frequency    BID      PT Plan Current plan remains appropriate    Co-evaluation              AM-PAC PT "6 Clicks" Mobility   Outcome Measure  Help needed turning from your back to your side while in a flat bed without using bedrails?: A Little Help needed moving from lying on your back to sitting on the side of a flat bed without using bedrails?: A Little Help needed moving to and from a bed to a chair (including a wheelchair)?: A  Little Help needed standing up from a chair using your arms (e.g., wheelchair or bedside chair)?: A Little Help needed to walk in hospital room?: A Little Help needed climbing 3-5 steps with a railing? : A Lot 6 Click Score: 17    End of Session Equipment Utilized During Treatment: Gait belt Activity Tolerance: Patient tolerated treatment well Patient left: in chair;with call bell/phone within reach;Other (comment);with family/visitor present Nurse Communication: Mobility status PT Visit Diagnosis: Unsteadiness on feet (R26.81);Difficulty in walking, not elsewhere classified (R26.2);Muscle weakness (generalized) (M62.81)     Time: 1601-0932 PT Time Calculation (min) (ACUTE ONLY): 40 min  Charges:  $Gait Training: 8-22 mins $Therapeutic Exercise: 23-37 mins                    11:39 AM, 07/17/20 Terry Collins A. Saverio Danker PT, DPT Physical Therapist - [facility]    Terry Collins 07/17/2020, 11:33 AM

## 2020-07-18 LAB — CREATININE, SERUM
Creatinine, Ser: 0.82 mg/dL (ref 0.61–1.24)
GFR, Estimated: >60 mL/min

## 2020-07-18 LAB — MAGNESIUM: Magnesium: 1.8 mg/dL (ref 1.7–2.4)

## 2020-07-18 LAB — CBC
HCT: 29.9 % — ABNORMAL LOW (ref 39.0–52.0)
Hemoglobin: 10.4 g/dL — ABNORMAL LOW (ref 13.0–17.0)
MCH: 34.4 pg — ABNORMAL HIGH (ref 26.0–34.0)
MCHC: 34.8 g/dL (ref 30.0–36.0)
MCV: 99 fL (ref 80.0–100.0)
Platelets: 149 10*3/uL — ABNORMAL LOW (ref 150–400)
RBC: 3.02 MIL/uL — ABNORMAL LOW (ref 4.22–5.81)
RDW: 13.6 % (ref 11.5–15.5)
WBC: 11.5 10*3/uL — ABNORMAL HIGH (ref 4.0–10.5)
nRBC: 0 % (ref 0.0–0.2)

## 2020-07-18 LAB — POTASSIUM: Potassium: 3.8 mmol/L (ref 3.5–5.1)

## 2020-07-18 MED ORDER — MAGNESIUM SULFATE 2 GM/50ML IV SOLN
2.0000 g | Freq: Once | INTRAVENOUS | Status: AC
  Administered 2020-07-18: 2 g via INTRAVENOUS
  Filled 2020-07-18: qty 50

## 2020-07-18 MED ORDER — POTASSIUM CHLORIDE CRYS ER 20 MEQ PO TBCR
30.0000 meq | EXTENDED_RELEASE_TABLET | Freq: Once | ORAL | Status: AC
  Administered 2020-07-18: 30 meq via ORAL
  Filled 2020-07-18: qty 2

## 2020-07-18 MED ORDER — FOLIC ACID 1 MG PO TABS
1.0000 mg | ORAL_TABLET | Freq: Every day | ORAL | Status: DC
  Administered 2020-07-19 – 2020-07-21 (×3): 1 mg via ORAL
  Filled 2020-07-18 (×3): qty 1

## 2020-07-18 NOTE — Progress Notes (Signed)
Physical Therapy Treatment Patient Details Name: Terry Collins MRN: [id number] DOB: Aug 22, 1936 Today's Date: 07/18/2020    History of Present Illness Terry Collins is an 38yoM who comes to [facility] on 11/18 for elective TKA of Right laterality. PTA pt lived alone at the [place] over at [facility], used a 4WW to circumnavigate the living environment.    PT Comments    Therapist was contacted by RN staff requesting assistance with getting pt up from recliner to [facility]. Pt required mod assist to stand with max vcs for technique and sequencing. Was able to take ~5 steps to [facility]. Pt requested sitting on commode for awhile and OT states she will assist pt after BM. Pt is progressing with PT overall but will benefit from short term rehab to improve strength, balance, and safe functional mobility while assisting pt to PLOF.     Follow Up Recommendations  SNF;Supervision for mobility/OOB     Equipment Recommendations  Rolling walker with 5" wheels    Recommendations for Other Services       Precautions / Restrictions Precautions Precautions: Fall;Knee Precaution Booklet Issued: Yes (comment) Required Braces or Orthoses: Knee Immobilizer - Right Knee Immobilizer - Right: On at all times;Other (comment) (except with PT) Restrictions Weight Bearing Restrictions: Yes RLE Weight Bearing: Weight bearing as tolerated    Mobility  Bed Mobility Overal bed mobility: Needs Assistance Bed Mobility: Sit to Supine       Sit to supine: Mod assist   General bed mobility comments: pt in recliner pre session and on BSC post session  Transfers Overall transfer level: Needs assistance Equipment used: Rolling walker (2 wheeled) Transfers: Sit to/from Stand Sit to Stand: Mod assist         General transfer comment: pt required mod assist to stand from recliner height surface. vcs for proper technique.  Ambulation/Gait Ambulation/Gait assistance: Min guard;Min assist Gait Distance (Feet): 5  Feet Assistive device: Rolling walker (2 wheeled) Gait Pattern/deviations: Step-to pattern;Decreased step length - left;Decreased weight shift to right;Shuffle Gait velocity: decreased   General Gait Details: distance limited by pt needing to have BM       Balance Overall balance assessment: Needs assistance Sitting-balance support: No upper extremity supported;Feet supported Sitting balance-Leahy Scale: Good     Standing balance support: Bilateral upper extremity supported Standing balance-Leahy Scale: Fair Standing balance comment: reliant on UEs to prevent LOB       Cognition Arousal/Alertness: Awake/alert Behavior During Therapy: WFL for tasks assessed/performed Overall Cognitive Status: Within Functional Limits for tasks assessed    General Comments: Pt is A and O x 4      Exercises Other Exercises Other Exercises: Pt educated re: OT role, polar care mgmt, d/c recs, DME recs, falls prevention Other Exercises: LBD, toileting, sit>sup, sit<>stand,         Pertinent Vitals/Pain Pain Assessment: 0-10 Pain Score: 5  Faces Pain Scale: Hurts little more Pain Location: Rt knee Pain Descriptors / Indicators: Aching Pain Intervention(s): Limited activity within patient's tolerance;Premedicated before session;Monitored during session;Repositioned;Ice applied    Home Living Family/patient expects to be discharged to:: Private residence Living Arrangements: Alone Available Help at Discharge: [place] Type of Home: [place]: One level Home Equipment: Environmental consultant - 4 wheels;Shower seat      Prior Function Level of Independence: Independent with assistive device(s)      Comments: MOD I using 4WW   PT Goals (current goals can now be found in the care plan  section) Acute Rehab PT Goals Patient Stated Goal: return to PLOF Progress towards PT goals: Progressing toward goals    Frequency    BID      PT Plan Current plan remains  appropriate       AM-PAC PT "6 Clicks" Mobility   Outcome Measure  Help needed turning from your back to your side while in a flat bed without using bedrails?: A Little Help needed moving from lying on your back to sitting on the side of a flat bed without using bedrails?: A Lot Help needed moving to and from a bed to a chair (including a wheelchair)?: A Lot Help needed standing up from a chair using your arms (e.g., wheelchair or bedside chair)?: A Lot Help needed to walk in hospital room?: A Lot Help needed climbing 3-5 steps with a railing? : A Lot 6 Click Score: 13    End of Session Equipment Utilized During Treatment: Gait belt Activity Tolerance: Patient tolerated treatment well Patient left: Other (comment) (on BSC/OT to assist pt once done with BM) Nurse Communication: Mobility status PT Visit Diagnosis: Unsteadiness on feet (R26.81);Difficulty in walking, not elsewhere classified (R26.2);Muscle weakness (generalized) (M62.81)     Time: 1301-1310 PT Time Calculation (min) (ACUTE ONLY): 9 min  Charges:  $Therapeutic Activity: 8-22 mins                     Julaine Fusi PTA 07/18/20, 4:29 PM

## 2020-07-18 NOTE — Progress Notes (Signed)
Subjective:  Patient reports pain as mild to moderate.    Objective:   VITALS:   Vitals:   07/17/20 1529 07/17/20 1530 07/17/20 1938 07/18/20 0016  BP: 131/77  139/77 (!) 141/71  Pulse: 74 71 82 78  Resp: 16  17 19   Temp: 97.7 F (36.5 C)  98.9 F (37.2 C) 97.8 F (36.6 C)  TempSrc:   Oral   SpO2: (!) 89% 95% 95% 94%  Weight:      Height:        PHYSICAL EXAM:  Neurologically intact ABD soft Neurovascular intact Sensation intact distally Intact pulses distally Dorsiflexion/Plantar flexion intact Incision: scant drainage No cellulitis present Compartment soft dressing changed today  LABS  Results for orders placed or performed during the hospital encounter of 07/16/20 (from the past 24 hour(s))  Creatinine, serum     Status: None   Collection Time: 07/17/20  9:18 AM  Result Value Ref Range   Creatinine, Ser 0.95 0.61 - 1.24 mg/dL   GFR, Estimated >60 >60 mL/min  Magnesium     Status: None   Collection Time: 07/17/20  9:18 AM  Result Value Ref Range   Magnesium 2.1 1.7 - 2.4 mg/dL  Potassium     Status: None   Collection Time: 07/17/20  9:18 AM  Result Value Ref Range   Potassium 3.9 3.5 - 5.1 mmol/L  CBC     Status: Abnormal   Collection Time: 07/18/20  6:39 AM  Result Value Ref Range   WBC 11.5 (H) 4.0 - 10.5 K/uL   RBC 3.02 (L) 4.22 - 5.81 MIL/uL   Hemoglobin 10.4 (L) 13.0 - 17.0 g/dL   HCT 29.9 (L) 39 - 52 %   MCV 99.0 80.0 - 100.0 fL   MCH 34.4 (H) 26.0 - 34.0 pg   MCHC 34.8 30.0 - 36.0 g/dL   RDW 13.6 11.5 - 15.5 %   Platelets 149 (L) 150 - 400 K/uL   nRBC 0.0 0.0 - 0.2 %  Potassium     Status: None   Collection Time: 07/18/20  6:39 AM  Result Value Ref Range   Potassium 3.8 3.5 - 5.1 mmol/L  Magnesium     Status: None   Collection Time: 07/18/20  6:39 AM  Result Value Ref Range   Magnesium 1.8 1.7 - 2.4 mg/dL  Creatinine, serum     Status: None   Collection Time: 07/18/20  6:39 AM  Result Value Ref Range   Creatinine, Ser 0.82 0.61 -  1.24 mg/dL   GFR, Estimated >60 >60 mL/min    DG Knee Right Port  Result Date: 07/16/2020 CLINICAL DATA:  Postop for knee arthroplasty. EXAM: PORTABLE RIGHT KNEE - 1-2 VIEW COMPARISON:  None. FINDINGS: Right knee arthroplasty, without fracture or acute hardware complication. Expected fluid and gas within the joint. Overlying surgical staples. IMPRESSION: Expected appearance after right knee arthroplasty. Electronically Signed   By: Abigail Miyamoto M.D.   On: 07/16/2020 12:02    Assessment/Plan: 2 Days Post-Op   Active Problems:   Total knee replacement status, right   Advance diet Up with therapy  Patient is doing well on postop day #2 He is making good progress with physical therapy.   Patient is tolerating p.o. diet.   He is started back on his Pradaxa which will cover him for DVT prophylaxis.   Continue with physical therapy.   Patient will be discharged to [facility] rehab where he is a resident upon discharge. Will follow up in  2 weeks for staple removal.   Please call to confirm appointment.[place]  Carlynn Spry , PA-C 07/18/2020, 8:36 AM

## 2020-07-18 NOTE — Progress Notes (Addendum)
Physical Therapy Treatment Patient Details Name: Terry Collins MRN: [id number] DOB: 11-Feb-1936 Today's Date: 07/18/2020    History of Present Illness Terry Collins is an 19yoM who comes to [facility] on 11/18 for elective TKA of Right laterality. PTA pt lived alone at the [place] over at [facility], used a 4WW to circumnavigate the living environment.    PT Comments    Pt was long sitting in bed with supportive daughter at bedside. Pt reports not feeling as well today as previous date. He was agreeable to OOB. Max assist to exit R side of bed. Mod assist to stand to RW. Was able to take steps to recliner with very slow step to antalgic gfait sequencing. Vcs throughout for lateral wt shift to improve step quality. He reports performing HEP but was unwilling to perform at this time. Pt will benefit from SNF at DC to address deficits and improve safe functional abilities. At conclusion of session, pt was in recliner with call bell in reach and RN aware of his abilities.    Follow Up Recommendations  SNF;Supervision for mobility/OOB     Equipment Recommendations  Rolling walker with 5" wheels    Recommendations for Other Services       Precautions / Restrictions Precautions Precautions: Fall;Knee Precaution Booklet Issued: Yes (comment) Required Braces or Orthoses: Knee Immobilizer - Right Knee Immobilizer - Right: On at all times;Other (comment) (except for PT ) Restrictions Weight Bearing Restrictions: Yes RLE Weight Bearing: Weight bearing as tolerated    Mobility  Bed Mobility Overal bed mobility: Needs Assistance Bed Mobility: Supine to Sit     Supine to sit: Max assist        Transfers Overall transfer level: Needs assistance Equipment used: Rolling walker (2 wheeled) Transfers: Sit to/from Stand Sit to Stand: From elevated surface;Mod assist         General transfer comment: mod assist to stand from EOB to RW and from [facility]  Ambulation/Gait Ambulation/Gait  assistance: Min guard;Min assist Gait Distance (Feet): 5 Feet Assistive device: Rolling walker (2 wheeled) Gait Pattern/deviations: Step-to pattern;Decreased step length - left;Decreased weight shift to right;Shuffle Gait velocity: decreased   General Gait Details: step to antalgic gait pattern with very slow cadance          Cognition Arousal/Alertness: Awake/alert Behavior During Therapy: WFL for tasks assessed/performed Overall Cognitive Status: Within Functional Limits for tasks assessed      General Comments: Pt is A and O x 4             Pertinent Vitals/Pain Pain Assessment: 0-10 Pain Score: 6  Pain Location: Rt knee Pain Descriptors / Indicators: Aching Pain Intervention(s): Limited activity within patient's tolerance;Monitored during session;Premedicated before session;Repositioned           PT Goals (current goals can now be found in the care plan section) Acute Rehab PT Goals Patient Stated Goal: regain independent mobility Progress towards PT goals: Progressing toward goals    Frequency    BID      PT Plan Current plan remains appropriate       AM-PAC PT "6 Clicks" Mobility   Outcome Measure  Help needed turning from your back to your side while in a flat bed without using bedrails?: A Little Help needed moving from lying on your back to sitting on the side of a flat bed without using bedrails?: A Lot Help needed moving to and from a bed to a chair (including a wheelchair)?: A Lot Help needed standing  up from a chair using your arms (e.g., wheelchair or bedside chair)?: A Lot Help needed to walk in hospital room?: A Lot Help needed climbing 3-5 steps with a railing? : A Lot 6 Click Score: 13    End of Session Equipment Utilized During Treatment: Gait belt Activity Tolerance: Patient tolerated treatment well Patient left: in chair;with call bell/phone within reach;with chair alarm set;with nursing/sitter in room Nurse Communication:  Mobility status PT Visit Diagnosis: Unsteadiness on feet (R26.81);Difficulty in walking, not elsewhere classified (R26.2);Muscle weakness (generalized) (M62.81)     Time: 3887-1959 PT Time Calculation (min) (ACUTE ONLY): 31 min  Charges:  $Gait Training: 8-22 mins $Therapeutic Activity: 8-22 mins                     Terry Collins PTA 07/18/20, 2:15 PM

## 2020-07-18 NOTE — Evaluation (Signed)
Occupational Therapy Evaluation Patient Details Name: Terry Collins MRN: [id number] DOB: [birth year] Today's Date: 07/18/2020    History of Present Illness Terry Collins is an 68yoM who comes to [facility] on 11/18 for elective TKA of Right laterality. PTA pt lived alone at the [place] over at [facility], used a 4WW to circumnavigate the living environment.   Clinical Impression   Terry Collins was seen for OT evaluation this date. Prior to hospital admission, pt was MOD I for mobility and ADLs using 4WW. Pt lives alone at [place] apartments at [facility]. Pt presents to acute OT demonstrating impaired ADL performance and functional mobility 2/2 decreased LB access, functional strength/balance deficits, and decreased activity tolerance. Pt currently requires SUPERVISION toileting at [facility] - MIN A for perihygiene c lateral leans. MIN A + RW for ADL t/f. Pt instructed in polar care mgt, falls prevention strategies, home/routines modifications, DME/AE for LB bathing and dressing tasks, and compression stocking mgt. Pt would benefit from skilled OT to address noted impairments and functional limitations (see below for any additional details) in order to maximize safety and independence while minimizing falls risk and caregiver burden. Upon hospital discharge, recommend STR to maximize pt safety and return to PLOF.       Follow Up Recommendations  SNF    Equipment Recommendations  Other (comment) (TBD)    Recommendations for Other Services       Precautions / Restrictions Precautions Precautions: Fall;Knee Precaution Booklet Issued: Yes (comment) Required Braces or Orthoses: Knee Immobilizer - Right Knee Immobilizer - Right: On at all times;Other (comment) (except for PT) Restrictions Weight Bearing Restrictions: Yes RLE Weight Bearing: Weight bearing as tolerated      Mobility Bed Mobility Overal bed mobility: Needs Assistance Bed Mobility: Sit to Supine   Sit to supine: Mod assist    General bed mobility comments: assist for BLE     Transfers Overall transfer level: Needs assistance Equipment used: Rolling walker (2 wheeled) Transfers: Sit to/from Stand Sit to Stand: Mod assist;From elevated surface         General transfer comment: assist for lift off and to achieve upright posture to rise from [facility]    Balance Overall balance assessment: Needs assistance Sitting-balance support: No upper extremity supported;Feet supported Sitting balance-Leahy Scale: Good     Standing balance support: Bilateral upper extremity supported Standing balance-Leahy Scale: Poor          ADL either performed or assessed with clinical judgement   ADL Overall ADL's : Needs assistance/impaired        General ADL Comments: SUPERVISION toileting at [facility] - MIN A for perihygiene c lateral leans. MIN A + RW for ADL t/f                   Pertinent Vitals/Pain Pain Assessment: Faces Pain Score: 6  Faces Pain Scale: Hurts little more Pain Location: Rt knee Pain Descriptors / Indicators: Aching Pain Intervention(s): Limited activity within patient's tolerance;Monitored during session;Repositioned     Hand Dominance Right   Extremity/Trunk Assessment Upper Extremity Assessment Upper Extremity Assessment: Generalized weakness   Lower Extremity Assessment Lower Extremity Assessment: Generalized weakness       Communication Communication Communication: No difficulties   Cognition Arousal/Alertness: Awake/alert Behavior During Therapy: WFL for tasks assessed/performed Overall Cognitive Status: Within Functional Limits for tasks assessed      General Comments: Pt is A and O x 4   General Comments       Exercises Exercises: Other  exercises Other Exercises Other Exercises: Pt educated re: OT role, polar care mgmt, d/c recs, DME recs, falls prevention Other Exercises: LBD, toileting, sit>sup, sit<>stand,    Shoulder Instructions      Home Living Family/patient  expects to be discharged to:: Private residence Living Arrangements: Alone Available Help at Discharge: [place] Type of Home: [place]: One level               Home Equipment: Environmental consultant - 4 wheels;Shower seat          Prior Functioning/Environment Level of Independence: Independent with assistive device(s)        Comments: MOD I using 4WW        OT Problem List: Decreased strength;Decreased range of motion;Decreased activity tolerance;Impaired balance (sitting and/or standing);Decreased safety awareness      OT Treatment/Interventions: Self-care/ADL training;Therapeutic exercise;Energy conservation;DME and/or AE instruction;Therapeutic activities;Balance training;Patient/family education    OT Goals(Current goals can be found in the care plan section) Acute Rehab OT Goals Patient Stated Goal: return to PLOF OT Goal Formulation: With patient/family Time For Goal Achievement: 08/01/20 Potential to Achieve Goals: Good ADL Goals Pt Will Perform Grooming: with modified independence;sitting Pt Will Perform Lower Body Dressing: with min assist;sit to/from stand (c LRAD PRN) Pt Will Transfer to Toilet: with modified independence;ambulating;bedside commode (c LRAD PRN)  OT Frequency: Min 1X/week   Barriers to D/C: Decreased caregiver support             AM-PAC OT "6 Clicks" Daily Activity     Outcome Measure Help from another person eating meals?: None Help from another person taking care of personal grooming?: A Little Help from another person toileting, which includes using toliet, bedpan, or urinal?: A Lot Help from another person bathing (including washing, rinsing, drying)?: A Lot Help from another person to put on and taking off regular upper body clothing?: A Little Help from another person to put on and taking off regular lower body clothing?: A Lot 6 Click Score: 16   End of Session Equipment Utilized During Treatment:  Rolling walker Nurse Communication: Mobility status  Activity Tolerance: Patient tolerated treatment well Patient left: in bed;with call bell/phone within reach;with bed alarm set;with family/visitor present  OT Visit Diagnosis: Other abnormalities of gait and mobility (R26.89)                Time: 9628-3662 OT Time Calculation (min): 30 min Charges:  OT General Charges $OT Visit: 1 Visit OT Evaluation $OT Eval Low Complexity: 1 Low OT Treatments $Self Care/Home Management : 8-22 mins $Therapeutic Activity: 8-22 mins  Dessie Coma, M.S. OTR/L  07/18/20, 2:52 PM  ascom [phone]

## 2020-07-19 LAB — BASIC METABOLIC PANEL
Anion gap: 9 (ref 5–15)
BUN: 22 mg/dL (ref 8–23)
CO2: 29 mmol/L (ref 22–32)
Calcium: 8.9 mg/dL (ref 8.9–10.3)
Chloride: 98 mmol/L (ref 98–111)
Creatinine, Ser: 0.74 mg/dL (ref 0.61–1.24)
GFR, Estimated: >60 mL/min (ref >60)
Glucose, Bld: 139 mg/dL — ABNORMAL HIGH (ref 70–99)
Potassium: 4 mmol/L (ref 3.5–5.1)
Sodium: 136 mmol/L (ref 135–145)

## 2020-07-19 LAB — MAGNESIUM: Magnesium: 2.1 mg/dL (ref 1.7–2.4)

## 2020-07-19 MED ORDER — TRAMADOL HCL 50 MG PO TABS: 50.0000 mg | ORAL_TABLET | Freq: Four times a day (QID) | ORAL | 0 refills | Status: DC | PRN

## 2020-07-19 MED ORDER — DOCUSATE SODIUM 100 MG PO CAPS
100.0000 mg | ORAL_CAPSULE | Freq: Two times a day (BID) | ORAL | 0 refills | Status: DC
Start: 2020-07-19 — End: 2020-12-11

## 2020-07-19 NOTE — Discharge Summary (Signed)
Physician Discharge Summary  Patient ID: Terry Collins MRN: [id number] DOB/AGE: 82-Oct-1937 82 y.o.  Admit date: 07/16/2020 Discharge date: 07/19/2020  Admission Diagnoses:  Right Knee Osteoarthritis <principal problem not specified>  Discharge Diagnoses:  Right Knee Osteoarthritis Active Problems:   Total knee replacement status, right   Past Medical History:  Diagnosis Date  . Anemia   . Aortic valve insufficiency   . Arthritis    hands  . Atherosclerosis of aorta ([place])   . Cancer ([place])    skin precancerous areas have been removed  . Carotid stenosis   . History of kidney stones 2010  . Mixed hyperlipidemia   . Paroxysmal atrial fibrillation ([facility])   . Venous insufficiency of both lower extremities     Surgeries: Procedure(s): RIGHT TOTAL KNEE ARTHROPLASTY on 07/16/2020   Consultants (if any):   Discharged Condition: Improved  Hospital Course: Terry Collins is an 84 y.o. male who was admitted 07/16/2020 with a diagnosis of  Right Knee Osteoarthritis <principal problem not specified> and went to the operating room on 07/16/2020 and underwent the above named procedures.    He was given perioperative antibiotics:  Anti-infectives (From admission, onward)   Start     Dose/Rate Route Frequency Ordered Stop   07/16/20 1400  ceFAZolin (ANCEF) IVPB 1 g/50 mL premix        1 g 100 mL/hr over 30 Minutes Intravenous Every 6 hours 07/16/20 1304 07/16/20 2134   07/16/20 0600  ceFAZolin (ANCEF) IVPB 2g/100 mL premix        2 g 200 mL/hr over 30 Minutes Intravenous On call to O.R. 07/15/20 2228 07/16/20 0827   07/15/20 2230  clindamycin (CLEOCIN) IVPB 600 mg        600 mg 100 mL/hr over 30 Minutes Intravenous  Once 07/15/20 2228 07/16/20 0839    .  He was given sequential compression devices, early ambulation, and Pradaxa for DVT prophylaxis.  He benefited maximally from the hospital stay and there were no complications.    Recent vital signs:  Vitals:   07/19/20 0827  07/19/20 1155  BP: 133/81 115/68  Pulse: 75 69  Resp: 17 16  Temp: 97.8 F (36.6 C) 97.6 F (36.4 C)  SpO2:  95%    Recent laboratory studies:  Lab Results  Component Value Date   HGB 10.4 (L) 07/18/2020   Lab Results  Component Value Date   WBC 11.5 (H) 07/18/2020   PLT 149 (L) 07/18/2020   Lab Results  Component Value Date   INR 1.3 (H) 07/07/2020   Lab Results  Component Value Date   NA 136 07/19/2020   K 4.0 07/19/2020   CL 98 07/19/2020   CO2 29 07/19/2020   BUN 22 07/19/2020   CREATININE 0.74 07/19/2020   GLUCOSE 139 (H) 07/19/2020    Discharge Medications:   Allergies as of 07/19/2020   No Known Allergies     Medication List    TAKE these medications   cycloSPORINE 0.05 % ophthalmic emulsion Commonly known as: RESTASIS Place 1 drop into both eyes 2 (two) times daily.   dabigatran 150 MG Caps capsule Commonly known as: PRADAXA Take 150 mg by mouth 2 (two) times daily.   docusate sodium 100 MG capsule Commonly known as: COLACE Take 1 capsule (100 mg total) by mouth 2 (two) times daily.   dofetilide 125 MCG capsule Commonly known as: TIKOSYN Take 125 mcg by mouth in the morning and at bedtime.   ferrous sulfate 325 (65 FE)  MG tablet Take 325 mg by mouth daily with breakfast.   folic acid 637 MCG tablet Commonly known as: FOLVITE Take 800 mcg by mouth daily.   Klor-Con Sprinkle 10 MEQ CR capsule Generic drug: potassium chloride Take 10 mEq by mouth daily.   metroNIDAZOLE 1 % gel Commonly known as: METROGEL Apply 1 application topically at bedtime.   multivitamin with minerals Tabs tablet Take 1 tablet by mouth daily.   Nocdurna 55.3 MCG Subl Generic drug: Desmopressin Acetate Place 55.3 mcg under the tongue at bedtime.   traMADol 50 MG tablet Commonly known as: ULTRAM Take 1 tablet (50 mg total) by mouth every 6 (six) hours as needed.   vitamin B-12 1000 MCG tablet Commonly known as: CYANOCOBALAMIN Take 1,000 mcg by mouth  daily.       Diagnostic Studies: Chest 2 View  Result Date: 07/08/2020 CLINICAL DATA:  Preop evaluation for total knee replacement EXAM: CHEST - 2 VIEW COMPARISON:  None available FINDINGS: Mild hyperinflation noted with bilateral apical pleuroparenchymal scarring. Background COPD suspected. No superimposed acute airspace process, collapse or consolidation. Negative for edema, effusion or pneumothorax. Trachea midline. Mild cardiomegaly. Aorta is atherosclerotic and tortuous. No acute osseous finding. 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 Cardiomegaly Aortic Atherosclerosis (ICD10-I70.0). Electronically Signed   By: Jerilynn Mages.  Shick M.D.   On: 07/08/2020 10:58   DG Knee Right Port  Result Date: 07/16/2020 CLINICAL DATA:  Postop for knee arthroplasty. EXAM: PORTABLE RIGHT KNEE - 1-2 VIEW COMPARISON:  None. FINDINGS: Right knee arthroplasty, without fracture or acute hardware complication. Expected fluid and gas within the joint. Overlying surgical staples. IMPRESSION: Expected appearance after right knee arthroplasty. Electronically Signed   By: Abigail Miyamoto M.D.   On: 07/16/2020 12:02    Disposition: Discharge disposition: 03-Skilled Nursing Facility            Signed: Lovell Sheehan ,MD 07/19/2020, 12:29 PM

## 2020-07-19 NOTE — Discharge Instructions (Signed)
The patient may continue to bear weight on the right lower extremity with use of a walker. The patient should continue to use TED stockings until follow-up. Patient should remove the TED stockings at night for sleep. The patient needs to continue to elevate the right lower extremity whenever possible. The knee immobilizer should be used at night. The patient may remove the knee immobilizer to perform exercises or sit in a chair during the day.  Patient should not place a pillow under their knee. The Polar Care may be used by the patient for comfort.  The dressing should remain on until follow up in the office.   The patient must cover the right knee dressing/incision during showers with a plastic bag or Saran wrap.  The patient will take Pradaxa a day for blood clot prevention and continue to work on knee range of motion exercises at home as instructed by physical therapy until follow-up in the office.

## 2020-07-19 NOTE — TOC Progression Note (Signed)
Transition of Care [facility]) - Progression Note    Patient Details  Name: Terry Collins MRN: [id number] Date of Birth: 12-14-1935  Transition of Care [facility]) CM/SW Contact  Boris Sharper, [place] Phone Number: 07/19/2020, 3:24 PM  Clinical Narrative:    CSW recieved a call from pt's RN stating that pt was ready for discharge and waiting on EMS, [place] notified RN that [facility] could not accept the pt until Monday. RN asked CSW to speak with pt's family. CSW contacted pt's daughter and notified her of this and she stated that she  has been in contact with [facility] and they were working to see if  They could accept the pt today. CSW called [facility] at [facility] and she confirmed that they are not able to accept the pt until Monday. CSW notified MD, RN and pt's daughter.     Expected Discharge Plan: [place] Barriers to Discharge: Continued Medical Work up  Expected Discharge Plan and Services Expected Discharge Plan: [place]: [place] arrangements for the past 2 months: Single Family Home Expected Discharge Date: 07/19/20                                     Social Determinants of Health (SDOH) Interventions    Readmission Risk Interventions No flowsheet data found.

## 2020-07-19 NOTE — Progress Notes (Signed)
Physical Therapy Treatment Patient Details Name: Terry Collins MRN: [id number] DOB: March 06, 1936 Today's Date: 07/19/2020    History of Present Illness Terry Collins is an 34yoM who comes to [facility] on 11/18 for elective TKA of Right laterality. PTA pt lived alone at the [place] over at [facility], used a 4WW to circumnavigate the living environment.    PT Comments    Ready for session.  Pt had just returned from walking to bathroom with tech but ready for session.  Participated in exercises as described below.  To EOB with increased time and min a x 1.  Steady in sitting,  Stood with mod a x 1 and is able to complete SLR, marches and ab/add in standing x 10. He does have occasionally left lean/lLOB that requires assist to recover.  While he stated he feels good and wants to walk he struggles to get to chair due to fatigue.  Remained up for lunch.   Follow Up Recommendations  SNF;Supervision for mobility/OOB     Equipment Recommendations  Rolling walker with 5" wheels    Recommendations for Other Services       Precautions / Restrictions Precautions Precautions: Fall;Knee Precaution Booklet Issued: Yes (comment) Required Braces or Orthoses: Knee Immobilizer - Right Knee Immobilizer - Right: On at all times;Other (comment) (except with PT) Restrictions Weight Bearing Restrictions: Yes RLE Weight Bearing: Weight bearing as tolerated    Mobility  Bed Mobility Overal bed mobility: Needs Assistance Bed Mobility: Supine to Sit     Supine to sit: Min assist;Mod assist        Transfers Overall transfer level: Needs assistance Equipment used: Rolling walker (2 wheeled) Transfers: Sit to/from Stand Sit to Stand: Mod assist            Ambulation/Gait Ambulation/Gait assistance: Min assist Gait Distance (Feet): 3 Feet Assistive device: Rolling walker (2 wheeled) Gait Pattern/deviations: Step-to pattern;Decreased step length - left;Decreased weight shift to  right;Shuffle Gait velocity: decreased   General Gait Details: pt juist returned from bathroom with nurse tech and fatigued so further gait deferred at this time   Stairs             Wheelchair Mobility    Modified Rankin (Stroke Patients Only)       Balance Overall balance assessment: Needs assistance Sitting-balance support: No upper extremity supported;Feet supported Sitting balance-Leahy Scale: Good   Postural control: Left lateral lean Standing balance support: Bilateral upper extremity supported Standing balance-Leahy Scale: Poor Standing balance comment: reliant on UEs to prevent LOB                            Cognition Arousal/Alertness: Awake/alert Behavior During Therapy: WFL for tasks assessed/performed Overall Cognitive Status: Within Functional Limits for tasks assessed                                 General Comments: Pt is A and O x 4      Exercises Total Joint Exercises Ankle Circles/Pumps: AROM;Both;15 reps;Supine Quad Sets: AROM;Both;10 reps;Supine Short Arc Quad: AROM;AAROM;Both;10 reps;Supine Heel Slides: AAROM;AROM;Both;15 reps;Supine Hip ABduction/ADduction: AROM;AAROM;Both;15 reps;Supine Straight Leg Raises: AAROM;Both;10 reps;Supine Goniometric ROM: 3-95    General Comments        Pertinent Vitals/Pain Pain Assessment: Faces Faces Pain Scale: Hurts little more Pain Location: Rt knee Pain Descriptors / Indicators: Aching Pain Intervention(s): Limited activity within patient's tolerance;Monitored during session;Premedicated  before session;Repositioned;Ice applied    Home Living                      Prior Function            PT Goals (current goals can now be found in the care plan section) Progress towards PT goals: Progressing toward goals    Frequency    BID      PT Plan Current plan remains appropriate    Co-evaluation              AM-PAC PT "6 Clicks" Mobility    Outcome Measure  Help needed turning from your back to your side while in a flat bed without using bedrails?: A Little Help needed moving from lying on your back to sitting on the side of a flat bed without using bedrails?: A Lot Help needed moving to and from a bed to a chair (including a wheelchair)?: A Lot Help needed standing up from a chair using your arms (e.g., wheelchair or bedside chair)?: A Lot Help needed to walk in hospital room?: A Lot Help needed climbing 3-5 steps with a railing? : A Lot 6 Click Score: 13    End of Session Equipment Utilized During Treatment: Gait belt Activity Tolerance: Patient tolerated treatment well Patient left: Other (comment) (on BSC/OT to assist pt once done with BM) Nurse Communication: Mobility status PT Visit Diagnosis: Unsteadiness on feet (R26.81);Difficulty in walking, not elsewhere classified (R26.2);Muscle weakness (generalized) (M62.81)     Time: 0802-2336 PT Time Calculation (min) (ACUTE ONLY): 24 min  Charges:  $Therapeutic Exercise: 8-22 mins $Therapeutic Activity: 8-22 mins                    Chesley Noon, PTA 07/19/20, 11:35 AM

## 2020-07-20 ENCOUNTER — Encounter
Admission: RE | Admit: 2020-07-20 | Discharge: 2020-07-20 | Disposition: A | Discharge status: Home / Self Care | Payer: Medicare Other | Source: Ambulatory Visit | Attending: Internal Medicine | Admitting: Internal Medicine

## 2020-07-20 ENCOUNTER — Encounter: Payer: Self-pay | Admitting: Orthopedic Surgery

## 2020-07-20 ENCOUNTER — Inpatient Hospital Stay: Payer: Medicare Other

## 2020-07-20 DIAGNOSIS — D72829 Elevated white blood cell count, unspecified: Secondary | ICD-10-CM | POA: Diagnosis not present

## 2020-07-20 DIAGNOSIS — D649 Anemia, unspecified: Secondary | ICD-10-CM

## 2020-07-20 DIAGNOSIS — Z96651 Presence of right artificial knee joint: Secondary | ICD-10-CM

## 2020-07-20 DIAGNOSIS — G9341 Metabolic encephalopathy: Secondary | ICD-10-CM | POA: Diagnosis not present

## 2020-07-20 DIAGNOSIS — I480 Paroxysmal atrial fibrillation: Secondary | ICD-10-CM | POA: Diagnosis present

## 2020-07-20 LAB — BASIC METABOLIC PANEL
Anion gap: 11 (ref 5–15)
BUN: 26 mg/dL — ABNORMAL HIGH (ref 8–23)
CO2: 29 mmol/L (ref 22–32)
Calcium: 8.9 mg/dL (ref 8.9–10.3)
Chloride: 96 mmol/L — ABNORMAL LOW (ref 98–111)
Creatinine, Ser: 0.73 mg/dL (ref 0.61–1.24)
GFR, Estimated: >60 mL/min (ref >60)
Glucose, Bld: 133 mg/dL — ABNORMAL HIGH (ref 70–99)
Potassium: 3.7 mmol/L (ref 3.5–5.1)
Sodium: 136 mmol/L (ref 135–145)

## 2020-07-20 LAB — CBC
HCT: 30.7 % — ABNORMAL LOW (ref 39.0–52.0)
Hemoglobin: 10.2 g/dL — ABNORMAL LOW (ref 13.0–17.0)
MCH: 33.4 pg (ref 26.0–34.0)
MCHC: 33.2 g/dL (ref 30.0–36.0)
MCV: 100.7 fL — ABNORMAL HIGH (ref 80.0–100.0)
Platelets: 182 10*3/uL (ref 150–400)
RBC: 3.05 MIL/uL — ABNORMAL LOW (ref 4.22–5.81)
RDW: 13.4 % (ref 11.5–15.5)
WBC: 10.8 10*3/uL — ABNORMAL HIGH (ref 4.0–10.5)
nRBC: 0 % (ref 0.0–0.2)

## 2020-07-20 LAB — URINALYSIS, COMPLETE (UACMP) WITH MICROSCOPIC
Bacteria, UA: NONE SEEN
Bilirubin Urine: NEGATIVE
Glucose, UA: NEGATIVE mg/dL
Ketones, ur: NEGATIVE mg/dL
Leukocytes,Ua: NEGATIVE
Nitrite: NEGATIVE
Protein, ur: 30 mg/dL — AB
Specific Gravity, Urine: 1.019 (ref 1.005–1.030)
pH: 6 (ref 5.0–8.0)

## 2020-07-20 LAB — AMMONIA: Ammonia: 16 umol/L (ref 9–35)

## 2020-07-20 MED ORDER — CYCLOBENZAPRINE HCL 5 MG PO TABS
7.5000 mg | ORAL_TABLET | Freq: Three times a day (TID) | ORAL | Status: DC | PRN
  Administered 2020-07-20: 7.5 mg via ORAL
  Filled 2020-07-20 (×2): qty 1.5

## 2020-07-20 MED ORDER — HYDROXYZINE HCL 10 MG PO TABS
10.0000 mg | ORAL_TABLET | Freq: Three times a day (TID) | ORAL | Status: DC | PRN
  Filled 2020-07-20: qty 1

## 2020-07-20 MED ORDER — HYDROCODONE-ACETAMINOPHEN 5-325 MG PO TABS: 1.0000 | ORAL_TABLET | Freq: Four times a day (QID) | ORAL | Status: DC | PRN

## 2020-07-20 MED ORDER — POTASSIUM CHLORIDE CRYS ER 10 MEQ PO TBCR
10.0000 meq | EXTENDED_RELEASE_TABLET | Freq: Once | ORAL | Status: AC
  Administered 2020-07-20: 10 meq via ORAL
  Filled 2020-07-20: qty 1

## 2020-07-20 NOTE — Progress Notes (Addendum)
PT Cancellation Note  Patient Details Name: Aleksey Newbern MRN: [id number] DOB: 07-17-1936   Cancelled Treatment:    Reason Eval/Treat Not Completed: Fatigue/lethargy limiting ability to participate (Pt in bed, DTR bedside. Pt continues to have lethargy, mild confusion, falls asleep a few times while Pryor Curia gets updates from DTR.) Will hold PT at this time, allow patient to rest, as alertness and cognition have been impaired past 2 days. Will attempt treatment again later in day.    Austyn Perriello C 07/20/2020, 11:23 AM

## 2020-07-20 NOTE — Progress Notes (Signed)
  Subjective:  POD #4 s/p right total knee arthroplasty.   Patient reports right knee pain as mild.  Patient has returned to his baseline mentation.  His daughter is at the bedside.  Patient states he feels much better.  He is reported by the overnight RN to be confused, agitated and combative.  He was pulling at his right knee dressings.  Objective:   VITALS:   Vitals:   07/19/20 1619 07/19/20 2047 07/20/20 0442 07/20/20 0819  BP: (!) 147/74 (!) 151/77 (!) 153/80 134/72  Pulse: 75 71 78 76  Resp: 20 18 16 17   Temp: 98.2 F (36.8 C) 98.8 F (37.1 C) 98.3 F (36.8 C) 98.4 F (36.9 C)  TempSrc:      SpO2: 95% 98%  98%  Weight:      Height:        PHYSICAL EXAM: Right lower extremity: Aquacel dressing is in place along with an Ace wrap.  Patient's Polar Care is in place. Patient has no erythema, ecchymosis around his incision or drainage on his dressing. Neurovascular intact Sensation intact distally Intact pulses distally Dorsiflexion/Plantar flexion intact No cellulitis present Compartment soft  LABS  Results for orders placed or performed during the hospital encounter of 07/16/20 (from the past 24 hour(s))  Urinalysis, Complete w Microscopic Urine, Clean Catch     Status: Abnormal   Collection Time: 07/20/20  7:33 AM  Result Value Ref Range   Color, Urine YELLOW (A) YELLOW   APPearance HAZY (A) CLEAR   Specific Gravity, Urine 1.019 1.005 - 1.030   pH 6.0 5.0 - 8.0   Glucose, UA NEGATIVE NEGATIVE mg/dL   Hgb urine dipstick SMALL (A) NEGATIVE   Bilirubin Urine NEGATIVE NEGATIVE   Ketones, ur NEGATIVE NEGATIVE mg/dL   Protein, ur 30 (A) NEGATIVE mg/dL   Nitrite NEGATIVE NEGATIVE   Leukocytes,Ua NEGATIVE NEGATIVE   RBC / HPF 6-10 0 - 5 RBC/hpf   WBC, UA 0-5 0 - 5 WBC/hpf   Bacteria, UA NONE SEEN NONE SEEN   Squamous Epithelial / LPF 0-5 0 - 5   Mucus PRESENT     No results found.  Assessment/Plan: 4 Days Post-Op   Active Problems:   Total knee replacement  status, right  Patient had a urinalysis sent this morning.  This does not show evidence of UTI.  I have ordered a CBC and BMP for today.  I have also consulted the hospitalist and reviewed the case with Dr. Donna Bernard.  If the patient's work-up comes back negative and he is cleared medically, he may be discharged to [facility] later today.   Thornton Park , MD 07/20/2020, 9:23 AM

## 2020-07-20 NOTE — Progress Notes (Signed)
Pt confused and agitated at this time. Pt removed polar care and trying to remove ace wrap when this nurse walked into room. Pt cursing and asking "why are you doing this to me?" This nurse explained to pt he is in hospital and had knee surgery. Mittens placed on pt for safety.

## 2020-07-20 NOTE — Progress Notes (Signed)
Physical Therapy Treatment Patient Details Name: Terry Collins MRN: [id number] DOB: [birth year] Today's Date: 07/20/2020    History of Present Illness Terry Collins is an 14yoM who comes to [facility] on 11/18 for elective TKA of Right laterality. PTA pt lived alone at the [facility] over at [facility], used a 4WW to circumnavigate the living environment.    PT Comments    Pt in bed upon entry appears awake initially, but continues to drift in and out of sleep with heavy breathing, maintains eyes open most the time. Author continually engages patient with lots of verbal and tactile stimulation, but pt is difficult to keep awake. Author gets [facility] elevated to 58 degrees, lights on, but still mostly asleep. Pt puts forth efforts to participate when awake, but no more than 3 reps without falling asleep, and has clear weakness related to somnolence. Rt knee elevated in TKE at end of session, KI donned over polarcare. Pt left elevated in bed 55 degrees, DTR at bedside agreeable to monitor. VSS.   Follow Up Recommendations  SNF;Supervision for mobility/OOB     Equipment Recommendations  Rolling walker with 5" wheels    Recommendations for Other Services       Precautions / Restrictions Precautions Precautions: Fall;Knee Precaution Booklet Issued: Yes (comment) Required Braces or Orthoses: Knee Immobilizer - Right Knee Immobilizer - Right: On at all times;Other (comment) (ok for off with PT) Restrictions RLE Weight Bearing: Weight bearing as tolerated    Mobility  Bed Mobility               General bed mobility comments: deferred; pt too drowsy to consider safe  Transfers                    Ambulation/Gait                 Stairs             Wheelchair Mobility    Modified Rankin (Stroke Patients Only)       Balance                                            Cognition Arousal/Alertness: Lethargic;Suspect due to medications    Overall Cognitive Status:  (very somnolent, limiting ability to consistently participate)                                        Exercises Total Joint Exercises Short Arc Quad: AAROM;Both;Supine;15 reps;AROM General Exercises - Upper Extremity Shoulder Flexion: AAROM;Right;15 reps    General Comments        Pertinent Vitals/Pain Pain Assessment: Faces Faces Pain Scale: No hurt Pain Intervention(s): Limited activity within patient's tolerance;Monitored during session;Repositioned    Home Living                      Prior Function            PT Goals (current goals can now be found in the care plan section) Acute Rehab PT Goals Patient Stated Goal: return to PLOF PT Goal Formulation: With patient Time For Goal Achievement: 07/30/20 Potential to Achieve Goals: Fair Progress towards PT goals: Not progressing toward goals - comment    Frequency    BID  PT Plan Current plan remains appropriate    Co-evaluation              AM-PAC PT "6 Clicks" Mobility   Outcome Measure  Help needed turning from your back to your side while in a flat bed without using bedrails?: A Lot Help needed moving from lying on your back to sitting on the side of a flat bed without using bedrails?: A Lot Help needed moving to and from a bed to a chair (including a wheelchair)?: Total Help needed standing up from a chair using your arms (e.g., wheelchair or bedside chair)?: Total Help needed to walk in hospital room?: Total Help needed climbing 3-5 steps with a railing? : Total 6 Click Score: 8    End of Session   Activity Tolerance: Patient tolerated treatment well;No increased pain;Patient limited by lethargy Patient left: in bed;with family/visitor present;with call bell/phone within reach;with bed alarm set (HOB at 55 degrees, KI donned, Rt heel elevated a bunch) Nurse Communication: Mobility status PT Visit Diagnosis: Unsteadiness on feet  (R26.81);Difficulty in walking, not elsewhere classified (R26.2);Muscle weakness (generalized) (M62.81)     Time: 4136-4383 PT Time Calculation (min) (ACUTE ONLY): 32 min  Charges:  $Therapeutic Exercise: 8-22 mins $Therapeutic Activity: 8-22 mins                     2:50 PM, 07/20/20 Terry Collins, PT, DPT Physical Therapist - [facility]  [phone] ([place])     Terry Collins 07/20/2020, 2:47 PM

## 2020-07-20 NOTE — Consult Note (Addendum)
Brief Pharmacy Note  Pharmacy consulted to review patient's home medication of  Tikosyn. Confirmed with patient no missed doses and last Tikosyn dose was today at ~0530. Per patient, he typically takes the first dose of Tikosyn around 0800. Confirmed with patient that he takes Tikosyn 125 mcg Q12H.   Will resume home dose of Tikosyn 125 mcg Q12H.   11/22  K 3.7 (patient is on scheduled KCL PO 10 mEq daily), Scr 0.73. Will order KCL PO 10 mEq x1 dose.  EKG on 11/18 QT/QTc 442/490 ms. EKG on 11/22 410/509 ms. Patient already received morning dose. Will messaged consulted MD, Dr. Blaine Hamper, and cardiology (Dr. Clayborn Bigness) on whether current dose will be continued. Per Dr. Blaine Hamper, hold Tikosyn evening dose and will check EKG tomorrow morning. Additionally switch ondansetron to hydroxyzine (low rate of causing QTc prolongation).  Will continue to monitor and replace for goal of K > 4 and Mg > 2. QTc < 500 ms.   Thank you for allowing pharmacy to be a part of this patient's care.  Kristeen Miss, PharmD Clinical Pharmacist

## 2020-07-20 NOTE — Consult Note (Addendum)
Medical Consultation   Terry Collins  DPO:[id number]  DOB: [birth year]  DOA: 07/16/2020  PCP: Tracie Harrier, MD  Outpatient Specialists:    Requesting physician: -Dr. Mack Guise of Ortho   Reason for consultation: -Altered mental status   History of Present Illness: Terry Collins is an 84 y.o. male hyperlipidemia, venous insufficiency change, PAF on Pradaxa, aortic valve insufficiency, anemia, admitted by Dr. Mack Guise of Ortho for right knee replacement.  We are asked for consult due to altered mental status.  Patient is POD #4 s/p right total knee arthroplasty. Per Dr. Mack Guise, patient has been recovering normally from successful surgery.  He was found to be confused since last night.  Patient is agitated, combative and confused.  Normally patient is alert oriented x3.  When I saw patient on the floor, patient is confused, orientated to person and time, not to place. He has some pain in the right knee.  Per her daughter, patient has mild dry cough, but no chest pain or shortness breath.  Patient has nausea, but no vomiting, diarrhea or abdominal pain.  No symptoms of UTI.  Patient moves all extremities normally.  No facial droop or slurred speech.  Patient is taking  Nocdurna for enuresis at home.  Lab: Negative urinalysis, WBC 11.5, electrolytes renal function okay.  CT of the head is negative for acute intracranial abnormalities.  Review of Systems: Could not be reviewed accurately due to altered mental status.   Past Medical History: Past Medical History:  Diagnosis Date  . Anemia   . Aortic valve insufficiency   . Arthritis    hands  . Atherosclerosis of aorta ([place])   . Cancer ([place])    skin precancerous areas have been removed  . Carotid stenosis   . History of kidney stones 2010  . Mixed hyperlipidemia   . Paroxysmal atrial fibrillation (HCC)   . Venous insufficiency of both lower extremities     Past Surgical History: Past Surgical History:    Procedure Laterality Date  . CARDIAC ELECTROPHYSIOLOGY STUDY AND ABLATION  2019   [facility]  . EYE SURGERY Bilateral 2012   cataracts  . HERNIA REPAIR Left 2008   inguinal  . INGUINAL HERNIA REPAIR Right 06/24/2016   Procedure: HERNIA REPAIR INGUINAL ADULT;  Surgeon: Leonie Green, MD;  Location: [facility] ORS;  Service: General;  Laterality: Right;  . TOTAL KNEE ARTHROPLASTY Right 07/16/2020   Procedure: RIGHT TOTAL KNEE ARTHROPLASTY;  Surgeon: Thornton Park, MD;  Location: [facility] ORS;  Service: Orthopedics;  Laterality: Right;  . XI ROBOTIC ASSISTED INGUINAL HERNIA REPAIR WITH MESH Bilateral 01/20/2020   Procedure: XI ROBOTIC ASSISTED INGUINAL HERNIA REPAIR WITH MESH;  Surgeon: Herbert Pun, MD;  Location: [facility] ORS;  Service: General;  Laterality: Bilateral;     Allergies:  No Known Allergies   Social History:  reports that he has never smoked. He has never used smokeless tobacco. He reports current alcohol use of about 1.0 standard drink of alcohol per week. He reports that he does not use drugs.   Family History: Family History  Problem Relation Age of Onset  . Heart attack Father   . Atrial fibrillation Brother       Physical Exam: Vitals:   07/20/20 1042 07/20/20 1200 07/20/20 1315 07/20/20 1540  BP: (!) 155/92 (!) 171/88 (!) 149/87 (!) 159/79  Pulse: 89 81 83 73  Resp: 17 (!) 22  (!) 21  Temp: 99.6  F (37.6 C) 98.3 F (36.8 C)  98.4 F (36.9 C)  TempSrc: Oral Oral  Oral  SpO2: 97% 99% 94% 97%  Weight:      Height:       General: Not in acute distress HEENT: PERRL, EOMI, no scleral icterus, No JVD or bruit Cardiac: S1/S2, RRR, No murmurs, gallops or rubs Pulm: No rales, wheezing, rhonchi or rubs. Abd: Soft, nondistended, nontender, no rebound pain, no organomegaly, BS present Ext: 1+ edema. 1+DP/PT pulse bilaterally Musculoskeletal: s/p of right knee replacement, with some tenderness Skin: No rashes.  Neuro: -Confused, orientated to the person and  time, but not to the place. Cranial nerves II-XII grossly intact, moves all extremities normally. Psych: Patient is not psychotic, no suicidal or hemocidal ideation.      Data reviewed:  I have personally reviewed following labs and imaging studies Labs:  CBC: Recent Labs  Lab 07/18/20 0639 07/20/20 0921  WBC 11.5* 10.8*  HGB 10.4* 10.2*  HCT 29.9* 30.7*  MCV 99.0 100.7*  PLT 149* 097    Basic Metabolic Panel: Recent Labs  Lab 07/16/20 1519 07/16/20 1519 07/17/20 0918 07/17/20 0918 07/18/20 0639 07/18/20 0639 07/19/20 0429 07/20/20 0921  NA  --   --   --   --   --   --  136 136  K 4.0   < > 3.9   < > 3.8   < > 4.0 3.7  CL  --   --   --   --   --   --  98 96*  CO2  --   --   --   --   --   --  29 29  GLUCOSE  --   --   --   --   --   --  139* 133*  BUN  --   --   --   --   --   --  22 26*  CREATININE 0.99  --  0.95  --  0.82  --  0.74 0.73  CALCIUM  --   --   --   --   --   --  8.9 8.9  MG 2.0  --  2.1  --  1.8  --  2.1  --    < > = values in this interval not displayed.   GFR Estimated Creatinine Clearance: 74 mL/min (by C-G formula based on SCr of 0.73 mg/dL). Liver Function Tests: No results for input(s): AST, ALT, ALKPHOS, BILITOT, PROT, ALBUMIN in the last 168 hours. No results for input(s): LIPASE, AMYLASE in the last 168 hours. Recent Labs  Lab 07/20/20 0952  AMMONIA 16   Coagulation profile No results for input(s): INR, PROTIME in the last 168 hours.  Cardiac Enzymes: No results for input(s): CKTOTAL, CKMB, CKMBINDEX, TROPONINI in the last 168 hours. BNP: Invalid input(s): POCBNP CBG: No results for input(s): GLUCAP in the last 168 hours. D-Dimer No results for input(s): DDIMER in the last 72 hours. Hgb A1c No results for input(s): HGBA1C in the last 72 hours. Lipid Profile No results for input(s): CHOL, HDL, LDLCALC, TRIG, CHOLHDL, LDLDIRECT in the last 72 hours. Thyroid function studies No results for input(s): TSH, T4TOTAL, T3FREE,  THYROIDAB in the last 72 hours.  Invalid input(s): FREET3 Anemia work up No results for input(s): VITAMINB12, FOLATE, FERRITIN, TIBC, IRON, RETICCTPCT in the last 72 hours. Urinalysis    Component Value Date/Time   COLORURINE YELLOW (A) 07/20/2020 0733   APPEARANCEUR HAZY (A)  07/20/2020 0733   LABSPEC 1.019 07/20/2020 0733   PHURINE 6.0 07/20/2020 0733   GLUCOSEU NEGATIVE 07/20/2020 0733   HGBUR SMALL (A) 07/20/2020 0733   BILIRUBINUR NEGATIVE 07/20/2020 0733   [place] 07/20/2020 0733   PROTEINUR 30 (A) 07/20/2020 0733   NITRITE NEGATIVE 07/20/2020 [place] 07/20/2020 0733     Microbiology Recent Results (from the past 240 hour(s))  SARS CORONAVIRUS 2 (TAT 6-24 HRS) Nasopharyngeal Nasopharyngeal Swab     Status: None   Collection Time: 07/14/20 10:52 AM   Specimen: Nasopharyngeal Swab  Result Value Ref Range Status   SARS Coronavirus 2 NEGATIVE NEGATIVE Final    Comment: (NOTE) SARS-CoV-2 target nucleic acids are NOT DETECTED.  The SARS-CoV-2 RNA is generally detectable in upper and lower respiratory specimens during the acute phase of infection. Negative results do not preclude SARS-CoV-2 infection, do not rule out co-infections with other pathogens, and should not be used as the sole basis for treatment or other patient management decisions. Negative results must be combined with clinical observations, patient history, and epidemiological information. The expected result is Negative.  Fact Sheet for Patients: SugarRoll.be  Fact Sheet for Healthcare Providers: https://www.woods-mathews.com/  This test is not yet approved or cleared by the [place] FDA and  has been authorized for detection and/or diagnosis of SARS-CoV-2 by FDA under an Emergency Use Authorization (EUA). This EUA will remain  in effect (meaning this test can be used) for the duration of the COVID-19 declaration under Se  ction 564(b)(1) of the Act, 21 U.S.C. section 360bbb-3(b)(1), unless the authorization is terminated or revoked sooner.  Performed at [facility], [address], [place], [place] [address]        Inpatient Medications:   Scheduled Meds: . cycloSPORINE  1 drop Both Eyes BID  . dabigatran  150 mg Oral BID  . Desmopressin Acetate  55.3 mcg Sublingual QHS  . docusate sodium  100 mg Oral BID  . dofetilide  125 mcg Oral Q12H  . ferrous sulfate  325 mg Oral Q breakfast  . folic acid  1 mg Oral Daily  . metroNIDAZOLE  1 application Topical QHS  . multivitamin with minerals  1 tablet Oral Daily  . potassium chloride  10 mEq Oral Daily  . vitamin B-12  1,000 mcg Oral Daily   Continuous Infusions:    Radiological Exams on Admission: CT HEAD WO CONTRAST  Result Date: 07/20/2020 CLINICAL DATA:  Altered mental status/delirium. Four days status post total knee arthroplasty EXAM: CT HEAD WITHOUT CONTRAST TECHNIQUE: Contiguous axial images were obtained from the base of the skull through the vertex without intravenous contrast. COMPARISON:  None. FINDINGS: Brain: There is age related volume loss. There is no intracranial mass, hemorrhage, extra-axial fluid collection, or midline shift. There is slight decreased attenuation in the centra semiovale bilaterally, likely representing periventricular small vessel vascular disease. No acute appearing infarct evident. Vascular: No hyperdense vessel. No appreciable vascular calcification. Skull: The bony calvarium appears intact. Sinuses/Orbits: Opacification noted in the inferior right maxillary antrum. Small retention cyst in inferior left maxillary antrum. Mild mucosal thickening in several ethmoid air cells. Orbits appear symmetric bilaterally. Other: Visualized mastoid air cells are clear. IMPRESSION: Mild periventricular small vessel disease. Age related volume loss. No acute infarct evident. No mass or hemorrhage. Foci of paranasal sinus  disease noted. Electronically Signed   By: Lowella Grip III M.D.   On: 07/20/2020 10:31    Impression/Recommendations Principal Problem:  Total knee replacement status, right Active Problems:   Paroxysmal atrial fibrillation (HCC)   Leukocytosis   Acute metabolic encephalopathy   Normocytic anemia   S/P TKR (total knee replacement) using cement, right  Total knee replacement status, right: -Pain management per primary Ortho team -Patient is on Pradaxa for PAF  Paroxysmal atrial fibrillation [facility]): Patient is taking Pradaxa and Tikosyn.  EKG showed QTC 509 and bifascicular block -Continue Pradaxa -asked RN to hold Tikosyn tonight and repeat EKG in morning  Leukocytosis: WBC 11.5, no fever, likely reactive -Follow-up of CBC  Acute metabolic encephalopathy: Etiology is not clear.  CT head is negative for acute intracranial abnormalities. Ammonia level normal. No focal neuro deficit on physical examination.  Maybe due to delirium. -Patient is on Percocet as needed, will decrease frequency to 1 tablet every 6 hours as needed -Discontinue tramadol -Change Robaxin to as needed Flexeril for muscle spasm -Frequent neuro check  Normocytic anemia: Hemoglobin 10.4 -Follow-up by CBC -Continue iron supplement    Thank you for this consultation.  Our [facility] hospitalist team will follow the patient with you.   Time Spent: 35 min  Ivor Costa M.D. [facility] 07/20/2020, 5:26 PM

## 2020-07-20 NOTE — Plan of Care (Signed)
Pt's daughter contacted about pt's confusion this shift. Daughter concerned, message sent to Dr. Selina Cooley.  Problem: Pain Management: Goal: Pain level will decrease with appropriate interventions Outcome: Progressing   Problem: Skin Integrity: Goal: Will show signs of wound healing Outcome: Progressing   Problem: Clinical Measurements: Goal: Ability to maintain clinical measurements within normal limits will improve Outcome: Progressing Goal: Will remain free from infection Outcome: Progressing

## 2020-07-20 NOTE — Care Management Important Message (Signed)
Important Message  Patient Details  Name: Terry Collins MRN: [id number] Date of Birth: August 25, 1936   Medicare Important Message Given:  Yes     [name] 07/20/2020, 11:22 AM

## 2020-07-21 DIAGNOSIS — Z96651 Presence of right artificial knee joint: Secondary | ICD-10-CM | POA: Diagnosis not present

## 2020-07-21 LAB — MAGNESIUM: Magnesium: 1.9 mg/dL (ref 1.7–2.4)

## 2020-07-21 LAB — CREATININE, SERUM
Creatinine, Ser: 0.77 mg/dL (ref 0.61–1.24)
GFR, Estimated: >60 mL/min (ref >60)

## 2020-07-21 LAB — POTASSIUM: Potassium: 3.5 mmol/L (ref 3.5–5.1)

## 2020-07-21 MED ORDER — POTASSIUM CHLORIDE CRYS ER 20 MEQ PO TBCR
40.0000 meq | EXTENDED_RELEASE_TABLET | Freq: Once | ORAL | Status: AC
  Administered 2020-07-21: 40 meq via ORAL
  Filled 2020-07-21: qty 2

## 2020-07-21 MED ORDER — MAGNESIUM SULFATE IN D5W 1-5 GM/100ML-% IV SOLN
1.0000 g | Freq: Once | INTRAVENOUS | Status: AC
  Administered 2020-07-21: 1 g via INTRAVENOUS
  Filled 2020-07-21: qty 100

## 2020-07-21 MED ORDER — HYDROCODONE-ACETAMINOPHEN 5-325 MG PO TABS: 1.0000 | ORAL_TABLET | Freq: Four times a day (QID) | ORAL | 0 refills | Status: DC | PRN

## 2020-07-21 NOTE — Plan of Care (Signed)

## 2020-07-21 NOTE — Progress Notes (Signed)
PROGRESS NOTE    Terry Collins  ZJI:[id number] DOB: 1936/06/18 DOA: 07/16/2020 PCP: Tracie Harrier, MD  Brief Narrative:  84 y.o. male hyperlipidemia, venous insufficiency change, PAF on Pradaxa, aortic valve insufficiency, anemia, admitted by Dr. Mack Guise of Ortho for right knee replacement.  We are asked for consult due to altered mental status.  Patient currently postop day 5 status post total right knee arthroplasty.  Per orthopedics postoperative course has been uncomplicated up until 2 nights ago when the patient was found to be agitated combative and confused.  At baseline patient normally alert and oriented x3.  On initial evaluation by medicine patient was found to be confused.  He was alert and oriented to time and person, but not to place  11/23: Evaluated patient at bedside.  Daughter at bedside as well.  Patient is calm and answers all questions appropriately.  He is alert, oriented x3.  He is returned to baseline level of mentation.  Assessment & Plan:   Principal Problem:   Total knee replacement status, right Active Problems:   Paroxysmal atrial fibrillation (HCC)   Leukocytosis   Acute metabolic encephalopathy   Normocytic anemia   S/P TKR (total knee replacement) using cement, right  Acute metabolic encephalopathy: Suspect combination of medication effect and hospital-acquired delirium Tramadol has been stopped Frequency of Percocet decreased Robaxin changed to Flexeril Ammonia level normal CT head negative Recommendations: - Likely manifestation of medication effect and hospital-acquired delirium - Recommend discontinuation of tramadol - Recommend to avoid tramadol in geriatric population as it has a number of poorly understood interactions with other medications - If narcotics are needed for pain control recommend low-dose oxycodone, as low as 2.5 mg in order to achieve good pain control - No further inpatient diagnostics from a medicine standpoint -  Message sent to Dr. Mack Guise  Total knee replacement status, right: -Pain management per primary Ortho team -Patient is on Pradaxa for PAF  Paroxysmal atrial fibrillation ([place])  Patient is taking Pradaxa and Tikosyn.   EKG showed QTC 509 and bifascicular block Tikosyn held on 07/20/2020 Cardiologist contacted Per cardiology okay to resume Tikosyn at home dose  Leukocytosis:  WBC 11.5, decreased to 10.8 no fever, likely reactive -Medication for antibiotics  Normocytic anemia  Hemoglobin 10.4 -Follow-up by CBC -Continue iron supplement -No indication for transfusion   DVT prophylaxis: Per orthopedics Code Status: Full Family Communication: Daughter at bedside  disposition Plan: Per orthopedics   Consultants:   Hospitalist  Procedures:   Total knee arthroplasty  Antimicrobials:   None   Subjective: Patient seen and examined.  Alert, oriented x3.  Baseline level of mentation.  Pain well controlled  Objective: Vitals:   07/20/20 2044 07/20/20 2343 07/21/20 0418 07/21/20 0809  BP: (!) 162/96 (!) 176/98 (!) 145/96 (!) 138/93  Pulse: 92 76 100 97  Resp: 18 17 18 17   Temp: 98 F (36.7 C) 98.2 F (36.8 C) 98.1 F (36.7 C) 98 F (36.7 C)  TempSrc: Oral   Oral  SpO2: (!) 88% (!) 78% 94% 95%  Weight:      Height:        Intake/Output Summary (Last 24 hours) at 07/21/2020 1032 Last data filed at 07/21/2020 0500 Gross per 24 hour  Intake 240 ml  Output 750 ml  Net -510 ml   Filed Weights   07/16/20 0615  Weight: 73.5 kg    Examination:  General exam: Appears calm and comfortable  Respiratory system: Clear to auscultation. Respiratory effort normal. Cardiovascular system:  S1 & S2 heard, RRR. No JVD, murmurs, rubs, gallops or clicks. No pedal edema. Gastrointestinal system: Abdomen is nondistended, soft and nontender. No organomegaly or masses felt. Normal bowel sounds heard. Central nervous system: Alert and oriented. No focal neurological  deficits. Extremities: Symmetric 5 x 5 power.  Trace pitting edema bilateral lower extremities.  Status post right knee replacement.  Some expected tenderness Skin: No rashes, lesions or ulcers Psychiatry: Judgement and insight appear normal. Mood & affect appropriate.     Data Reviewed: I have personally reviewed following labs and imaging studies  CBC: Recent Labs  Lab 07/18/20 0639 07/20/20 0921  WBC 11.5* 10.8*  HGB 10.4* 10.2*  HCT 29.9* 30.7*  MCV 99.0 100.7*  PLT 149* 604   Basic Metabolic Panel: Recent Labs  Lab 07/16/20 1519 07/16/20 1519 07/17/20 0918 07/18/20 0639 07/19/20 0429 07/20/20 0921 07/21/20 0608  NA  --   --   --   --  136 136  --   K 4.0   < > 3.9 3.8 4.0 3.7 3.5  CL  --   --   --   --  98 96*  --   CO2  --   --   --   --  29 29  --   GLUCOSE  --   --   --   --  139* 133*  --   BUN  --   --   --   --  22 26*  --   CREATININE 0.99   < > 0.95 0.82 0.74 0.73 0.77  CALCIUM  --   --   --   --  8.9 8.9  --   MG 2.0  --  2.1 1.8 2.1  --  1.9   < > = values in this interval not displayed.   GFR: Estimated Creatinine Clearance: 74 mL/min (by C-G formula based on SCr of 0.77 mg/dL). Liver Function Tests: No results for input(s): AST, ALT, ALKPHOS, BILITOT, PROT, ALBUMIN in the last 168 hours. No results for input(s): LIPASE, AMYLASE in the last 168 hours. Recent Labs  Lab 07/20/20 0952  AMMONIA 16   Coagulation Profile: No results for input(s): INR, PROTIME in the last 168 hours. Cardiac Enzymes: No results for input(s): CKTOTAL, CKMB, CKMBINDEX, TROPONINI in the last 168 hours. BNP (last 3 results) No results for input(s): PROBNP in the last 8760 hours. HbA1C: No results for input(s): HGBA1C in the last 72 hours. CBG: No results for input(s): GLUCAP in the last 168 hours. Lipid Profile: No results for input(s): CHOL, HDL, LDLCALC, TRIG, CHOLHDL, LDLDIRECT in the last 72 hours. Thyroid Function Tests: No results for input(s): TSH, T4TOTAL,  FREET4, T3FREE, THYROIDAB in the last 72 hours. Anemia Panel: No results for input(s): VITAMINB12, FOLATE, FERRITIN, TIBC, IRON, RETICCTPCT in the last 72 hours. Sepsis Labs: No results for input(s): PROCALCITON, LATICACIDVEN in the last 168 hours.  Recent Results (from the past 240 hour(s))  SARS CORONAVIRUS 2 (TAT 6-24 HRS) Nasopharyngeal Nasopharyngeal Swab     Status: None   Collection Time: 07/14/20 10:52 AM   Specimen: Nasopharyngeal Swab  Result Value Ref Range Status   SARS Coronavirus 2 NEGATIVE NEGATIVE Final    Comment: (NOTE) SARS-CoV-2 target nucleic acids are NOT DETECTED.  The SARS-CoV-2 RNA is generally detectable in upper and lower respiratory specimens during the acute phase of infection. Negative results do not preclude SARS-CoV-2 infection, do not rule out co-infections with other pathogens, and should not be used as  the sole basis for treatment or other patient management decisions. Negative results must be combined with clinical observations, patient history, and epidemiological information. The expected result is Negative.  Fact Sheet for Patients: SugarRoll.be  Fact Sheet for Healthcare Providers: https://www.woods-mathews.com/  This test is not yet approved or cleared by the [place] FDA and  has been authorized for detection and/or diagnosis of SARS-CoV-2 by FDA under an Emergency Use Authorization (EUA). This EUA will remain  in effect (meaning this test can be used) for the duration of the COVID-19 declaration under Se ction 564(b)(1) of the Act, 21 U.S.C. section 360bbb-3(b)(1), unless the authorization is terminated or revoked sooner.  Performed at [facility], [place] [address] [place], [place] [address]          Radiology Studies: CT HEAD WO CONTRAST  Result Date: 07/20/2020 CLINICAL DATA:  Altered mental status/delirium. Four days status post total knee arthroplasty EXAM: CT HEAD  WITHOUT CONTRAST TECHNIQUE: Contiguous axial images were obtained from the base of the skull through the vertex without intravenous contrast. COMPARISON:  None. FINDINGS: Brain: There is age related volume loss. There is no intracranial mass, hemorrhage, extra-axial fluid collection, or midline shift. There is slight decreased attenuation in the centra semiovale bilaterally, likely representing periventricular small vessel vascular disease. No acute appearing infarct evident. Vascular: No hyperdense vessel. No appreciable vascular calcification. Skull: The bony calvarium appears intact. Sinuses/Orbits: Opacification noted in the inferior right maxillary antrum. Small retention cyst in inferior left maxillary antrum. Mild mucosal thickening in several ethmoid air cells. Orbits appear symmetric bilaterally. Other: Visualized mastoid air cells are clear. IMPRESSION: Mild periventricular small vessel disease. Age related volume loss. No acute infarct evident. No mass or hemorrhage. Foci of paranasal sinus disease noted. Electronically Signed   By: Lowella Grip III M.D.   On: 07/20/2020 10:31        Scheduled Meds: . cycloSPORINE  1 drop Both Eyes BID  . dabigatran  150 mg Oral BID  . Desmopressin Acetate  55.3 mcg Sublingual QHS  . docusate sodium  100 mg Oral BID  . dofetilide  125 mcg Oral Q12H  . ferrous sulfate  325 mg Oral Q breakfast  . folic acid  1 mg Oral Daily  . metroNIDAZOLE  1 application Topical QHS  . multivitamin with minerals  1 tablet Oral Daily  . potassium chloride  10 mEq Oral Daily  . vitamin B-12  1,000 mcg Oral Daily   Continuous Infusions: . magnesium sulfate bolus IVPB       LOS: 5 days    Time spent: 25 minutes    Sidney Ace, MD [facility] Pager [phone]  If 7PM-7AM, please contact night-coverage 07/21/2020, 10:32 AM

## 2020-07-21 NOTE — Progress Notes (Signed)
[facility] Cardiology    SUBJECTIVE: Patient confused agitated delusional delirious denies any pain   Vitals:   07/20/20 2044 07/20/20 2343 07/21/20 0418 07/21/20 0809  BP: (!) 162/96 (!) 176/98 (!) 145/96 (!) 138/93  Pulse: 92 76 100 97  Resp: 18 17 18 17   Temp: 98 F (36.7 C) 98.2 F (36.8 C) 98.1 F (36.7 C) 98 F (36.7 C)  TempSrc: Oral   Oral  SpO2: (!) 88% (!) 78% 94% 95%  Weight:      Height:         Intake/Output Summary (Last 24 hours) at 07/21/2020 1107 Last data filed at 07/21/2020 0500 Gross per 24 hour  Intake 240 ml  Output 750 ml  Net -510 ml      PHYSICAL EXAM  General: Well developed, well nourished, in no acute distress HEENT:  Normocephalic and atramatic Neck:  No JVD.  Lungs: Clear bilaterally to auscultation and percussion. Heart: HRRR . Normal S1 and S2 without gallops or murmurs.  Abdomen: Bowel sounds are positive, abdomen soft and non-tender  Msk:  Back normal, normal gait. Normal strength and tone for age. Extremities: No clubbing, cyanosis or edema.   Neuro: Alert and oriented X 3. Psych:  Good affect, responds appropriately   LABS: Basic Metabolic Panel: Recent Labs    07/19/20 0429 07/19/20 0429 07/20/20 0921 07/21/20 0608  NA 136  --  136  --   K 4.0   < > 3.7 3.5  CL 98  --  96*  --   CO2 29  --  29  --   GLUCOSE 139*  --  133*  --   BUN 22  --  26*  --   CREATININE 0.74   < > 0.73 0.77  CALCIUM 8.9  --  8.9  --   MG 2.1  --   --  1.9   < > = values in this interval not displayed.   Liver Function Tests: No results for input(s): AST, ALT, ALKPHOS, BILITOT, PROT, ALBUMIN in the last 72 hours. No results for input(s): LIPASE, AMYLASE in the last 72 hours. CBC: Recent Labs    07/20/20 0921  WBC 10.8*  HGB 10.2*  HCT 30.7*  MCV 100.7*  PLT 182   Cardiac Enzymes: No results for input(s): CKTOTAL, CKMB, CKMBINDEX, TROPONINI in the last 72 hours. BNP: Invalid input(s): POCBNP D-Dimer: No results for input(s): DDIMER  in the last 72 hours. Hemoglobin A1C: No results for input(s): HGBA1C in the last 72 hours. Fasting Lipid Panel: No results for input(s): CHOL, HDL, LDLCALC, TRIG, CHOLHDL, LDLDIRECT in the last 72 hours. Thyroid Function Tests: No results for input(s): TSH, T4TOTAL, T3FREE, THYROIDAB in the last 72 hours.  Invalid input(s): FREET3 Anemia Panel: No results for input(s): VITAMINB12, FOLATE, FERRITIN, TIBC, IRON, RETICCTPCT in the last 72 hours.  CT HEAD WO CONTRAST  Result Date: 07/20/2020 CLINICAL DATA:  Altered mental status/delirium. Four days status post total knee arthroplasty EXAM: CT HEAD WITHOUT CONTRAST TECHNIQUE: Contiguous axial images were obtained from the base of the skull through the vertex without intravenous contrast. COMPARISON:  None. FINDINGS: Brain: There is age related volume loss. There is no intracranial mass, hemorrhage, extra-axial fluid collection, or midline shift. There is slight decreased attenuation in the centra semiovale bilaterally, likely representing periventricular small vessel vascular disease. No acute appearing infarct evident. Vascular: No hyperdense vessel. No appreciable vascular calcification. Skull: The bony calvarium appears intact. Sinuses/Orbits: Opacification noted in the inferior right maxillary antrum.  Small retention cyst in inferior left maxillary antrum. Mild mucosal thickening in several ethmoid air cells. Orbits appear symmetric bilaterally. Other: Visualized mastoid air cells are clear. IMPRESSION: Mild periventricular small vessel disease. Age related volume loss. No acute infarct evident. No mass or hemorrhage. Foci of paranasal sinus disease noted. Electronically Signed   By: Lowella Grip III M.D.   On: 07/20/2020 10:31       TELEMETRY: Normal sinus rhythm nonspecific ST-T wave changes  ASSESSMENT AND PLAN:  Principal Problem:   Total knee replacement status, right Active Problems:   Paroxysmal atrial fibrillation (HCC)    Leukocytosis   Acute metabolic encephalopathy   Normocytic anemia   S/P TKR (total knee replacement) using cement, right   Plan Resume Tikosyn at previous dose twice a day Continue anticoagulation postoperative atrial fibrillation Agree with orthopedic management post total knee replacement Further evaluation for delirium and delusion possibly narcotic related or sundowning Reevaluate appropriateness for skilled nursing facility transfer   Yolonda Kida, MD 07/21/2020 11:07 AM

## 2020-07-21 NOTE — Consult Note (Addendum)
Brief Pharmacy Note  Pharmacy consulted to review patient's home medication of  Tikosyn. Confirmed with patient no missed doses and last Tikosyn dose was today at ~0530. Per patient, he typically takes the first dose of Tikosyn around 0800. Confirmed with patient that he takes Tikosyn 125 mcg Q12H.   Will resume home dose of Tikosyn 125 mcg Q12H.   Goal K>/=4.0 and Mag >/= 2.0  11/22  K 3.7 (patient is on scheduled KCL PO 10 mEq daily), Scr 0.73. Will order KCL PO 10 mEq x1 dose.  EKG on 11/18 QT/QTc 442/490 ms. EKG on 11/22 410/509 ms. Patient already received morning dose. Will messaged consulted MD, Dr. Blaine Hamper, and cardiology (Dr. Clayborn Bigness) on whether current dose will be continued. Per Dr. Blaine Hamper, hold Tikosyn evening dose and will check EKG tomorrow morning. Additionally switch ondansetron to hydroxyzine (low rate of causing QTc prolongation).  Will continue to monitor a nd replace for goal of K > 4 and Mg > 2. QTc < 500 ms.    11/23:  K 3.5  Mag 1.9  Scr 0.77 Pt on KCL 10 meq po daily -Will order KCL 40 meq po x 1 dose and Magnesium sulfate 1 gm IV x 1 - EKG ordered for this am to eval Qtc  Addendum: QTc= 400 11/23 at 0714. Resume Tikosyn per Cardiology MD note.    Thank you for allowing pharmacy to be a part of this patient's care.  Chinita Greenland PharmD Clinical Pharmacist 07/21/2020

## 2020-07-21 NOTE — Progress Notes (Signed)
Physical Therapy Treatment Patient Details Name: Terry Collins MRN: [id number] DOB: 08-20-1936 Today's Date: 07/21/2020    History of Present Illness Terry Collins is an 34yoM s/p R TKA 11/18. PTA pt lived alone at the [place] over at [facility], used a 4WW to circumnavigate the living environment.    PT Comments    Pt asking to get up to the [facility] for BM as soon as PT entered room.  He showed good effort but needed considerable assistance with all aspects of mobility and transfers.  He was unable to rise from standard height bed w/o significant assist.  He was able to put some weight on the walker to try and take a few shuffle steps to the commode but ultimately need mod/max assist to insure he landed safely.  He did have a large BM before actually making in to the commode and then with further effort on the commode he had additional significant amount of stool.  He was unable to organize trying to clean up while sitting on commode.  We decided to try cleaning up in standing which proved to be impossible secondary to his inability to maintain standing. He was exhausted with the effort and ultimately he needed heavy assist to get back to bed, including heavy assist just to lift LEs into the bed.  Deferred exercises as pt was fatigued and nursing was called about further assisting with clean up.    Follow Up Recommendations  SNF;Supervision for mobility/OOB     Equipment Recommendations  Rolling walker with 5" wheels    Recommendations for Other Services       Precautions / Restrictions Precautions Precautions: Fall;Knee Restrictions RLE Weight Bearing: Weight bearing as tolerated    Mobility  Bed Mobility Overal bed mobility: Needs Assistance Bed Mobility: Supine to Sit;Sit to Supine     Supine to sit: Mod assist Sit to supine: Mod assist      Transfers Overall transfer level: Needs assistance Equipment used: Rolling walker (2 wheeled) Transfers: Sit to/from Stand Sit to  Stand: Mod assist         General transfer comment: Pt showed good effort with getting to standing, R KI donned and struggled to get all his weight over the L enough to rise w/o assist.    Ambulation/Gait             General Gait Details: Pt struggled with static standing and ultimately could not take even a small purposeful step   Stairs             Wheelchair Mobility    Modified Rankin (Stroke Patients Only)       Balance Overall balance assessment: Needs assistance Sitting-balance support: No upper extremity supported;Feet supported Sitting balance-Leahy Scale: Fair Sitting balance - Comments: pt with hunched, but safe posture on commode.  Struggled to shift weight                                     Cognition Arousal/Alertness: Awake/alert Behavior During Therapy: Restless;WFL for tasks assessed/performed Overall Cognitive Status: Within Functional Limits for tasks assessed                                        Exercises      General Comments General comments (skin integrity, edema, etc.): Pt foremost and primary  concern is trying to have a BM (apparently unsuccessful on multiple bed pan attempts this AM).        Pertinent Vitals/Pain Pain Assessment: 0-10 Pain Score: 4  Pain Location: Rt knee    Home Living                      Prior Function            PT Goals (current goals can now be found in the care plan section) Progress towards PT goals: Progressing toward goals    Frequency    BID      PT Plan Current plan remains appropriate    Co-evaluation              AM-PAC PT "6 Clicks" Mobility   Outcome Measure  Help needed turning from your back to your side while in a flat bed without using bedrails?: A Lot Help needed moving from lying on your back to sitting on the side of a flat bed without using bedrails?: A Lot Help needed moving to and from a bed to a chair (including a  wheelchair)?: Total Help needed standing up from a chair using your arms (e.g., wheelchair or bedside chair)?: Total Help needed to walk in hospital room?: Total Help needed climbing 3-5 steps with a railing? : Total 6 Click Score: 8    End of Session Equipment Utilized During Treatment: Gait belt Activity Tolerance: Patient tolerated treatment well;No increased pain;Patient limited by lethargy Patient left: with call bell/phone within reach;with chair alarm set Nurse Communication: Mobility status PT Visit Diagnosis: Unsteadiness on feet (R26.81);Difficulty in walking, not elsewhere classified (R26.2);Muscle weakness (generalized) (M62.81)     Time: 0277-4128 PT Time Calculation (min) (ACUTE ONLY): 29 min  Charges:  $Therapeutic Activity: 23-37 mins                     Kreg Shropshire, DPT 07/21/2020, 1:28 PM

## 2020-07-21 NOTE — Progress Notes (Signed)
  Subjective:  POD #5 s/p right total knee arthroplasty.   Patient reports right knee pain as mild.  The patient is more oriented today.  He is drowsy but arousable.  Patient has been cleared medically for discharge.  No further testing is necessary.  Objective:   VITALS:   Vitals:   07/20/20 2343 07/21/20 0418 07/21/20 0809 07/21/20 1135  BP: (!) 176/98 (!) 145/96 (!) 138/93 124/75  Pulse: 76 100 97 80  Resp: 17 18 17 17   Temp: 98.2 F (36.8 C) 98.1 F (36.7 C) 98 F (36.7 C) 98.3 F (36.8 C)  TempSrc:   Oral   SpO2: (!) 78% 94% 95% 92%  Weight:      Height:        PHYSICAL EXAM: Right lower extremity: Neurovascular intact Sensation intact distally Intact pulses distally Dorsiflexion/Plantar flexion intact Incision: scant drainage No cellulitis present Compartment soft  LABS  Results for orders placed or performed during the hospital encounter of 07/16/20 (from the past 24 hour(s))  Magnesium     Status: None   Collection Time: 07/21/20  6:08 AM  Result Value Ref Range   Magnesium 1.9 1.7 - 2.4 mg/dL  Potassium     Status: None   Collection Time: 07/21/20  6:08 AM  Result Value Ref Range   Potassium 3.5 3.5 - 5.1 mmol/L  Creatinine, serum     Status: None   Collection Time: 07/21/20  6:08 AM  Result Value Ref Range   Creatinine, Ser 0.77 0.61 - 1.24 mg/dL   GFR, Estimated >60 >60 mL/min    CT HEAD WO CONTRAST  Result Date: 07/20/2020 CLINICAL DATA:  Altered mental status/delirium. Four days status post total knee arthroplasty EXAM: CT HEAD WITHOUT CONTRAST TECHNIQUE: Contiguous axial images were obtained from the base of the skull through the vertex without intravenous contrast. COMPARISON:  None. FINDINGS: Brain: There is age related volume loss. There is no intracranial mass, hemorrhage, extra-axial fluid collection, or midline shift. There is slight decreased attenuation in the centra semiovale bilaterally, likely representing periventricular small vessel  vascular disease. No acute appearing infarct evident. Vascular: No hyperdense vessel. No appreciable vascular calcification. Skull: The bony calvarium appears intact. Sinuses/Orbits: Opacification noted in the inferior right maxillary antrum. Small retention cyst in inferior left maxillary antrum. Mild mucosal thickening in several ethmoid air cells. Orbits appear symmetric bilaterally. Other: Visualized mastoid air cells are clear. IMPRESSION: Mild periventricular small vessel disease. Age related volume loss. No acute infarct evident. No mass or hemorrhage. Foci of paranasal sinus disease noted. Electronically Signed   By: Lowella Grip III M.D.   On: 07/20/2020 10:31    Assessment/Plan: 5 Days Post-Op   Principal Problem:   Total knee replacement status, right Active Problems:   Paroxysmal atrial fibrillation (HCC)   Leukocytosis   Acute metabolic encephalopathy   Normocytic anemia   S/P TKR (total knee replacement) using cement, right  Patient is improved after acute onset of postop delirium on Sunday night.  Patient is stable and ready for discharge to rehab.  Patient has made progress with physical therapy.  Patient is on Pradaxa which will cover him for DVT prophylaxis.    Thornton Park , MD 07/21/2020, 11:52 AM

## 2020-07-21 NOTE — Plan of Care (Signed)
  Problem: Coping: Goal: Level of anxiety will decrease 07/21/2020 1230 by Shearon Balo, RN Outcome: Adequate for Discharge 07/21/2020 1229 by Shearon Balo, RN Outcome: Adequate for Discharge   Problem: Elimination: Goal: Will not experience complications related to bowel motility 07/21/2020 1230 by Shearon Balo, RN Outcome: Adequate for Discharge 07/21/2020 1229 by Shearon Balo, RN Outcome: Adequate for Discharge Goal: Will not experience complications related to urinary retention 07/21/2020 1230 by Shearon Balo, RN Outcome: Adequate for Discharge 07/21/2020 1229 by Shearon Balo, RN Outcome: Adequate for Discharge   Problem: Pain Managment: Goal: General experience of comfort will improve 07/21/2020 1230 by Shearon Balo, RN Outcome: Adequate for Discharge 07/21/2020 1229 by Shearon Balo, RN Outcome: Adequate for Discharge   Problem: Safety: Goal: Ability to remain free from injury will improve 07/21/2020 1230 by Shearon Balo, RN Outcome: Adequate for Discharge 07/21/2020 1229 by Shearon Balo, RN Outcome: Adequate for Discharge   Problem: Skin Integrity: Goal: Risk for impaired skin integrity will decrease 07/21/2020 1230 by Shearon Balo, RN Outcome: Adequate for Discharge 07/21/2020 1229 by Shearon Balo, RN Outcome: Adequate for Discharge

## 2020-07-21 NOTE — Progress Notes (Signed)
Physical Therapy Treatment Patient Details Name: Terry Collins MRN: [id number] DOB: 04/28/1936 Today's Date: 07/21/2020    History of Present Illness Terry Collins is an 89yoM s/p R TKA 11/18. PTA pt lived alone at the [place] at [facility], used a 4WW to circumnavigate the living environment.    PT Comments    Pt continues to have some confusion and struggled with some exercises, though he did show good apparent effort with most tasks.  He was able to participate with supine exercises, but struggled with AROM on R and could not manage consistent quad engagement.  Pt asking some confused questions, and needed consistent cuing to perform even modest exercises.  Knee remains quite stiff, c/o pain with PROM flexion >60, has ~3-77 degrees available. Pt with more pain this afternoon, he is looking forward to working more with PT at rehab.   Follow Up Recommendations  SNF;Supervision for mobility/OOB     Equipment Recommendations  Rolling walker with 5" wheels    Recommendations for Other Services       Precautions / Restrictions Precautions Precautions: Fall;Knee Required Braces or Orthoses: Knee Immobilizer - Right Restrictions RLE Weight Bearing: Weight bearing as tolerated    Mobility  Bed Mobility Overal bed mobility: Needs Assistance Bed Mobility: Supine to Sit;Sit to Supine     Supine to sit: Mod assist Sit to supine: Mod assist   General bed mobility comments: deferred, pt with some confusion, leaving for rehab soon, c/o pain  Transfers Overall transfer level: Needs assistance Equipment used: Rolling walker (2 wheeled) Transfers: Sit to/from Stand Sit to Stand: Mod assist         General transfer comment: Pt showed good effort with getting to standing, R KI donned and struggled to get all his weight over the L enough to rise w/o assist.    Ambulation/Gait             General Gait Details: Pt struggled with static standing and ultimately could not take  even a small purposeful step   Stairs             Wheelchair Mobility    Modified Rankin (Stroke Patients Only)       Balance Overall balance assessment: Needs assistance Sitting-balance support: No upper extremity supported;Feet supported Sitting balance-Leahy Scale: Fair Sitting balance - Comments: pt with hunched, but safe posture on commode.  Struggled to shift weight                                     Cognition Arousal/Alertness: Lethargic Behavior During Therapy: Restless;WFL for tasks assessed/performed Overall Cognitive Status: Within Functional Limits for tasks assessed                                        Exercises Total Joint Exercises Ankle Circles/Pumps: AROM;10 reps Quad Sets: AROM;10 reps (pt struggles to consistently set quad) Short Arc Quad: AAROM;10 reps (could not attain TKE on his own) Heel Slides: AAROM;5 reps Hip ABduction/ADduction: AAROM;10 reps (pt unable to move LE w/o at least some AAROM) Straight Leg Raises: PROM;5 reps Knee Flexion: PROM;5 reps Goniometric ROM: 3-77    General Comments General comments (skin integrity, edema, etc.): Pt foremost and primary concern is trying to have a BM (apparently unsuccessful on multiple bed pan attempts this AM).  Pertinent Vitals/Pain Pain Assessment: 0-10 Pain Score: 4  Pain Location: Rt knee    Home Living                      Prior Function            PT Goals (current goals can now be found in the care plan section) Progress towards PT goals: Progressing toward goals (slow progress)    Frequency    BID      PT Plan Current plan remains appropriate    Co-evaluation              AM-PAC PT "6 Clicks" Mobility   Outcome Measure  Help needed turning from your back to your side while in a flat bed without using bedrails?: A Lot Help needed moving from lying on your back to sitting on the side of a flat bed without using  bedrails?: A Lot Help needed moving to and from a bed to a chair (including a wheelchair)?: Total Help needed standing up from a chair using your arms (e.g., wheelchair or bedside chair)?: Total Help needed to walk in hospital room?: Total Help needed climbing 3-5 steps with a railing? : Total 6 Click Score: 8    End of Session Equipment Utilized During Treatment: Gait belt Activity Tolerance: Patient tolerated treatment well;No increased pain;Patient limited by lethargy Patient left: with call bell/phone within reach;with chair alarm set Nurse Communication: Mobility status PT Visit Diagnosis: Unsteadiness on feet (R26.81);Difficulty in walking, not elsewhere classified (R26.2);Muscle weakness (generalized) (M62.81)     Time: 2841-3244 PT Time Calculation (min) (ACUTE ONLY): 25 min  Charges:  $Therapeutic Exercise: 23-37 mins $Therapeutic Activity: 23-37 mins                     Kreg Shropshire, DPT 07/21/2020, 3:49 PM

## 2020-07-21 NOTE — Progress Notes (Signed)
Please resume Tikosyn at previous dose. Thank you Catheryn Slifer

## 2020-07-21 NOTE — Discharge Summary (Signed)
Physician Discharge Summary  Patient ID: Terry Collins MRN: [id number] DOB/AGE: 82/07/1936 82 y.o.  Admit date: 07/16/2020 Discharge date: 07/21/2020  Admission Diagnoses:  Right Knee Osteoarthritis Total knee replacement status, right  Discharge Diagnoses:  Right Knee Osteoarthritis Principal Problem:   Total knee replacement status, right Active Problems:   Paroxysmal atrial fibrillation (HCC)   Leukocytosis   Acute metabolic encephalopathy   Normocytic anemia   S/P TKR (total knee replacement) using cement, right   Past Medical History:  Diagnosis Date  . Anemia   . Aortic valve insufficiency   . Arthritis    hands  . Atherosclerosis of aorta ([place])   . Cancer ([place])    skin precancerous areas have been removed  . Carotid stenosis   . History of kidney stones 2010  . Mixed hyperlipidemia   . Paroxysmal atrial fibrillation (HCC)   . Venous insufficiency of both lower extremities     Surgeries: Procedure(s): RIGHT TOTAL KNEE ARTHROPLASTY on 07/16/2020   Consultants (if any): Treatment Team:  Alla Feeling, [facility] Team 3, MD  Discharged Condition: Improved  Hospital Course: Terry Collins is an 84 y.o. male who was admitted 07/16/2020 with a diagnosis of  Right Knee Osteoarthritis Total knee replacement status, right and went to the operating room on 07/16/2020 and underwent an uncomplicated right total knee arthroplasty.    He was given perioperative antibiotics:  Anti-infectives (From admission, onward)   Start     Dose/Rate Route Frequency Ordered Stop   07/16/20 1400  ceFAZolin (ANCEF) IVPB 1 g/50 mL premix        1 g 100 mL/hr over 30 Minutes Intravenous Every 6 hours 07/16/20 1304 07/16/20 2134   07/16/20 0600  ceFAZolin (ANCEF) IVPB 2g/100 mL premix        2 g 200 mL/hr over 30 Minutes Intravenous On call to O.R. 07/15/20 2228 07/16/20 0827   07/15/20 2230  clindamycin (CLEOCIN) IVPB 600 mg        600 mg 100 mL/hr over 30 Minutes Intravenous  Once 07/15/20  2228 07/16/20 0839    .  He was given sequential compression devices, early ambulation, and pradaxa for DVT prophylaxis.  Patient did well until postop day #4 when he had the acute onset of postop confusion and delirium.  It was felt that this was due to pain medication and tramadol was discontinued.  A CT of the head was negative.  Lab work revealed no significant abnormalities.  Patient was then cleared medicine for discharge on postop day #5.  Patient made progress of physical therapy and is stable from an orthopedic standpoint.  He benefited maximally from the hospital stay and there were no complications.    Recent vital signs:  Vitals:   07/21/20 0809 07/21/20 1135  BP: (!) 138/93 124/75  Pulse: 97 80  Resp: 17 17  Temp: 98 F (36.7 C) 98.3 F (36.8 C)  SpO2: 95% 92%    Recent laboratory studies:  Lab Results  Component Value Date   HGB 10.2 (L) 07/20/2020   HGB 10.4 (L) 07/18/2020   Lab Results  Component Value Date   WBC 10.8 (H) 07/20/2020   PLT 182 07/20/2020   Lab Results  Component Value Date   INR 1.3 (H) 07/07/2020   Lab Results  Component Value Date   NA 136 07/20/2020   K 3.5 07/21/2020   CL 96 (L) 07/20/2020   CO2 29 07/20/2020   BUN 26 (H) 07/20/2020   CREATININE 0.77 07/21/2020  GLUCOSE 133 (H) 07/20/2020    Discharge Medications:   Allergies as of 07/21/2020   No Known Allergies     Medication List    TAKE these medications   cycloSPORINE 0.05 % ophthalmic emulsion Commonly known as: RESTASIS Place 1 drop into both eyes 2 (two) times daily.   dabigatran 150 MG Caps capsule Commonly known as: PRADAXA Take 150 mg by mouth 2 (two) times daily.   docusate sodium 100 MG capsule Commonly known as: COLACE Take 1 capsule (100 mg total) by mouth 2 (two) times daily.   dofetilide 125 MCG capsule Commonly known as: TIKOSYN Take 125 mcg by mouth in the morning and at bedtime.   ferrous sulfate 325 (65 FE) MG tablet Take 325 mg by mouth  daily with breakfast.   folic acid 782 MCG tablet Commonly known as: FOLVITE Take 800 mcg by mouth daily.   HYDROcodone-acetaminophen 5-325 MG tablet Commonly known as: NORCO/VICODIN Take 1 tablet by mouth every 6 (six) hours as needed for moderate pain (pain score 4-6).   Klor-Con Sprinkle 10 MEQ CR capsule Generic drug: potassium chloride Take 10 mEq by mouth daily.   metroNIDAZOLE 1 % gel Commonly known as: METROGEL Apply 1 application topically at bedtime.   multivitamin with minerals Tabs tablet Take 1 tablet by mouth daily.   Nocdurna 55.3 MCG Subl Generic drug: Desmopressin Acetate Place 55.3 mcg under the tongue at bedtime.   vitamin B-12 1000 MCG tablet Commonly known as: CYANOCOBALAMIN Take 1,000 mcg by mouth daily.       Diagnostic Studies: Chest 2 View  Result Date: 07/08/2020 CLINICAL DATA:  Preop evaluation for total knee replacement EXAM: CHEST - 2 VIEW COMPARISON:  None available FINDINGS: Mild hyperinflation noted with bilateral apical pleuroparenchymal scarring. Background COPD suspected. No superimposed acute airspace process, collapse or consolidation. Negative for edema, effusion or pneumothorax. Trachea midline. Mild cardiomegaly. Aorta is atherosclerotic and tortuous. No acute osseous finding. 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 Cardiomegaly Aortic Atherosclerosis (ICD10-I70.0). Electronically Signed   By: Jerilynn Mages.  Shick M.D.   On: 07/08/2020 10:58   CT HEAD WO CONTRAST  Result Date: 07/20/2020 CLINICAL DATA:  Altered mental status/delirium. Four days status post total knee arthroplasty EXAM: CT HEAD WITHOUT CONTRAST TECHNIQUE: Contiguous axial images were obtained from the base of the skull through the vertex without intravenous contrast. COMPARISON:  None. FINDINGS: Brain: There is age related volume loss. There is no intracranial mass, hemorrhage, extra-axial fluid collection, or midline shift. There is  slight decreased attenuation in the centra semiovale bilaterally, likely representing periventricular small vessel vascular disease. No acute appearing infarct evident. Vascular: No hyperdense vessel. No appreciable vascular calcification. Skull: The bony calvarium appears intact. Sinuses/Orbits: Opacification noted in the inferior right maxillary antrum. Small retention cyst in inferior left maxillary antrum. Mild mucosal thickening in several ethmoid air cells. Orbits appear symmetric bilaterally. Other: Visualized mastoid air cells are clear. IMPRESSION: Mild periventricular small vessel disease. Age related volume loss. No acute infarct evident. No mass or hemorrhage. Foci of paranasal sinus disease noted. Electronically Signed   By: Lowella Grip III M.D.   On: 07/20/2020 10:31   DG Knee Right Port  Result Date: 07/16/2020 CLINICAL DATA:  Postop for knee arthroplasty. EXAM: PORTABLE RIGHT KNEE - 1-2 VIEW COMPARISON:  None. FINDINGS: Right knee arthroplasty, without fracture or acute hardware complication. Expected fluid and gas within the joint. Overlying surgical staples. IMPRESSION: Expected appearance after right knee arthroplasty. Electronically Signed   By: Marylyn Ishihara  Jobe Igo M.D.   On: 07/16/2020 12:02    Disposition: Discharge disposition: 03-Skilled Nursing Facility       Discharge Instructions    Call MD / Call 911   Complete by: As directed    If you experience chest pain or shortness of breath, CALL 911 and be transported to the hospital emergency room.  If you develope a fever above 101 F, pus (white drainage) or increased drainage or redness at the wound, or calf pain, call your surgeon's office.   Constipation Prevention   Complete by: As directed    Drink plenty of fluids.  Prune juice may be helpful.  You may use a stool softener, such as Colace (over the counter) 100 mg twice a day.  Use MiraLax (over the counter) for constipation as needed.   Diet - low sodium heart healthy    Complete by: As directed    Discharge instructions   Complete by: As directed    The patient may continue to bear weight on the right lower extremity with use of a walker. The patient should continue to use TED stockings until follow-up. Patient should remove the TED stockings at night for sleep. The patient needs to continue to elevate the right lower extremity whenever possible. The knee immobilizer should be used at night. The patient may remove the knee immobilizer to perform exercises or sit in a chair during the day.  Patient should not place a pillow under their knee. The Polar Care may be used by the patient for comfort.  The dressing should remain on until follow up in the office.   The patient must cover the right knee dressing/incision during showers with a plastic bag or Saran wrap.  The patient will take his baseline pradaxa for DVT prophylaxis and continue to work on knee range of motion exercises at home as instructed by physical therapy until follow-up in the office.   Driving restrictions   Complete by: As directed    No driving for 3-36 weeks   Increase activity slowly as tolerated   Complete by: As directed    Lifting restrictions   Complete by: As directed    No lifting for 12-16 weeks         Signed: [place] ,MD 07/21/2020, 12:01 PM

## 2020-07-21 NOTE — Plan of Care (Signed)
Problem: Education: Goal: Knowledge of the prescribed therapeutic regimen will improve 07/21/2020 1235 by Shearon Balo, RN Outcome: Adequate for Discharge 07/21/2020 1234 by Shearon Balo, RN Outcome: Adequate for Discharge 07/21/2020 1230 by Shearon Balo, RN Outcome: Adequate for Discharge 07/21/2020 1229 by Shearon Balo, RN Outcome: Adequate for Discharge Goal: Individualized Educational Video(s) 07/21/2020 1235 by Shearon Balo, RN Outcome: Adequate for Discharge 07/21/2020 1234 by Shearon Balo, RN Outcome: Adequate for Discharge 07/21/2020 1230 by Shearon Balo, RN Outcome: Adequate for Discharge 07/21/2020 1229 by Shearon Balo, RN Outcome: Adequate for Discharge   Problem: Activity: Goal: Ability to avoid complications of mobility impairment will improve 07/21/2020 1235 by Shearon Balo, RN Outcome: Adequate for Discharge 07/21/2020 1234 by Shearon Balo, RN Outcome: Adequate for Discharge 07/21/2020 1230 by Shearon Balo, RN Outcome: Adequate for Discharge 07/21/2020 1229 by Shearon Balo, RN Outcome: Adequate for Discharge Goal: Range of joint motion will improve 07/21/2020 1235 by Shearon Balo, RN Outcome: Adequate for Discharge 07/21/2020 1234 by Shearon Balo, RN Outcome: Adequate for Discharge 07/21/2020 1230 by Shearon Balo, RN Outcome: Adequate for Discharge 07/21/2020 1229 by Shearon Balo, RN Outcome: Adequate for Discharge   Problem: Clinical Measurements: Goal: Postoperative complications will be avoided or minimized 07/21/2020 1235 by Shearon Balo, RN Outcome: Adequate for Discharge 07/21/2020 1234 by Shearon Balo, RN Outcome: Adequate for Discharge 07/21/2020 1230 by Shearon Balo, RN Outcome: Adequate for Discharge 07/21/2020 1229 by Shearon Balo, RN Outcome: Adequate for Discharge   Problem: Pain Management: Goal: Pain level will decrease with appropriate interventions 07/21/2020 1235 by  Shearon Balo, RN Outcome: Adequate for Discharge 07/21/2020 1234 by Shearon Balo, RN Outcome: Adequate for Discharge 07/21/2020 1230 by Shearon Balo, RN Outcome: Adequate for Discharge 07/21/2020 1229 by Shearon Balo, RN Outcome: Adequate for Discharge   Problem: Skin Integrity: Goal: Will show signs of wound healing 07/21/2020 1235 by Shearon Balo, RN Outcome: Adequate for Discharge 07/21/2020 1234 by Shearon Balo, RN Outcome: Adequate for Discharge 07/21/2020 1230 by Shearon Balo, RN Outcome: Adequate for Discharge 07/21/2020 1229 by Shearon Balo, RN Outcome: Adequate for Discharge   Problem: Education: Goal: Knowledge of General Education information will improve Description: Including pain rating scale, medication(s)/side effects and non-pharmacologic comfort measures 07/21/2020 1235 by Shearon Balo, RN Outcome: Adequate for Discharge 07/21/2020 1234 by Shearon Balo, RN Outcome: Adequate for Discharge 07/21/2020 1230 by Shearon Balo, RN Outcome: Adequate for Discharge 07/21/2020 1229 by Shearon Balo, RN Outcome: Adequate for Discharge   Problem: Health Behavior/Discharge Planning: Goal: Ability to manage health-related needs will improve 07/21/2020 1235 by Shearon Balo, RN Outcome: Adequate for Discharge 07/21/2020 1234 by Shearon Balo, RN Outcome: Adequate for Discharge 07/21/2020 1230 by Shearon Balo, RN Outcome: Adequate for Discharge 07/21/2020 1229 by Shearon Balo, RN Outcome: Adequate for Discharge   Problem: Clinical Measurements: Goal: Ability to maintain clinical measurements within normal limits will improve 07/21/2020 1235 by Shearon Balo, RN Outcome: Adequate for Discharge 07/21/2020 1234 by Shearon Balo, RN Outcome: Adequate for Discharge 07/21/2020 1230 by Shearon Balo, RN Outcome: Adequate for Discharge 07/21/2020 1229 by Shearon Balo, RN Outcome: Adequate for Discharge Goal: Will  remain free from infection 07/21/2020 1235 by Shearon Balo, RN Outcome: Adequate for Discharge 07/21/2020 1234 by Shearon Balo, RN Outcome: Adequate for Discharge 07/21/2020 1230 by  Shearon Balo, RN Outcome: Adequate for Discharge 07/21/2020 1229 by Shearon Balo, RN Outcome: Adequate for Discharge Goal: Diagnostic test results will improve 07/21/2020 1235 by Shearon Balo, RN Outcome: Adequate for Discharge 07/21/2020 1234 by Shearon Balo, RN Outcome: Adequate for Discharge 07/21/2020 1230 by Shearon Balo, RN Outcome: Adequate for Discharge 07/21/2020 1229 by Shearon Balo, RN Outcome: Adequate for Discharge Goal: Respiratory complications will improve 07/21/2020 1235 by Shearon Balo, RN Outcome: Adequate for Discharge 07/21/2020 1234 by Shearon Balo, RN Outcome: Adequate for Discharge 07/21/2020 1230 by Shearon Balo, RN Outcome: Adequate for Discharge 07/21/2020 1229 by Shearon Balo, RN Outcome: Adequate for Discharge Goal: Cardiovascular complication will be avoided 07/21/2020 1235 by Shearon Balo, RN Outcome: Adequate for Discharge 07/21/2020 1234 by Shearon Balo, RN Outcome: Adequate for Discharge 07/21/2020 1230 by Shearon Balo, RN Outcome: Adequate for Discharge 07/21/2020 1229 by Shearon Balo, RN Outcome: Adequate for Discharge   Problem: Activity: Goal: Risk for activity intolerance will decrease 07/21/2020 1235 by Shearon Balo, RN Outcome: Adequate for Discharge 07/21/2020 1234 by Shearon Balo, RN Outcome: Adequate for Discharge 07/21/2020 1230 by Shearon Balo, RN Outcome: Adequate for Discharge 07/21/2020 1229 by Shearon Balo, RN Outcome: Adequate for Discharge   Problem: Nutrition: Goal: Adequate nutrition will be maintained 07/21/2020 1235 by Shearon Balo, RN Outcome: Adequate for Discharge 07/21/2020 1234 by Shearon Balo, RN Outcome: Adequate for Discharge 07/21/2020 1230 by Shearon Balo, RN Outcome: Adequate for Discharge 07/21/2020 1229 by Shearon Balo, RN Outcome: Adequate for Discharge   Problem: Coping: Goal: Level of anxiety will decrease 07/21/2020 1235 by Shearon Balo, RN Outcome: Adequate for Discharge 07/21/2020 1234 by Shearon Balo, RN Outcome: Adequate for Discharge 07/21/2020 1230 by Shearon Balo, RN Outcome: Adequate for Discharge 07/21/2020 1229 by Shearon Balo, RN Outcome: Adequate for Discharge   Problem: Elimination: Goal: Will not experience complications related to bowel motility 07/21/2020 1235 by Shearon Balo, RN Outcome: Adequate for Discharge 07/21/2020 1234 by Shearon Balo, RN Outcome: Adequate for Discharge 07/21/2020 1230 by Shearon Balo, RN Outcome: Adequate for Discharge 07/21/2020 1229 by Shearon Balo, RN Outcome: Adequate for Discharge Goal: Will not experience complications related to urinary retention 07/21/2020 1235 by Shearon Balo, RN Outcome: Adequate for Discharge 07/21/2020 1234 by Shearon Balo, RN Outcome: Adequate for Discharge 07/21/2020 1230 by Shearon Balo, RN Outcome: Adequate for Discharge 07/21/2020 1229 by Shearon Balo, RN Outcome: Adequate for Discharge   Problem: Pain Managment: Goal: General experience of comfort will improve 07/21/2020 1235 by Shearon Balo, RN Outcome: Adequate for Discharge 07/21/2020 1234 by Shearon Balo, RN Outcome: Adequate for Discharge 07/21/2020 1230 by Shearon Balo, RN Outcome: Adequate for Discharge 07/21/2020 1229 by Shearon Balo, RN Outcome: Adequate for Discharge   Problem: Safety: Goal: Ability to remain free from injury will improve 07/21/2020 1235 by Shearon Balo, RN Outcome: Adequate for Discharge 07/21/2020 1234 by Shearon Balo, RN Outcome: Adequate for Discharge 07/21/2020 1230 by Shearon Balo, RN Outcome: Adequate for Discharge 07/21/2020 1229 by Shearon Balo, RN Outcome: Adequate for  Discharge   Problem: Skin Integrity: Goal: Risk for impaired skin integrity will decrease 07/21/2020 1235 by Shearon Balo, RN Outcome: Adequate for Discharge 07/21/2020 1234 by Shearon Balo, RN Outcome: Adequate for Discharge 07/21/2020 1230 by Shearon Balo, RN Outcome: Adequate  for Discharge 07/21/2020 1229 by Shearon Balo, RN Outcome: Adequate for Discharge

## 2020-12-10 ENCOUNTER — Ambulatory Visit: Payer: Self-pay | Admitting: General Surgery

## 2020-12-11 ENCOUNTER — Ambulatory Visit: Payer: Self-pay | Admitting: General Surgery

## 2020-12-11 NOTE — H&P (Signed)
HISTORY OF PRESENT ILLNESS:    [name] is a 85 y.o.male patient who comes for evaluation of suspected recurrent right inguinal hernia.  Patient with history of right open inguinal hernia repair with mesh on October 2017 by Dr. Tamala Julian.  Patient came back with recurrence of the right inguinal hernia and new left inguinal hernia.  He underwent robotic assisted bilateral inguinal hernia repair with repair of the recurrent right inguinal hernia and no recurrent left inguinal hernia.  The patient comes again to the office with a large bulge in the right groin.  The bulge is reducible.  He reports pain localized to the bulge.  The pain is aggravated by pressure and certain abdominal wall movement.  Alleviating factor is resting.  Patient denies abdominal distention nausea or vomiting.  Patient denies any chest pain or shortness of breath.  Patient denies any palpitations.  Patient reported that he is currently on treatment for atrial fibrillation without any exacerbation.      PAST MEDICAL HISTORY:  Past Medical History:  Diagnosis Date  . Arrhythmia   . Atrial fibrillation (CMS-HCC)   . Bilateral carotid artery stenosis   . Bilateral carotid artery stenosis   . Cardiomyopathy, secondary (CMS-HCC)   . Chronic anemia, unspecified   . Hyperlipidemia   . Hypertension   . Major depression, melancholic type 94/85/4627  . Megaloblastic anemia   . Paroxysmal atrial fibrillation (CMS-HCC)   . Primary osteoarthritis of right knee 04/30/2020  . Rosacea         PAST SURGICAL HISTORY:   Past Surgical History:  Procedure Laterality Date  . cardiac ablation    . CARDIAC CATHETERIZATION  Oct 11, 2017  . CATARACT EXTRACTION Bilateral 2012  . HERNIA REPAIR  2008  . INGUINAL HERNIA REPAIR Right 06/24/2016   Dr Rochel Brome  . INGUINAL HERNIA REPAIR Bilateral 01/20/2020   Dr Lesli Albee  -- ROBOTIC         MEDICATIONS:  Outpatient Encounter Medications as of 12/10/2020  Medication Sig Dispense  Refill  . cyanocobalamin (VITAMIN B12) 1000 MCG tablet Take 1,000 mcg by mouth once daily       . cycloSPORINE (RESTASIS) 0.05 % ophthalmic emulsion Place 1 drop into both eyes 2 (two) times daily.    Marland Kitchen dofetilide (TIKOSYN) 125 MCG capsule TAKE 1 CAPSULE EVERY 12 HOURS 180 capsule 3  . ferrous sulfate 325 (65 FE) MG tablet Take 325 mg by mouth daily with breakfast    . folic acid (FOLVITE) 1 MG tablet Take 1 mg by mouth once daily    . metroNIDAZOLE (METROGEL) 1 % gel Apply topically once daily    . multivitamin capsule Take 1 capsule by mouth daily.    Marland Kitchen NOCDURNA, MEN, 55.3 mcg TbDL Take 55.3 mcg by mouth nightly    . potassium chloride (MICRO-K) 10 MEQ ER capsule TAKE 1 CAPSULE DAILY 90 capsule 3  . PRADAXA 150 mg capsule TAKE 1 CAPSULE TWICE A DAY 180 capsule 1   No facility-administered encounter medications on file as of 12/10/2020.     ALLERGIES:   Tramadol   SOCIAL HISTORY:  Social History   Socioeconomic History  . Marital status: Widowed  . Number of children: 2  . Years of education: 20  Occupational History  . Occupation: Retired Leisure centre manager  . Smoking status: Never Smoker  . Smokeless tobacco: Never Used  Vaping Use  . Vaping Use: Never used  Substance and Sexual Activity  . Alcohol  use: Yes    Alcohol/week: 2.0 standard drinks    Types: 2 Glasses of wine per week  . Drug use: No  . Sexual activity: Not Currently    Partners: Female    Birth control/protection: None    Comment: I am a widower    FAMILY HISTORY:  Family History  Problem Relation Age of Onset  . Myocardial Infarction (Heart attack) Mother   . Breast cancer Mother   . Stroke Mother   . Cancer Father   . Heart disease Father   . Myocardial Infarction (Heart attack) Father   . Skin cancer Brother   . Atrial fibrillation (Abnormal heart rhythm sometimes requiring treatment with blood thinners) Son      GENERAL REVIEW OF SYSTEMS:   General ROS: negative for - chills, fatigue,  fever, weight gain or weight loss Allergy and Immunology ROS: negative for - hives  Hematological and Lymphatic ROS: negative for - bleeding problems or bruising, negative for palpable nodes Endocrine ROS: negative for - heat or cold intolerance, hair changes Respiratory ROS: negative for - cough, shortness of breath or wheezing Cardiovascular ROS: no chest pain or palpitations GI ROS: negative for nausea, vomiting, abdominal pain, diarrhea, constipation Musculoskeletal ROS: negative for - joint swelling or muscle pain Neurological ROS: negative for - confusion, syncope Dermatological ROS: negative for pruritus and rash  PHYSICAL EXAM:  Vitals:   12/10/20 1411  BP: 113/74  Pulse: 74  .  Ht:188 cm (6\' 2" ) Wt:71.7 kg (158 lb) VFI:EPPI surface area is 1.93 meters squared. Body mass index is 20.29 kg/m.Marland Kitchen   GENERAL: Alert, active, oriented x3  HEENT: Pupils equal reactive to light. Extraocular movements are intact. Sclera clear. Palpebral conjunctiva normal red color.Pharynx clear.  NECK: Supple with no palpable mass and no adenopathy.  LUNGS: Sound clear with no rales rhonchi or wheezes.  HEART: Regular rhythm S1 and S2 without murmur.  ABDOMEN: Soft and depressible, nontender with no palpable mass, no hepatomegaly.  Large right groin bulge, reducible.  EXTREMITIES: Well-developed well-nourished symmetrical with no dependent edema.  NEUROLOGICAL: Awake alert oriented, facial expression symmetrical, moving all extremities.      IMPRESSION:     Unilateral recurrent inguinal hernia without obstruction or gangrene [K40.91] -Patient with recurrence of the right inguinal hernia x2.  The initial surgery was done with open approach.  The second surgery was done robotically assisted laparoscopic hernia repair of the right recurrent inguinal hernia.  I discussed with the patient that due to the size and the symptoms I would recommend to proceed with surgical repair.  Due to the fact that  this has recurred twice and he has surgery in the anterior and posterior planes I discussed with him that this will be a more difficult surgery and both approach might need to be used to be able to repair the hernia adequately.  I discussed with the patient the possibility of injury to testicles, vas deferens, arteries or veins and nerves.  I will start the surgery with minimal invasive approach but there is high chance that surgery will need to converted to open to be able to repair adequately this hernia.  Due to the patient history of atrial fibrillation on anticoagulation I will consult cardiology for recommendations regarding the management of anticoagulation before and after surgery.  All this plan was discussed with the patient and daughter and they agreed to proceed.          PLAN:  1.  Robotic assisted laparoscopic right inguinal  hernia repair with mesh (69485) 2.  CBC, CMP 3.  Avoid taking aspirin 5 days before procedure 4.  Cardiology Clearance 5.  Contact us if has any question or concern.   Patient and her daughter verbalized understanding, all questions were answered, and were agreeable with the plan outlined above.   Herbert Pun, MD  Electronically signed by Herbert Pun, MD

## 2020-12-15 ENCOUNTER — Other Ambulatory Visit: Payer: Self-pay | Admitting: General Surgery

## 2020-12-15 ENCOUNTER — Emergency Department: Payer: Medicare Other

## 2020-12-15 ENCOUNTER — Inpatient Hospital Stay
Admission: EM | Admit: 2020-12-15 | Discharge: 2020-12-18 | DRG: 352 | Disposition: A | Discharge status: Home / Self Care | Payer: Medicare Other | Attending: General Surgery | Admitting: General Surgery

## 2020-12-15 ENCOUNTER — Other Ambulatory Visit: Payer: Self-pay

## 2020-12-15 DIAGNOSIS — Z7902 Long term (current) use of antithrombotics/antiplatelets: Secondary | ICD-10-CM | POA: Diagnosis not present

## 2020-12-15 DIAGNOSIS — K460 Unspecified abdominal hernia with obstruction, without gangrene: Secondary | ICD-10-CM

## 2020-12-15 DIAGNOSIS — K4030 Unilateral inguinal hernia, with obstruction, without gangrene, not specified as recurrent: Secondary | ICD-10-CM | POA: Diagnosis present

## 2020-12-15 DIAGNOSIS — I351 Nonrheumatic aortic (valve) insufficiency: Secondary | ICD-10-CM | POA: Diagnosis present

## 2020-12-15 DIAGNOSIS — Z96651 Presence of right artificial knee joint: Secondary | ICD-10-CM | POA: Diagnosis present

## 2020-12-15 DIAGNOSIS — I480 Paroxysmal atrial fibrillation: Secondary | ICD-10-CM | POA: Diagnosis present

## 2020-12-15 DIAGNOSIS — Z20822 Contact with and (suspected) exposure to covid-19: Secondary | ICD-10-CM | POA: Diagnosis present

## 2020-12-15 DIAGNOSIS — Z8249 Family history of ischemic heart disease and other diseases of the circulatory system: Secondary | ICD-10-CM | POA: Diagnosis not present

## 2020-12-15 DIAGNOSIS — K4031 Unilateral inguinal hernia, with obstruction, without gangrene, recurrent: Principal | ICD-10-CM | POA: Diagnosis present

## 2020-12-15 DIAGNOSIS — Z79899 Other long term (current) drug therapy: Secondary | ICD-10-CM | POA: Diagnosis not present

## 2020-12-15 DIAGNOSIS — K4091 Unilateral inguinal hernia, without obstruction or gangrene, recurrent: Secondary | ICD-10-CM

## 2020-12-15 LAB — URINALYSIS, COMPLETE (UACMP) WITH MICROSCOPIC
Bilirubin Urine: NEGATIVE
Glucose, UA: NEGATIVE mg/dL
Hgb urine dipstick: NEGATIVE
Ketones, ur: NEGATIVE mg/dL
Leukocytes,Ua: NEGATIVE
Nitrite: NEGATIVE
Protein, ur: NEGATIVE mg/dL
Specific Gravity, Urine: 1.019 (ref 1.005–1.030)
Squamous Epithelial / HPF: NONE SEEN (ref 0–5)
pH: 7 (ref 5.0–8.0)

## 2020-12-15 LAB — CBC WITH DIFFERENTIAL/PLATELET
Abs Immature Granulocytes: 0.03 10*3/uL (ref 0.00–0.07)
Basophils Absolute: 0 10*3/uL (ref 0.0–0.1)
Basophils Relative: 0 %
Eosinophils Absolute: 0 10*3/uL (ref 0.0–0.5)
Eosinophils Relative: 0 %
HCT: 41.9 % (ref 39.0–52.0)
Hemoglobin: 14.3 g/dL (ref 13.0–17.0)
Immature Granulocytes: 0 %
Lymphocytes Relative: 18 %
Lymphs Abs: 1.6 10*3/uL (ref 0.7–4.0)
MCH: 33.3 pg (ref 26.0–34.0)
MCHC: 34.1 g/dL (ref 30.0–36.0)
MCV: 97.7 fL (ref 80.0–100.0)
Monocytes Absolute: 0.6 10*3/uL (ref 0.1–1.0)
Monocytes Relative: 7 %
Neutro Abs: 6.6 10*3/uL (ref 1.7–7.7)
Neutrophils Relative %: 75 %
Platelets: 234 10*3/uL (ref 150–400)
RBC: 4.29 MIL/uL (ref 4.22–5.81)
RDW: 14.5 % (ref 11.5–15.5)
WBC: 9 10*3/uL (ref 4.0–10.5)
nRBC: 0 % (ref 0.0–0.2)

## 2020-12-15 LAB — COMPREHENSIVE METABOLIC PANEL
ALT: 18 U/L (ref 0–44)
AST: 22 U/L (ref 15–41)
Albumin: 3.8 g/dL (ref 3.5–5.0)
Alkaline Phosphatase: 99 U/L (ref 38–126)
Anion gap: 7 (ref 5–15)
BUN: 23 mg/dL (ref 8–23)
CO2: 31 mmol/L (ref 22–32)
Calcium: 9.6 mg/dL (ref 8.9–10.3)
Chloride: 102 mmol/L (ref 98–111)
Creatinine, Ser: 0.88 mg/dL (ref 0.61–1.24)
GFR, Estimated: >60 mL/min (ref >60)
Glucose, Bld: 125 mg/dL — ABNORMAL HIGH (ref 70–99)
Potassium: 4 mmol/L (ref 3.5–5.1)
Sodium: 140 mmol/L (ref 135–145)
Total Bilirubin: 0.8 mg/dL (ref 0.3–1.2)
Total Protein: 7.3 g/dL (ref 6.5–8.1)

## 2020-12-15 LAB — LIPASE, BLOOD: Lipase: 54 U/L — ABNORMAL HIGH (ref 11–51)

## 2020-12-15 MED ORDER — ACETAMINOPHEN 650 MG RE SUPP: 650.0000 mg | Freq: Four times a day (QID) | RECTAL | Status: DC | PRN

## 2020-12-15 MED ORDER — ONDANSETRON HCL 4 MG/2ML IJ SOLN
4.0000 mg | Freq: Once | INTRAMUSCULAR | Status: AC
  Administered 2020-12-15: 4 mg via INTRAVENOUS
  Filled 2020-12-15: qty 2

## 2020-12-15 MED ORDER — HYDROCODONE-ACETAMINOPHEN 5-325 MG PO TABS
1.0000 | ORAL_TABLET | ORAL | Status: DC | PRN
  Administered 2020-12-15: 2 via ORAL
  Filled 2020-12-15: qty 2

## 2020-12-15 MED ORDER — CYCLOSPORINE 0.05 % OP EMUL
1.0000 [drp] | Freq: Two times a day (BID) | OPHTHALMIC | Status: DC
  Administered 2020-12-15 – 2020-12-18 (×6): 1 [drp] via OPHTHALMIC
  Filled 2020-12-15 (×8): qty 1

## 2020-12-15 MED ORDER — IOHEXOL 300 MG/ML  SOLN
100.0000 mL | Freq: Once | INTRAMUSCULAR | Status: AC | PRN
  Administered 2020-12-15: 100 mL via INTRAVENOUS

## 2020-12-15 MED ORDER — SODIUM CHLORIDE 0.9 % IV SOLN: INTRAVENOUS | Status: DC

## 2020-12-15 MED ORDER — POTASSIUM CHLORIDE CRYS ER 10 MEQ PO TBCR
10.0000 meq | EXTENDED_RELEASE_TABLET | Freq: Every day | ORAL | Status: DC
  Administered 2020-12-16 – 2020-12-18 (×3): 10 meq via ORAL
  Filled 2020-12-15 (×3): qty 1

## 2020-12-15 MED ORDER — ONDANSETRON 4 MG PO TBDP: 4.0000 mg | ORAL_TABLET | Freq: Four times a day (QID) | ORAL | Status: DC | PRN

## 2020-12-15 MED ORDER — ACETAMINOPHEN 325 MG PO TABS: 650.0000 mg | ORAL_TABLET | Freq: Four times a day (QID) | ORAL | Status: DC | PRN

## 2020-12-15 MED ORDER — POLYETHYLENE GLYCOL 3350 17 GM/SCOOP PO POWD
1.0000 | Freq: Once | ORAL | Status: AC
  Administered 2020-12-15: 255 g via ORAL
  Filled 2020-12-15: qty 255

## 2020-12-15 MED ORDER — MORPHINE SULFATE (PF) 2 MG/ML IV SOLN: 2.0000 mg | INTRAVENOUS | Status: DC | PRN

## 2020-12-15 MED ORDER — ONDANSETRON HCL 4 MG/2ML IJ SOLN: 4.0000 mg | Freq: Four times a day (QID) | INTRAMUSCULAR | Status: DC | PRN

## 2020-12-15 MED ORDER — DOFETILIDE 125 MCG PO CAPS
125.0000 ug | ORAL_CAPSULE | Freq: Two times a day (BID) | ORAL | Status: DC
  Administered 2020-12-15 – 2020-12-18 (×6): 125 ug via ORAL
  Filled 2020-12-15 (×8): qty 1

## 2020-12-15 MED ORDER — MORPHINE SULFATE (PF) 4 MG/ML IV SOLN
4.0000 mg | Freq: Once | INTRAVENOUS | Status: AC
  Administered 2020-12-15: 4 mg via INTRAVENOUS
  Filled 2020-12-15: qty 1

## 2020-12-15 MED ORDER — PANTOPRAZOLE SODIUM 40 MG IV SOLR
40.0000 mg | Freq: Every day | INTRAVENOUS | Status: DC
  Administered 2020-12-15 – 2020-12-17 (×3): 40 mg via INTRAVENOUS
  Filled 2020-12-15 (×3): qty 40

## 2020-12-15 NOTE — ED Triage Notes (Signed)
Pt states he is suppose to have hernia surgery in 2 weeks but today having severe right inguinal pain that is radiating into the abd

## 2020-12-15 NOTE — H&P (Signed)
SURGICAL CONSULTATION NOTE   HISTORY OF PRESENT ILLNESS (HPI):  85 y.o. male presented to [facility] ED for evaluation of right inguinal hernia pain. Patient reports that the pain on his hernia has been increasing since yesterday. Pain localized to the right groin. Pain does not radiates. Aggravating factor is walking. No alleviating factor. Denies abdominal distention nausea or vomiting. Reports passing gas having bowel movement.   At ED he was found without leukocytosis. CT scan of the abdomen shows large right inguinal hernia with cecum. No sign of bowel obstruction. There is large amount of stool in the incarcerated cecum and distal large bowel.  I personally evaluated the images.   I had previously evaluated the patient at the office and scheduled for elective surgery on 12/30/20. Due to increasing symptoms, I discuss with patient about surgical evaluation.    Surgery is consulted by Dr. Tamala Julian in this context for evaluation and management of right incarcerated inguinal hernia.  PAST MEDICAL HISTORY (PMH):  Past Medical History:  Diagnosis Date  . Anemia   . Aortic valve insufficiency   . Arthritis    hands  . Atherosclerosis of aorta ([place])   . Cancer ([place])    skin precancerous areas have been removed  . Carotid stenosis   . History of kidney stones 2010  . Mixed hyperlipidemia   . Paroxysmal atrial fibrillation (HCC)   . Venous insufficiency of both lower extremities      PAST SURGICAL HISTORY ([place]):  Past Surgical History:  Procedure Laterality Date  . CARDIAC ELECTROPHYSIOLOGY STUDY AND ABLATION  2019   [facility]  . EYE SURGERY Bilateral 2012   cataracts  . HERNIA REPAIR Left 2008   inguinal  . INGUINAL HERNIA REPAIR Right 06/24/2016   Procedure: HERNIA REPAIR INGUINAL ADULT;  Surgeon: Leonie Green, MD;  Location: [facility] ORS;  Service: General;  Laterality: Right;  . TOTAL KNEE ARTHROPLASTY Right 07/16/2020   Procedure: RIGHT TOTAL KNEE ARTHROPLASTY;  Surgeon: Thornton Park, MD;  Location: [facility] ORS;  Service: Orthopedics;  Laterality: Right;  . XI ROBOTIC ASSISTED INGUINAL HERNIA REPAIR WITH MESH Bilateral 01/20/2020   Procedure: XI ROBOTIC ASSISTED INGUINAL HERNIA REPAIR WITH MESH;  Surgeon: Herbert Pun, MD;  Location: [facility] ORS;  Service: General;  Laterality: Bilateral;     MEDICATIONS:  Prior to Admission medications   Medication Sig Start Date End Date Taking? Authorizing Provider  cycloSPORINE (RESTASIS) 0.05 % ophthalmic emulsion Place 1 drop into both eyes 2 (two) times daily.   Yes [provider]  dabigatran (PRADAXA) 150 MG CAPS capsule Take 150 mg by mouth 2 (two) times daily.   Yes [provider]  dofetilide (TIKOSYN) 125 MCG capsule Take 125 mcg by mouth in the morning and at bedtime. 12/14/19  Yes [provider]  ferrous sulfate 325 (65 FE) MG tablet Take 325 mg by mouth daily with breakfast.    Yes [provider]  folic acid (FOLVITE) 858 MCG tablet Take 800 mcg by mouth daily.   Yes [provider]  metroNIDAZOLE (METROGEL) 1 % gel Apply 1 application topically at bedtime.    Yes [provider]  Multiple Vitamin (MULTIVITAMIN WITH MINERALS) TABS tablet Take 1 tablet by mouth daily.   Yes [provider]  potassium chloride (MICRO-K) 10 MEQ CR capsule Take 10 mEq by mouth daily.    Yes [provider]  vitamin B-12 (CYANOCOBALAMIN) 1000 MCG tablet Take 1,000 mcg by mouth daily.   Yes [provider]  ALLERGIES:  Allergies  Allergen Reactions  . Lactose Intolerance (Gi) Diarrhea    GI- upset  . Tramadol Other (See Comments)    Confusion      SOCIAL HISTORY:  Social History   Socioeconomic History  . Marital status: Widowed    Spouse name: Not on file  . Number of children: Not on file  . Years of education: Not on file  . Highest education level: Not on file  Occupational History  . Not on file  Tobacco Use  . Smoking status: Never  Smoker  . Smokeless tobacco: Never Used  Vaping Use  . Vaping Use: Not on file  Substance and Sexual Activity  . Alcohol use: Yes    Alcohol/week: 1.0 standard drink    Types: 1 Glasses of wine per week    Comment: glass of wine week  . Drug use: No  . Sexual activity: Yes  Other Topics Concern  . Not on file  Social History Narrative  . Not on file   Social Determinants of Health   Financial Resource Strain: Not on file  Food Insecurity: Not on file  Transportation Needs: Not on file  Physical Activity: Not on file  Stress: Not on file  Social Connections: Not on file  Intimate Partner Violence: Not on file      FAMILY HISTORY:  Family History  Problem Relation Age of Onset  . Heart attack Father   . Atrial fibrillation Brother      REVIEW OF SYSTEMS:  Constitutional: denies weight loss, fever, chills, or sweats  Eyes: denies any other vision changes, history of eye injury  ENT: denies sore throat, hearing problems  Respiratory: denies shortness of breath, wheezing  Cardiovascular: denies chest pain, palpitations  Gastrointestinal: abdominal pain, nausea and vomiting Genitourinary: denies burning with urination or urinary frequency Musculoskeletal: denies any other joint pains or cramps  Skin: denies any other rashes or skin discolorations  Neurological: denies any other headache, dizziness, weakness  Psychiatric: denies any other depression, anxiety   All other review of systems were negative   VITAL SIGNS:  Temp:  [97.9 F (36.6 C)] 97.9 F (36.6 C) (04/19 1500) Pulse Rate:  [66-74] 68 (04/19 1930) Resp:  [17-18] 18 (04/19 1930) BP: (161-194)/(90-112) 162/96 (04/19 1930) SpO2:  [96 %-100 %] 98 % (04/19 1930) Weight:  [71.7 kg] 71.7 kg (04/19 1458)     Height: 6\' 2"  (188 cm) Weight: 71.7 kg BMI (Calculated): 20.28   INTAKE/OUTPUT:  This shift: No intake/output data recorded.  Last 2 shifts: @IOLAST2SHIFTS @   PHYSICAL EXAM:  Constitutional:  --  Normal body habitus  -- Awake, alert, and oriented x3  Eyes:  -- Pupils equally round and reactive to light  -- No scleral icterus  Ear, nose, and throat:  -- No jugular venous distension  Pulmonary:  -- No crackles  -- Equal breath sounds bilaterally -- Breathing non-labored at rest Cardiovascular:  -- S1, S2 present  -- No pericardial rubs Gastrointestinal:  -- Abdomen soft, nontender, non-distended, no guarding or rebound tenderness -- Large right inguinal hernia, not reducible. No skin changes.   Musculoskeletal and Integumentary:  -- Wounds: None appreciated -- Extremities: B/L UE and LE FROM, hands and feet warm, no edema  Neurologic:  -- Motor function: intact and symmetric -- Sensation: intact and symmetric   Labs:  CBC Latest Ref Rng & Units 12/15/2020 07/20/2020 07/18/2020  WBC 4.0 - 10.5 K/uL 9.0 10.8(H) 11.5(H)  Hemoglobin 13.0 - 17.0 g/dL 14.3  10.2(L) 10.4(L)  Hematocrit 39.0 - 52.0 % 41.9 30.7(L) 29.9(L)  Platelets 150 - 400 K/uL 234 182 149(L)   CMP Latest Ref Rng & Units 12/15/2020 07/21/2020 07/20/2020  Glucose 70 - 99 mg/dL 125(H) - 133(H)  BUN 8 - 23 mg/dL 23 - 26(H)  Creatinine 0.61 - 1.24 mg/dL 0.88 0.77 0.73  Sodium 135 - 145 mmol/L 140 - 136  Potassium 3.5 - 5.1 mmol/L 4.0 3.5 3.7  Chloride 98 - 111 mmol/L 102 - 96(L)  CO2 22 - 32 mmol/L 31 - 29  Calcium 8.9 - 10.3 mg/dL 9.6 - 8.9  Total Protein 6.5 - 8.1 g/dL 7.3 - -  Total Bilirubin 0.3 - 1.2 mg/dL 0.8 - -  Alkaline Phos 38 - 126 U/L 99 - -  AST 15 - 41 U/L 22 - -  ALT 0 - 44 U/L 18 - -    Imaging studies:  EXAM: CT ABDOMEN AND PELVIS WITH CONTRAST  TECHNIQUE: Multidetector CT imaging of the abdomen and pelvis was performed using the standard protocol following bolus administration of intravenous contrast.  CONTRAST:  125mL OMNIPAQUE IOHEXOL 300 MG/ML  SOLN  COMPARISON:  Pelvic CT 10/04/2019  FINDINGS: Lower chest: Dependent atelectasis at both lung bases. There is no pleural  or pericardial effusion. The heart is enlarged and exaggerated by the presence of a prominent pectus deformity of the sternum.  Hepatobiliary: The liver is normal in density without suspicious focal abnormality. Lateral position of the gallbladder without wall thickening, gallstones or significant biliary dilatation.  Pancreas: The pancreas appears atrophied without focal abnormality, ductal dilatation or surrounding inflammation.  Spleen: 10 mm low-density lesion medially on image 18/2, unlikely to be clinically significant. Otherwise unremarkable.  Adrenals/Urinary Tract: Indeterminate left adrenal nodule measuring 2.4 x 1.7 cm on image 27/2. This measures 47 HU on the immediate postcontrast images and 41 HU on the delayed post-contrast images. The right adrenal gland appears normal. There are large renal cysts bilaterally, measuring up to 9.7 cm in the lower pole of the right kidney. No evidence of renal mass, urinary tract calculus or hydronephrosis. The bladder appears unremarkable.  Stomach/Bowel: No enteric contrast was administered. The stomach is incompletely distended and demonstrates atypical luminal constriction and wall thickening along the greater curvature. The proximal small bowel appears normal. There is mild dilatation of the distal small bowel with scattered air-fluid levels. There has been interval development of a large right inguinal hernia which contains the cecum and a large amount of stool. There is surrounding fluid in the hernia sac and mild proximal colonic wall thickening which could indicate incarceration. The appendix is no longer clearly seen. The distal colon appears normal.  Vascular/Lymphatic: There are no enlarged abdominal or pelvic lymph nodes. Aortic and branch vessel atherosclerosis without acute vascular findings.  Reproductive: The prostate gland appears stable with median lobe hypertrophy and postsurgical changes related to  previous brachytherapy implants or UroLift procedure.  Other: Small amount of perihepatic ascites. No free air or focal extraluminal fluid collection. Stable large linear calcification within the small bowel mesentery, measuring up to 3.4 cm in length on coronal image 23/5. This was less evident on previous pelvic CT due to the presence of surrounding enteric contrast.  Musculoskeletal: No acute or significant osseous findings. There are degenerative changes throughout the spine and asymmetric right hip arthropathy.  IMPRESSION: 1. Interval development of a large right inguinal hernia containing the cecum. There is associated right colonic wall thickening and surrounding fluid which may indicate incarceration.  No high-grade bowel obstruction. 2. Atypical constriction of the gastric lumen with wall thickening along the greater curvature, not previously imaged. 3. Indeterminate left adrenal nodule, not previously imaged. This could be further characterized with follow-up noncontrast CT within 6 months. 4. Incidental findings including a prominent pectus deformity of the sternum, multiple large bilateral renal cysts, postsurgical changes in the prostate gland, small-bowel mesenteric calcification and Aortic Atherosclerosis (ICD10-I70.0).   Electronically Signed   By: Richardean Sale M.D.   On: 12/15/2020 16:33  Assessment/Plan:  85 y.o. male with large right recurrent incarcerated inguinal hernia, complicated by pertinent comorbidities including atrial fibrillation on anticoagulation.  Patient with worsening pain on right inguinal hernia. Physical exam nor CT scan shows any sign of strangulation. I consider that constipation is a factor in this difficult case. Since there is no sign of obstruction, I will start with bowel prep to see if stool in hernia is cleared and hernia can be reduced. Will also hold anticoagulation. Will consider repairing the hernia during this admission  to avoid recurrent admission or complications such as strangulation.    All of the above findings and recommendations were discussed with the patient and his family, and all of patient's and his family's questions were answered to their expressed satisfaction.  Arnold Long, MD

## 2020-12-15 NOTE — ED Notes (Addendum)
Report received from [facility]. Pt resting on stretcher in NAD. AAOx4, family at bedside

## 2020-12-15 NOTE — ED Provider Notes (Addendum)
[facility] Emergency Department Provider Note  ____________________________________________   Event Date/Time   First MD Initiated Contact with Patient 12/15/20 1455     (approximate)  I have reviewed the triage vital signs and the nursing notes.   HISTORY  Chief Complaint Abdominal Pain    HPI Terry Collins is a 85 y.o. male with history of a hernia presents emergency department complaining of increased abdominal pain and the hernia is enlarging.  Patient supposed to have surgery with Dr. Peyton Najjar.  He did call [facility].  Patient denies fever or chills.  He did have a bowel movement today.    Past Medical History:  Diagnosis Date  . Anemia   . Aortic valve insufficiency   . Arthritis    hands  . Atherosclerosis of aorta ([place])   . Cancer ([place])    skin precancerous areas have been removed  . Carotid stenosis   . History of kidney stones 2010  . Mixed hyperlipidemia   . Paroxysmal atrial fibrillation (H[facility])   . Venous insufficiency of both lower extremities     Patient Active Problem List   Diagnosis Date Noted  . Paroxysmal atrial fibrillation (HCC)   . Leukocytosis   . Acute metabolic encephalopathy   . Normocytic anemia   . S/P TKR (total knee replacement) using cement, right   . Total knee replacement status, right 07/16/2020    Past Surgical History:  Procedure Laterality Date  . CARDIAC ELECTROPHYSIOLOGY STUDY AND ABLATION  2019   [facility]  . EYE SURGERY Bilateral 2012   cataracts  . HERNIA REPAIR Left 2008   inguinal  . INGUINAL HERNIA REPAIR Right 06/24/2016   Procedure: HERNIA REPAIR INGUINAL ADULT;  Surgeon: Leonie Green, MD;  Location: [facility] ORS;  Service: General;  Laterality: Right;  . TOTAL KNEE ARTHROPLASTY Right 07/16/2020   Procedure: RIGHT TOTAL KNEE ARTHROPLASTY;  Surgeon: Thornton Park, MD;  Location: [facility] ORS;  Service: Orthopedics;  Laterality: Right;  . XI ROBOTIC ASSISTED INGUINAL HERNIA REPAIR WITH  MESH Bilateral 01/20/2020   Procedure: XI ROBOTIC ASSISTED INGUINAL HERNIA REPAIR WITH MESH;  Surgeon: Herbert Pun, MD;  Location: [facility] ORS;  Service: General;  Laterality: Bilateral;    Prior to Admission medications   Medication Sig Start Date End Date Taking? Authorizing Provider  cycloSPORINE (RESTASIS) 0.05 % ophthalmic emulsion Place 1 drop into both eyes 2 (two) times daily.    [provider]  dabigatran (PRADAXA) 150 MG CAPS capsule Take 150 mg by mouth 2 (two) times daily.    [provider]  dofetilide (TIKOSYN) 125 MCG capsule Take 125 mcg by mouth in the morning and at bedtime. 12/14/19   [provider]  ferrous sulfate 325 (65 FE) MG tablet Take 325 mg by mouth daily with breakfast.     [provider]  folic acid (FOLVITE) 144 MCG tablet Take 800 mcg by mouth daily.    [provider]  metroNIDAZOLE (METROGEL) 1 % gel Apply 1 application topically at bedtime.     [provider]  Multiple Vitamin (MULTIVITAMIN WITH MINERALS) TABS tablet Take 1 tablet by mouth daily.    [provider]  potassium chloride (MICRO-K) 10 MEQ CR capsule Take 10 mEq by mouth daily.     [provider]  vitamin B-12 (CYANOCOBALAMIN) 1000 MCG tablet Take 1,000 mcg by mouth daily.    [provider]    Allergies Lactose intolerance (gi) and Tramadol  Family History  Problem Relation  Age of Onset  . Heart attack Father   . Atrial fibrillation Brother     Social History Social History   Tobacco Use  . Smoking status: Never Smoker  . Smokeless tobacco: Never Used  Substance Use Topics  . Alcohol use: Yes    Alcohol/week: 1.0 standard drink    Types: 1 Glasses of wine per week    Comment: glass of wine week  . Drug use: No    Review of Systems  Constitutional: No fever/chills Eyes: No visual changes. ENT: No sore throat. Respiratory: Denies cough Cardiovascular: Denies chest  pain Gastrointestinal: Positive abdominal pain Genitourinary: Negative for dysuria. Musculoskeletal: Negative for back pain. Skin: Negative for rash. Psychiatric: no mood changes,     ____________________________________________   PHYSICAL EXAM:  VITAL SIGNS: ED Triage Vitals  Enc Vitals Group     BP 12/15/20 1500 (!) 194/98     Pulse Rate 12/15/20 1500 70     Resp 12/15/20 1500 17     Temp 12/15/20 1500 97.9 F (36.6 C)     Temp Source 12/15/20 1500 Oral     SpO2 12/15/20 1500 100 %     Weight 12/15/20 1458 158 lb (71.7 kg)     Height 12/15/20 1458 6\' 2"  (1.88 m)     Head Circumference --      Peak Flow --      Pain Score 12/15/20 1457 7     Pain Loc --      Pain Edu? --      Excl. in [place]? --     Constitutional: Alert and oriented. Well appearing and in no acute distress. Eyes: Conjunctivae are normal.  Head: Atraumatic. Nose: No congestion/rhinnorhea. Mouth/Throat: Mucous membranes are moist.   Neck:  supple no lymphadenopathy noted Cardiovascular: Normal rate, regular rhythm.  Respiratory: Normal respiratory effort.  No retractions, Abd: soft tender, large hernia noted, unable to reduce, bs normal all 4 quad GU: deferred Musculoskeletal: FROM all extremities, warm and well perfused Neurologic:  Normal speech and language.  Skin:  Skin is warm, dry and intact. No rash noted. Psychiatric: Mood and affect are normal. Speech and behavior are normal.  ____________________________________________   LABS (all labs ordered are listed, but only abnormal results are displayed)  Labs Reviewed  COMPREHENSIVE METABOLIC PANEL - Abnormal; Notable for the following components:      Result Value   Glucose, Bld 125 (*)    All other components within normal limits  LIPASE, BLOOD - Abnormal; Notable for the following components:   Lipase 54 (*)    All other components within normal limits  URINALYSIS, COMPLETE (UACMP) WITH MICROSCOPIC - Abnormal; Notable for the following  components:   Color, Urine YELLOW (*)    APPearance CLOUDY (*)    Bacteria, UA RARE (*)    All other components within normal limits  SARS CORONAVIRUS 2 (TAT 6-24 HRS)  CBC WITH DIFFERENTIAL/PLATELET   ____________________________________________   ____________________________________________  RADIOLOGY  CT abdomen/pelvis IV contrast  ____________________________________________   PROCEDURES  Procedure(s) performed: No  Procedures    ____________________________________________   INITIAL IMPRESSION / ASSESSMENT AND PLAN / ED COURSE  Pertinent labs & imaging results that were available during my care of the patient were reviewed by me and considered in my medical decision making (see chart for details).   Patient is a 85 year old male presents with increased abdominal pain from his hernia.  Is been unable to reduce the hernia.  See HPI.  Physical exam  shows patient appears stable at this time.  His daughter is present she is POA  At this time plan is to get CT abdomen/pelvis.  Consult with Dr. Peyton Najjar.  Patient is on a blood thinner Dr. Peyton Najjar is hopeful will be able to reduce it by using ice to soften the area.  Patient CBC, metabolic panel are normal, lipase is small increase of 54  CT abdomen/pelvis with contrast ordered   CT reviewed by me confirmed by radiology to have a possible incarcerated hernia.  Dr. Peyton Najjar and see the patient, he will be admitting to his service.  Terry Collins was evaluated in Emergency Department on 12/15/2020 for the symptoms described in the history of present illness. He was evaluated in the context of the global COVID-19 pandemic, which necessitated consideration that the patient might be at risk for infection with the SARS-CoV-2 virus that causes COVID-19. Institutional protocols and algorithms that pertain to the evaluation of patients at risk for COVID-19 are in a state of rapid change based on information released by regulatory  bodies including the CDC and federal and state organizations. These policies and algorithms were followed during the patient's care in the ED.    As part of my medical decision making, I reviewed the following data within the [place] History obtained from family, Nursing notes reviewed and incorporated, Labs reviewed , Old chart reviewed, Radiograph reviewed , A consult was requested and obtained from this/these consultant(s) Surgery, Notes from prior ED visits and [place] Controlled Substance Database  ____________________________________________   FINAL CLINICAL IMPRESSION(S) / ED DIAGNOSES  Final diagnoses:  Incarcerated hernia      NEW MEDICATIONS STARTED DURING THIS VISIT:  New Prescriptions   No medications on file     Note:  This document was prepared using Dragon voice recognition software and may include unintentional dictation errors.    Versie Starks, PA-C 12/15/20 1653    Versie Starks, PA-C 12/15/20 1706    Lucrezia Starch, MD 12/15/20 [phone]

## 2020-12-16 ENCOUNTER — Encounter
Admission: EM | Disposition: A | Discharge status: Home / Self Care | Payer: Self-pay | Source: Home / Self Care | Attending: General Surgery

## 2020-12-16 ENCOUNTER — Inpatient Hospital Stay: Payer: Medicare Other | Admitting: Anesthesiology

## 2020-12-16 ENCOUNTER — Encounter: Payer: Self-pay | Admitting: General Surgery

## 2020-12-16 ENCOUNTER — Ambulatory Visit: Admit: 2020-12-16 | Payer: Medicare Other | Admitting: General Surgery

## 2020-12-16 HISTORY — PX: XI ROBOTIC ASSISTED INGUINAL HERNIA REPAIR WITH MESH: SHX6706

## 2020-12-16 LAB — SARS CORONAVIRUS 2 (TAT 6-24 HRS): SARS Coronavirus 2: NEGATIVE

## 2020-12-16 SURGERY — REPAIR, HERNIA, INGUINAL, ROBOT-ASSISTED, LAPAROSCOPIC, USING MESH
Anesthesia: General | Site: Groin | Laterality: Right

## 2020-12-16 MED ORDER — CEFAZOLIN SODIUM-DEXTROSE 2-3 GM-%(50ML) IV SOLR
INTRAVENOUS | Status: DC | PRN
  Administered 2020-12-16: 2 g via INTRAVENOUS

## 2020-12-16 MED ORDER — SODIUM CHLORIDE (PF) 0.9 % IJ SOLN
INTRAMUSCULAR | Status: AC
  Filled 2020-12-16: qty 10

## 2020-12-16 MED ORDER — CEFAZOLIN SODIUM-DEXTROSE 1-4 GM/50ML-% IV SOLN
INTRAVENOUS | Status: DC | PRN
  Administered 2020-12-16: 1 g via INTRAVENOUS

## 2020-12-16 MED ORDER — PHENYLEPHRINE HCL (PRESSORS) 10 MG/ML IV SOLN
INTRAVENOUS | Status: DC | PRN
  Administered 2020-12-16 (×6): 100 ug via INTRAVENOUS

## 2020-12-16 MED ORDER — FENTANYL CITRATE (PF) 100 MCG/2ML IJ SOLN
INTRAMUSCULAR | Status: AC
  Filled 2020-12-16: qty 2

## 2020-12-16 MED ORDER — DEXAMETHASONE SODIUM PHOSPHATE 10 MG/ML IJ SOLN
INTRAMUSCULAR | Status: DC | PRN
  Administered 2020-12-16: 5 mg via INTRAVENOUS

## 2020-12-16 MED ORDER — PROPOFOL 10 MG/ML IV BOLUS
INTRAVENOUS | Status: DC | PRN
  Administered 2020-12-16: 150 mg via INTRAVENOUS

## 2020-12-16 MED ORDER — CEFAZOLIN SODIUM-DEXTROSE 2-4 GM/100ML-% IV SOLN
INTRAVENOUS | Status: AC
  Filled 2020-12-16: qty 100

## 2020-12-16 MED ORDER — ACETAMINOPHEN 10 MG/ML IV SOLN
INTRAVENOUS | Status: AC
  Filled 2020-12-16: qty 100

## 2020-12-16 MED ORDER — SUCCINYLCHOLINE CHLORIDE 20 MG/ML IJ SOLN
INTRAMUSCULAR | Status: DC | PRN
  Administered 2020-12-16: 100 mg via INTRAVENOUS

## 2020-12-16 MED ORDER — ONDANSETRON HCL 4 MG/2ML IJ SOLN
INTRAMUSCULAR | Status: DC | PRN
  Administered 2020-12-16: 4 mg via INTRAVENOUS

## 2020-12-16 MED ORDER — SUGAMMADEX SODIUM 200 MG/2ML IV SOLN
INTRAVENOUS | Status: DC | PRN
  Administered 2020-12-16: 150 mg via INTRAVENOUS

## 2020-12-16 MED ORDER — BUPIVACAINE-EPINEPHRINE 0.25% -1:200000 IJ SOLN
INTRAMUSCULAR | Status: DC | PRN
  Administered 2020-12-16: 30 mL

## 2020-12-16 MED ORDER — VISTASEAL 10 ML SINGLE DOSE KIT
PACK | CUTANEOUS | Status: DC | PRN
  Administered 2020-12-16: 10 mL via TOPICAL

## 2020-12-16 MED ORDER — BUPIVACAINE-EPINEPHRINE (PF) 0.25% -1:200000 IJ SOLN
INTRAMUSCULAR | Status: AC
  Filled 2020-12-16: qty 30

## 2020-12-16 MED ORDER — SEVOFLURANE IN SOLN
RESPIRATORY_TRACT | Status: AC
  Filled 2020-12-16: qty 250

## 2020-12-16 MED ORDER — INDOCYANINE GREEN 25 MG IV SOLR
INTRAVENOUS | Status: DC | PRN
  Administered 2020-12-16: 2.5 mg via INTRAVENOUS

## 2020-12-16 MED ORDER — VISTASEAL 10 ML SINGLE DOSE KIT
PACK | CUTANEOUS | Status: AC
  Filled 2020-12-16: qty 10

## 2020-12-16 MED ORDER — CEFAZOLIN SODIUM 1 G IJ SOLR
INTRAMUSCULAR | Status: AC
  Filled 2020-12-16: qty 10

## 2020-12-16 MED ORDER — SODIUM CHLORIDE 0.9 % IV SOLN
INTRAVENOUS | Status: DC | PRN
  Administered 2020-12-16: 20 ug/min via INTRAVENOUS

## 2020-12-16 MED ORDER — LACTATED RINGERS IV SOLN: INTRAVENOUS | Status: DC | PRN

## 2020-12-16 MED ORDER — ACETAMINOPHEN 10 MG/ML IV SOLN
INTRAVENOUS | Status: DC | PRN
  Administered 2020-12-16: 1000 mg via INTRAVENOUS

## 2020-12-16 MED ORDER — ROCURONIUM BROMIDE 100 MG/10ML IV SOLN
INTRAVENOUS | Status: DC | PRN
  Administered 2020-12-16 (×2): 30 mg via INTRAVENOUS
  Administered 2020-12-16: 20 mg via INTRAVENOUS
  Administered 2020-12-16 (×2): 30 mg via INTRAVENOUS

## 2020-12-16 MED ORDER — FENTANYL CITRATE (PF) 100 MCG/2ML IJ SOLN
INTRAMUSCULAR | Status: DC | PRN
  Administered 2020-12-16 (×3): 50 ug via INTRAVENOUS

## 2020-12-16 SURGICAL SUPPLY — 60 items
APPLICATOR VISTASEAL FLEXIBLE (MISCELLANEOUS) ×3 IMPLANT
BAG INFUSER PRESSURE 100CC (MISCELLANEOUS) IMPLANT
BLADE SURG SZ11 CARB STEEL (BLADE) ×3 IMPLANT
CANISTER SUCT 1200ML W/VALVE (MISCELLANEOUS) ×3 IMPLANT
CANNULA REDUC XI 12-8 STAPL (CANNULA) ×1
CANNULA REDUCER 12-8 DVNC XI (CANNULA) ×2 IMPLANT
CHLORAPREP W/TINT 26 (MISCELLANEOUS) ×3 IMPLANT
COVER TIP SHEARS 8 DVNC (MISCELLANEOUS) ×2 IMPLANT
COVER TIP SHEARS 8MM DA VINCI (MISCELLANEOUS) ×1
COVER WAND RF STERILE (DRAPES) ×6 IMPLANT
DEFOGGER SCOPE WARMER CLEARIFY (MISCELLANEOUS) ×3 IMPLANT
DERMABOND ADVANCED (GAUZE/BANDAGES/DRESSINGS) ×1
DERMABOND ADVANCED .7 DNX12 (GAUZE/BANDAGES/DRESSINGS) ×2 IMPLANT
DRAPE ARM DVNC X/XI (DISPOSABLE) ×6 IMPLANT
DRAPE COLUMN DVNC XI (DISPOSABLE) ×2 IMPLANT
DRAPE DA VINCI XI ARM (DISPOSABLE) ×3
DRAPE DA VINCI XI COLUMN (DISPOSABLE) ×1
ELECT REM PT RETURN 9FT ADLT (ELECTROSURGICAL) ×3
ELECTRODE REM PT RTRN 9FT ADLT (ELECTROSURGICAL) ×2 IMPLANT
GLOVE SURG ENC MOIS LTX SZ6.5 (GLOVE) ×6 IMPLANT
GLOVE SURG UNDER POLY LF SZ6.5 (GLOVE) ×6 IMPLANT
GOWN STRL REUS W/ TWL LRG LVL3 (GOWN DISPOSABLE) ×6 IMPLANT
GOWN STRL REUS W/TWL LRG LVL3 (GOWN DISPOSABLE) ×3
IRRIGATION STRYKERFLOW (MISCELLANEOUS) ×2 IMPLANT
IRRIGATOR STRYKERFLOW (MISCELLANEOUS) ×3
IRRIGATOR SUCT 8 DISP DVNC XI (IRRIGATION / IRRIGATOR) IMPLANT
IRRIGATOR SUCTION 8MM XI DISP (IRRIGATION / IRRIGATOR)
IV CATH ANGIO 12GX3 LT BLUE (NEEDLE) ×3 IMPLANT
IV NS 1000ML (IV SOLUTION) ×1
IV NS 1000ML BAXH (IV SOLUTION) ×2 IMPLANT
KIT IMAGING PINPOINTPAQ (MISCELLANEOUS) ×3 IMPLANT
KIT PINK PAD W/HEAD ARE REST (MISCELLANEOUS) ×3
KIT PINK PAD W/HEAD ARM REST (MISCELLANEOUS) ×2 IMPLANT
LABEL OR SOLS (LABEL) IMPLANT
MANIFOLD NEPTUNE II (INSTRUMENTS) ×3 IMPLANT
MESH 3DMAX 5X7 RT XLRG (Mesh General) ×3 IMPLANT
NEEDLE HYPO 22GX1.5 SAFETY (NEEDLE) ×3 IMPLANT
NEEDLE INSUFFLATION 14GA 120MM (NEEDLE) ×3 IMPLANT
OBTURATOR OPTICAL STANDARD 8MM (TROCAR) ×1
OBTURATOR OPTICAL STND 8 DVNC (TROCAR) ×2
OBTURATOR OPTICALSTD 8 DVNC (TROCAR) ×2 IMPLANT
PACK LAP CHOLECYSTECTOMY (MISCELLANEOUS) ×3 IMPLANT
SEAL CANN UNIV 5-8 DVNC XI (MISCELLANEOUS) ×6 IMPLANT
SEAL XI 5MM-8MM UNIVERSAL (MISCELLANEOUS) ×3
SET TUBE SMOKE EVAC HIGH FLOW (TUBING) ×3 IMPLANT
SOLUTION ELECTROLUBE (MISCELLANEOUS) ×3 IMPLANT
SPONGE LAP 4X18 RFD (DISPOSABLE) ×3 IMPLANT
STAPLER CANNULA SEAL DVNC XI (STAPLE) ×2 IMPLANT
STAPLER CANNULA SEAL XI (STAPLE) ×1
SUT MNCRL 4-0 (SUTURE) ×2
SUT MNCRL 4-0 27XMFL (SUTURE) ×4
SUT VIC AB 2-0 SH 27 (SUTURE) ×1
SUT VIC AB 2-0 SH 27XBRD (SUTURE) ×2 IMPLANT
SUT VIC AB 3-0 SH 27 (SUTURE) ×1
SUT VIC AB 3-0 SH 27X BRD (SUTURE) ×2 IMPLANT
SUT VICRYL 0 AB UR-6 (SUTURE) ×3 IMPLANT
SUT VLOC 90 S/L VL9 GS22 (SUTURE) ×3 IMPLANT
SUTURE MNCRL 4-0 27XMF (SUTURE) ×4 IMPLANT
TAPE TRANSPORE STRL 2 31045 (GAUZE/BANDAGES/DRESSINGS) IMPLANT
TRAY FOLEY MTR SLVR 16FR STAT (SET/KITS/TRAYS/PACK) ×3 IMPLANT

## 2020-12-16 NOTE — Anesthesia Postprocedure Evaluation (Signed)
Anesthesia Post Note  Patient: Haidar Muse  Procedure(s) Performed: XI ROBOTIC ASSISTED INGUINAL HERNIA REPAIR WITH MESH (Right Groin) INDOCYANINE GREEN FLUORESCENCE IMAGING (ICG)  Patient location during evaluation: PACU Anesthesia Type: General Level of consciousness: awake and alert Pain management: pain level controlled Vital Signs Assessment: post-procedure vital signs reviewed and stable Respiratory status: spontaneous breathing, nonlabored ventilation, respiratory function stable and patient connected to nasal cannula oxygen Cardiovascular status: blood pressure returned to baseline and stable Postop Assessment: no apparent nausea or vomiting Anesthetic complications: no   No complications documented.   Last Vitals:  Vitals:   12/16/20 1913 12/16/20 2019  BP: 122/87 (!) 146/84  Pulse: 69 75  Resp: 18 17  Temp: 36.5 C (!) 36.4 C  SpO2: 95% 99%    Last Pain:  Vitals:   12/16/20 2019  TempSrc: Axillary  PainSc:                  Martha Clan

## 2020-12-16 NOTE — Anesthesia Preprocedure Evaluation (Signed)
Anesthesia Evaluation  Patient identified by MRN, date of birth, ID band Patient awake    Reviewed: Allergy & Precautions, H&P , NPO status , Patient's Chart, lab work & pertinent test results, reviewed documented beta blocker date and time   Airway Mallampati: II  TM Distance: >3 FB Neck ROM: full    Dental  (+) Teeth Intact   Pulmonary neg pulmonary ROS,    Pulmonary exam normal        Cardiovascular Exercise Tolerance: Good negative cardio ROS Normal cardiovascular exam Rhythm:regular Rate:Normal     Neuro/Psych negative neurological ROS  negative psych ROS   GI/Hepatic negative GI ROS, Neg liver ROS,   Endo/Other  negative endocrine ROS  Renal/GU negative Renal ROS  negative genitourinary   Musculoskeletal   Abdominal   Peds  Hematology  (+) Blood dyscrasia, anemia ,   Anesthesia Other Findings Past Medical History: No date: Anemia No date: Aortic valve insufficiency No date: Arthritis     Comment:  hands No date: Atherosclerosis of aorta ([place]) No date: Cancer [facility])     Comment:  skin precancerous areas have been removed No date: Carotid stenosis 2010: History of kidney stones No date: Mixed hyperlipidemia No date: Paroxysmal atrial fibrillation ([place]) No date: Venous insufficiency of both lower extremities Past Surgical History: 2019: CARDIAC ELECTROPHYSIOLOGY STUDY AND ABLATION     Comment:  [facility] 2012: EYE SURGERY; Bilateral     Comment:  cataracts 2008: HERNIA REPAIR; Left     Comment:  inguinal 06/24/2016: INGUINAL HERNIA REPAIR; Right     Comment:  Procedure: HERNIA REPAIR INGUINAL ADULT;  Surgeon:               Leonie Green, MD;  Location: [facility] ORS;  Service:               General;  Laterality: Right; 07/16/2020: TOTAL KNEE ARTHROPLASTY; Right     Comment:  Procedure: RIGHT TOTAL KNEE ARTHROPLASTY;  Surgeon:               Thornton Park, MD;  Location: [facility] ORS;  Service:                Orthopedics;  Laterality: Right; 01/20/2020: XI ROBOTIC ASSISTED INGUINAL HERNIA REPAIR WITH MESH;  Bilateral     Comment:  Procedure: XI ROBOTIC ASSISTED INGUINAL HERNIA REPAIR               WITH MESH;  Surgeon: Herbert Pun, MD;                Location: [facility] ORS;  Service: General;  Laterality:               Bilateral; BMI    Body Mass Index: 20.29 kg/m     Reproductive/Obstetrics negative OB ROS                             Anesthesia Physical Anesthesia Plan  ASA: III  Anesthesia Plan: General ETT   Post-op Pain Management:    Induction:   PONV Risk Score and Plan:   Airway Management Planned:   Additional Equipment:   Intra-op Plan:   Post-operative Plan:   Informed Consent: I have reviewed the patients History and Physical, chart, labs and discussed the procedure including the risks, benefits and alternatives for the proposed anesthesia with the patient or authorized representative who has indicated his/her understanding and acceptance.     Dental Advisory Given  Plan Discussed with: CRNA  Anesthesia Plan Comments:         Anesthesia Quick Evaluation

## 2020-12-16 NOTE — Transfer of Care (Signed)
Immediate Anesthesia Transfer of Care Note  Patient: Terry Collins  Procedure(s) Performed: XI ROBOTIC ASSISTED INGUINAL HERNIA REPAIR WITH MESH (Right Groin) INDOCYANINE GREEN FLUORESCENCE IMAGING (ICG)  Patient Location: PACU  Anesthesia Type:General  Level of Consciousness: drowsy and patient cooperative  Airway & Oxygen Therapy: Patient Spontanous Breathing  Post-op Assessment: Report given to RN and Post -op Vital signs reviewed and stable  Post vital signs: Reviewed and stable  Last Vitals:  Vitals Value Taken Time  BP 137/78 12/16/20 1751  Temp 36.3 C 12/16/20 1751  Pulse 84 12/16/20 1759  Resp 9 12/16/20 1759  SpO2 98 % 12/16/20 1759  Vitals shown include unvalidated device data.  Last Pain:  Vitals:   12/16/20 1751  TempSrc:   PainSc: Asleep         Complications: No complications documented.

## 2020-12-16 NOTE — Op Note (Signed)
Preoperative diagnosis: Right recurrent incarcerated inguinal hernia.   Postoperative diagnosis: Right recurrent incarcerated inguinal hernia.  Procedure: Robotic assisted Laparoscopic Transabdominal preperitoneal laparoscopic (TAPP) repair of right recurrent incarcerated inguinal hernia.  Anesthesia: GETA  Surgeon: Dr. Windell Moment  Wound Classification: Clean  Indications:  Patient is a 85 y.o. male developed a recurrent right inguinal hernia and got incarcerated yesterday. Repair was indicated.  Findings: 1. Right recurrent Inguinal hernia identified from the lower border of the mesh   2. Vas deferens and cord structures identified and preserved 3. Bard Extra Large 3D Max mesh used for repair   4. Old mesh left in place with new mesh extending inferiorly   5. Incarcerated colon with adequate ICG green perfusion   6. Adequate hemostasis.   Description of procedure: The patient was taken to the operating room and the correct side of surgery was verified. The patient was placed supine with arms tucked at the sides. After obtaining adequate anesthesia, the patient's abdomen was prepped and draped in standard sterile fashion. The patient was placed in the Trendelenburg position. A time-out was completed verifying correct patient, procedure, site, positioning, and implant(s) and/or special equipment prior to beginning this procedure. A Veress needle was placed at the umbilicus and pneumoperitoneum created with insufflation of carbon dioxide to 15 mmHg. After the Veress needle was removed, an 8-mm trocar was placed on epigastric area and the 30 angled laparoscope inserted. Two 8-mm trocars were then placed lateral to the rectus sheath under direct visualization. Both inguinal regions were inspected and the median umbilical ligament, medial umbilical ligament, and lateral umbilical fold were identified.  The robotic arms were docked. The robotic scope was inserted and the pelvic area  anatomy targeted.  The peritoneum was incised with scissors along a line 2 cm above the superior edge of the previous mesh, extending from the median umbilical ligament to the anterior superior iliac spine. The peritoneal flap was mobilized inferiorly using blunt and sharp dissection. This dissection was done initially above the previous mesh. Midway down the mesh was very adhere to the inferior epigastric and the plane was transitioned to pre peritoneal, now leaving the mesh above the dissection plane. The inferior epigastric vessels were exposed and the pubic symphysis was identified. Cooper's ligament was dissected to its junction with the iliac vein. The dissection was continued inferiorly to the iliopubic tract, with care taken to avoid injury to the femoral branch of the genitofemoral nerve and the lateral femoral cutaneous nerve. The cord structures were parietalized. The hernia was identified and reduced by gentle traction. This was a time consuming dissection and even an extra grasper was needed to be able to dissect the sac away from the cord structures.  The indirect hernia sac was noted mobilized from the cord structures and reduced into the peritoneal cavity.  An additional step was also needed to change the right upper quadrant 8 mm trocar to a 12 mm trocar to be able to fit an extra large 3D max mesh. An extra large piece of mesh was rolled longitudinally into a compact cylinder and passed through a trocar. The cylinder was placed along the inferior aspect of the working space and unrolled into place to completely cover the direct, indirect, and femoral spaces. Vistalseal was irrigated on the myopectorial plane. The mesh was secured into place superiorly to the anterior abdominal wall and inferiorly and medially to Cooper's ligament with absorbable sutures. Care was taken to avoid the inferolateral triangles containing the iliac vessels and  genital nerves. The peritoneal flap was closed over the  mesh and secured with suture in similar positions of safety. An additional step was done to give intravenous ICG green to the patient to assess the perfusion of the incarcerated portion of the cecum. Adequate perfusion was visualized. After ensuring adequate hemostasis, the trocars were removed and the pneumoperitoneum allowed to escape. The trocar incisions were closed using monocryl and skin adhesive dressings applied.  The patient tolerated the procedure well and was taken to the postanesthesia care unit in stable condition.   Specimen: Nonce  Complications: None  Estimated Blood Loss: 20 mL

## 2020-12-16 NOTE — Interval H&P Note (Signed)
History and Physical Interval Note:  12/16/2020 12:26 PM  Terry Collins  has presented today for surgery, with the diagnosis of K40.91 Unilateral recurrent inguinal hernia w/o obstruction or gangrene.  The various methods of treatment have been discussed with the patient and family. After consideration of risks, benefits and other options for treatment, the patient has consented to  Procedure(s): XI ROBOTIC ASSISTED VS Open INGUINAL HERNIA REPAIR WITH MESH (Right) as a surgical intervention.  The patient's history has been reviewed, patient examined, no change in status, stable for surgery.  I have reviewed the patient's chart and labs.  Questions were answered to the patient's satisfaction.     Herbert Pun

## 2020-12-16 NOTE — Anesthesia Procedure Notes (Signed)
Procedure Name: Intubation Performed by: Timoteo Expose, CRNA Pre-anesthesia Checklist: Patient identified, Emergency Drugs available, Suction available and Patient being monitored Patient Re-evaluated:Patient Re-evaluated prior to induction Oxygen Delivery Method: Circle system utilized Preoxygenation: Pre-oxygenation with 100% oxygen Induction Type: IV induction Ventilation: Mask ventilation without difficulty Laryngoscope Size: McGraph and 3 Grade View: Grade I Tube type: Oral Tube size: 7.5 mm Number of attempts: 1 Airway Equipment and Method: Stylet and Oral airway Placement Confirmation: ETT inserted through vocal cords under direct vision,  positive ETCO2 and breath sounds checked- equal and bilateral Secured at: 22 cm Tube secured with: Tape Dental Injury: Teeth and Oropharynx as per pre-operative assessment

## 2020-12-16 NOTE — Consult Note (Signed)
Cardiology Consultation Note    Patient ID: Terry Collins, MRN: [id number], DOB/AGE: 1936/07/10 85 y.o. Admit date: 12/15/2020   Date of Consult: 12/16/2020 Primary Physician: Tracie Harrier, MD Primary Cardiologist: Dr. Nehemiah Massed  Chief Complaint: abdominal pain/incarcerated hernia Reason for Consultation: preop/afib Requesting MD: Dr. Windell Moment  HPI: Terry Collins is a 85 y.o. male with history of paroxysmal atrial fibrillation anticoagulated with Pradaxa and on rhythm control with dofetilide, mild mitral insufficiency, bilateral carotid artery stenosis, moderate aortic insufficiency who was admitted for an incarcerated inguinal hernia.  Will require surgical intervention today.  Cardiology consultation requested for preoperative risk assessment.  Patient was evaluated with a functional study in October 2021 for preop and this showed no ischemia with normal LV function.  Echocardiogram done in October 2021 revealed normal LV function with moderate AI, no AAS, mild MR and mild TR with an estimated RV systolic pressure of 38.1 mmHg.  EKG and March of this year showed sinus rhythm at a rate of 62 with first-degree AV block.  EKG on this presentation showed sinus rhythm with PACs.  CT of the abdomen and pelvis yesterday showed a large right inguinal hernia containing the cecum.  No high-grade bowel obstruction.  Concerning for incarceration.  Laboratories showed a glucose of 126 potassium 3.8 BUN and creatinine of 22 and 1.0 respectively.  White count was 8.2 with a hemoglobin of 12.7.  Platelet count of 194,000.  Patient was on Pradaxa which has been held.  Past Medical History:  Diagnosis Date  . Anemia   . Aortic valve insufficiency   . Arthritis    hands  . Atherosclerosis of aorta ([place])   . Cancer ([place])    skin precancerous areas have been removed  . Carotid stenosis   . History of kidney stones 2010  . Mixed hyperlipidemia   . Paroxysmal atrial fibrillation ([facility])   . Venous  insufficiency of both lower extremities       Surgical History:  Past Surgical History:  Procedure Laterality Date  . CARDIAC ELECTROPHYSIOLOGY STUDY AND ABLATION  2019   [facility]  . EYE SURGERY Bilateral 2012   cataracts  . HERNIA REPAIR Left 2008   inguinal  . INGUINAL HERNIA REPAIR Right 06/24/2016   Procedure: HERNIA REPAIR INGUINAL ADULT;  Surgeon: Leonie Green, MD;  Location: [facility] ORS;  Service: General;  Laterality: Right;  . TOTAL KNEE ARTHROPLASTY Right 07/16/2020   Procedure: RIGHT TOTAL KNEE ARTHROPLASTY;  Surgeon: Thornton Park, MD;  Location: [facility] ORS;  Service: Orthopedics;  Laterality: Right;  . XI ROBOTIC ASSISTED INGUINAL HERNIA REPAIR WITH MESH Bilateral 01/20/2020   Procedure: XI ROBOTIC ASSISTED INGUINAL HERNIA REPAIR WITH MESH;  Surgeon: Herbert Pun, MD;  Location: [facility] ORS;  Service: General;  Laterality: Bilateral;     Home Meds: Prior to Admission medications   Medication Sig Start Date End Date Taking? Authorizing Provider  cycloSPORINE (RESTASIS) 0.05 % ophthalmic emulsion Place 1 drop into both eyes 2 (two) times daily.   Yes [provider]  dabigatran (PRADAXA) 150 MG CAPS capsule Take 150 mg by mouth 2 (two) times daily.   Yes [provider]  dofetilide (TIKOSYN) 125 MCG capsule Take 125 mcg by mouth in the morning and at bedtime. 12/14/19  Yes [provider]  ferrous sulfate 325 (65 FE) MG tablet Take 325 mg by mouth daily with breakfast.    Yes [provider]  folic acid (FOLVITE) 017 MCG tablet Take 800 mcg by mouth daily.  Yes [provider]  metroNIDAZOLE (METROGEL) 1 % gel Apply 1 application topically at bedtime.    Yes [provider]  Multiple Vitamin (MULTIVITAMIN WITH MINERALS) TABS tablet Take 1 tablet by mouth daily.   Yes [provider]  potassium chloride (MICRO-K) 10 MEQ CR capsule Take 10 mEq by mouth daily.    Yes [provider]  vitamin B-12  (CYANOCOBALAMIN) 1000 MCG tablet Take 1,000 mcg by mouth daily.   Yes [provider]    Inpatient Medications:  . cycloSPORINE  1 drop Both Eyes BID  . dofetilide  125 mcg Oral Q12H  . pantoprazole (PROTONIX) IV  40 mg Intravenous QHS  . potassium chloride  10 mEq Oral Daily   . sodium chloride 50 mL/hr at 12/15/20 2258    Allergies:  Allergies  Allergen Reactions  . Lactose Intolerance (Gi) Diarrhea    GI- upset  . Tramadol Other (See Comments)    Confusion     Social History   Socioeconomic History  . Marital status: Widowed    Spouse name: Not on file  . Number of children: Not on file  . Years of education: Not on file  . Highest education level: Not on file  Occupational History  . Not on file  Tobacco Use  . Smoking status: Never Smoker  . Smokeless tobacco: Never Used  Vaping Use  . Vaping Use: Not on file  Substance and Sexual Activity  . Alcohol use: Yes    Alcohol/week: 1.0 standard drink    Types: 1 Glasses of wine per week    Comment: glass of wine week  . Drug use: No  . Sexual activity: Yes  Other Topics Concern  . Not on file  Social History Narrative  . Not on file   Social Determinants of Health   Financial Resource Strain: Not on file  Food Insecurity: Not on file  Transportation Needs: Not on file  Physical Activity: Not on file  Stress: Not on file  Social Connections: Not on file  Intimate Partner Violence: Not on file     Family History  Problem Relation Age of Onset  . Heart attack Father   . Atrial fibrillation Brother      Review of Systems: A 12-system review of systems was performed and is negative except as noted in the HPI.  Labs: No results for input(s): CKTOTAL, CKMB, TROPONINI in the last 72 hours. Lab Results  Component Value Date   WBC 9.0 12/15/2020   HGB 14.3 12/15/2020   HCT 41.9 12/15/2020   MCV 97.7 12/15/2020   PLT 234 12/15/2020    Recent Labs  Lab 12/15/20 1500  NA 140  K 4.0  CL 102   CO2 31  BUN 23  CREATININE 0.88  CALCIUM 9.6  PROT 7.3  BILITOT 0.8  ALKPHOS 99  ALT 18  AST 22  GLUCOSE 125*   No results found for: CHOL, HDL, LDLCALC, TRIG No results found for: DDIMER  Radiology/Studies:  CT ABDOMEN PELVIS W CONTRAST  Result Date: 12/15/2020 CLINICAL DATA:  Severe right inguinal pain extending into the abdomen today. Schedule for hernia surgery in 2 weeks. EXAM: CT ABDOMEN AND PELVIS WITH CONTRAST TECHNIQUE: Multidetector CT imaging of the abdomen and pelvis was performed using the standard protocol following bolus administration of intravenous contrast. CONTRAST:  156mL OMNIPAQUE IOHEXOL 300 MG/ML  SOLN COMPARISON:  Pelvic CT 10/04/2019 FINDINGS: Lower chest: Dependent atelectasis at both lung bases. There is no pleural  or pericardial effusion. The heart is enlarged and exaggerated by the presence of a prominent pectus deformity of the sternum. Hepatobiliary: The liver is normal in density without suspicious focal abnormality. Lateral position of the gallbladder without wall thickening, gallstones or significant biliary dilatation. Pancreas: The pancreas appears atrophied without focal abnormality, ductal dilatation or surrounding inflammation. Spleen: 10 mm low-density lesion medially on image 18/2, unlikely to be clinically significant. Otherwise unremarkable. Adrenals/Urinary Tract: Indeterminate left adrenal nodule measuring 2.4 x 1.7 cm on image 27/2. This measures 47 HU on the immediate postcontrast images and 41 HU on the delayed post-contrast images. The right adrenal gland appears normal. There are large renal cysts bilaterally, measuring up to 9.7 cm in the lower pole of the right kidney. No evidence of renal mass, urinary tract calculus or hydronephrosis. The bladder appears unremarkable. Stomach/Bowel: No enteric contrast was administered. The stomach is incompletely distended and demonstrates atypical luminal constriction and wall thickening along the greater  curvature. The proximal small bowel appears normal. There is mild dilatation of the distal small bowel with scattered air-fluid levels. There has been interval development of a large right inguinal hernia which contains the cecum and a large amount of stool. There is surrounding fluid in the hernia sac and mild proximal colonic wall thickening which could indicate incarceration. The appendix is no longer clearly seen. The distal colon appears normal. Vascular/Lymphatic: There are no enlarged abdominal or pelvic lymph nodes. Aortic and branch vessel atherosclerosis without acute vascular findings. Reproductive: The prostate gland appears stable with median lobe hypertrophy and postsurgical changes related to previous brachytherapy implants or UroLift procedure. Other: Small amount of perihepatic ascites. No free air or focal extraluminal fluid collection. Stable large linear calcification within the small bowel mesentery, measuring up to 3.4 cm in length on coronal image 23/5. This was less evident on previous pelvic CT due to the presence of surrounding enteric contrast. Musculoskeletal: No acute or significant osseous findings. There are degenerative changes throughout the spine and asymmetric right hip arthropathy. IMPRESSION: 1. Interval development of a large right inguinal hernia containing the cecum. There is associated right colonic wall thickening and surrounding fluid which may indicate incarceration. No high-grade bowel obstruction. 2. Atypical constriction of the gastric lumen with wall thickening along the greater curvature, not previously imaged. 3. Indeterminate left adrenal nodule, not previously imaged. This could be further characterized with follow-up noncontrast CT within 6 months. 4. Incidental findings including a prominent pectus deformity of the sternum, multiple large bilateral renal cysts, postsurgical changes in the prostate gland, small-bowel mesenteric calcification and Aortic  Atherosclerosis (ICD10-I70.0). Electronically Signed   By: Richardean Sale M.D.   On: 12/15/2020 16:33    Wt Readings from Last 3 Encounters:  12/15/20 71.7 kg  07/16/20 73.5 kg  01/20/20 72.6 kg    EKG: nsr with pacs  Physical Exam:  Blood pressure 122/80, pulse 61, temperature 97.8 F (36.6 C), temperature source Oral, resp. rate 18, height 6\' 2"  (1.88 m), weight 71.7 kg, SpO2 97 %. Body mass index is 20.29 kg/m. General: Well developed, well nourished, in no acute distress. Head: Normocephalic, atraumatic, sclera non-icteric, no xanthomas, nares are without discharge.  Neck: Negative for carotid bruits. JVD not elevated. Lungs: Clear bilaterally to auscultation without wheezes, rales, or rhonchi. Breathing is unlabored. Heart: RRR with S1 S2. No murmurs, rubs, or gallops appreciated. Abdomen: Soft, non-tender, non-distended with normoactive bowel sounds. No hepatomegaly. No rebound/guarding. No obvious abdominal masses. Msk:  Strength and tone appear normal for  age. Extremities: No clubbing or cyanosis. No edema.  Distal pedal pulses are 2+ and equal bilaterally. Neuro: Alert and oriented X 3. No facial asymmetry. No focal deficit. Moves all extremities spontaneously. Psych:  Responds to questions appropriately with a normal affect.     Assessment and Plan   85 y.o. male with history of paroxysmal atrial fibrillation anticoagulated with Pradaxa and on rhythm control with dofetilide, mild mitral insufficiency, bilateral carotid artery stenosis, moderate aortic insufficiency who was admitted for an incarcerated inguinal hernia.  Will require surgical intervention today.  Cardiology consultation requested for preoperative risk assessment.  Patient was evaluated with a functional study in October 2021 for preop and this showed no ischemia with normal LV function.  Echocardiogram done in October 2021 revealed normal LV function with moderate AI, no AAS, mild MR and mild TR with an  estimated RV systolic pressure of 28.7 mmHg.  EKG and March of this year showed sinus rhythm at a rate of 62 with first-degree AV block.  EKG on this presentation showed sinus rhythm with PACs.  CT of the abdomen and pelvis yesterday showed a large right inguinal hernia containing the cecum.  No high-grade bowel obstruction.  Concerning for incarceration.  Laboratories showed a glucose of 126 potassium 3.8 BUN and creatinine of 22 and 1.0 respectively.  White count was 8.2 with a hemoglobin of 12.7.  Platelet count of 194,000.  Patient was on Pradaxa which has been held.  1.  A. fib-patient appears to be in sinus rhythm on dofetilide.  We will continue with dofetilide as you are doing.  Okay to hold Pradaxa until postop.  Would resume Pradaxa soon as possible postoperatively.  Would continue to the remainder of his medications.  He had a negative functional study in October prior to knee surgery and did well with surgery.  It appears she is optimized from a cardiac standpoint at low risk for proceeding with surgery.  We will proceed without further cardiac evaluation.  Signed, Teodoro Spray MD 12/16/2020, 7:55 AM Pager: [phone]

## 2020-12-17 ENCOUNTER — Ambulatory Visit: Admission: RE | Admit: 2020-12-17 | Payer: Medicare Other | Source: Ambulatory Visit

## 2020-12-17 ENCOUNTER — Encounter: Payer: Self-pay | Admitting: General Surgery

## 2020-12-17 LAB — MAGNESIUM: Magnesium: 1.9 mg/dL (ref 1.7–2.4)

## 2020-12-17 LAB — BASIC METABOLIC PANEL
Anion gap: 8 (ref 5–15)
BUN: 19 mg/dL (ref 8–23)
CO2: 29 mmol/L (ref 22–32)
Calcium: 9 mg/dL (ref 8.9–10.3)
Chloride: 103 mmol/L (ref 98–111)
Creatinine, Ser: 0.96 mg/dL (ref 0.61–1.24)
GFR, Estimated: >60 mL/min (ref >60)
Glucose, Bld: 102 mg/dL — ABNORMAL HIGH (ref 70–99)
Potassium: 4.1 mmol/L (ref 3.5–5.1)
Sodium: 140 mmol/L (ref 135–145)

## 2020-12-17 MED ORDER — DABIGATRAN ETEXILATE MESYLATE 150 MG PO CAPS
150.0000 mg | ORAL_CAPSULE | Freq: Two times a day (BID) | ORAL | Status: DC
  Administered 2020-12-17 – 2020-12-18 (×2): 150 mg via ORAL
  Filled 2020-12-17 (×3): qty 1

## 2020-12-17 NOTE — Progress Notes (Signed)
Mobility Specialist - Progress Note   12/17/20 1500  Mobility  Activity Ambulated in hall  Level of Assistance Contact guard assist, steadying assist  [place] wheel walker  Distance Ambulated (ft) 180 ft  Mobility Response Tolerated well  Mobility performed by Mobility specialist  $Mobility charge 1 Mobility    Pt ambulated in hallway with RW and CGA for steadying. Slow, shuffled gait. No LOB. Denied SOB, O2 98% on RA. MinA and extra time to stand from recliner. No reports of pain at incision site from activity. Pt left in chair, motivated to ambulate again this evening.    Kathee Delton Mobility Specialist 12/17/20, 3:40 PM

## 2020-12-17 NOTE — Progress Notes (Signed)
Patient ID: Terry Collins, male   DOB: Jun 13, 1936, 85 y.o.   MRN: [id number]     [place] Hospital Day(s): 2.   Post op day(s): 1 Day Post-Op.   Interval History: Patient seen and examined, no acute events or new complaints overnight. Patient reports feeling better but still with pain specially when trying to stand up from bed or chair. Denies nausea or vomiting.   Vital signs in last 24 hours: [min-max] current  Temp:  [97.3 F (36.3 C)-98.1 F (36.7 C)] 98 F (36.7 C) (04/21 1120) Pulse Rate:  [62-89] 85 (04/21 1120) Resp:  [10-18] 18 (04/21 1120) BP: (116-146)/(70-89) 126/74 (04/21 1120) SpO2:  [95 %-100 %] 99 % (04/21 1120)     Height: 6\' 2"  (188 cm) Weight: 71.7 kg BMI (Calculated): 20.29   Physical Exam:  Constitutional: alert, cooperative and no distress  Respiratory: breathing non-labored at rest  Cardiovascular: regular rate and sinus rhythm  Gastrointestinal: soft, non-tender, and non-distended. Wounds are dry and clean. Minimal swelling of the right groin.   Labs:  CBC Latest Ref Rng & Units 12/15/2020 07/20/2020 07/18/2020  WBC 4.0 - 10.5 K/uL 9.0 10.8(H) 11.5(H)  Hemoglobin 13.0 - 17.0 g/dL 14.3 10.2(L) 10.4(L)  Hematocrit 39.0 - 52.0 % 41.9 30.7(L) 29.9(L)  Platelets 150 - 400 K/uL 234 182 149(L)   CMP Latest Ref Rng & Units 12/17/2020 12/15/2020 07/21/2020  Glucose 70 - 99 mg/dL 102(H) 125(H) -  BUN 8 - 23 mg/dL 19 23 -  Creatinine 0.61 - 1.24 mg/dL 0.96 0.88 0.77  Sodium 135 - 145 mmol/L 140 140 -  Potassium 3.5 - 5.1 mmol/L 4.1 4.0 3.5  Chloride 98 - 111 mmol/L 103 102 -  CO2 22 - 32 mmol/L 29 31 -  Calcium 8.9 - 10.3 mg/dL 9.0 9.6 -  Total Protein 6.5 - 8.1 g/dL - 7.3 -  Total Bilirubin 0.3 - 1.2 mg/dL - 0.8 -  Alkaline Phos 38 - 126 U/L - 99 -  AST 15 - 41 U/L - 22 -  ALT 0 - 44 U/L - 18 -    Imaging studies: No new pertinent imaging studies   Assessment/Plan:  85 y.o. male with incarcerated right inguinal hernia 1 Day Post-Op s/p  robotic assisted laparoscopic recurrent right inguinal hernia, complicated by pertinent comorbidities including a fib on anticoagulation.  Patient recovering slowly but adequately. Still with significant pain. No sign of complication at this moment. Will re evaluate tomorrow morning. Encourage to ambulate. Will restart anticoagulation as taken at home. Will discontinue IVFs.   Arnold Long, MD

## 2020-12-18 NOTE — Plan of Care (Signed)
  Problem: Education: Goal: Knowledge of General Education information will improve Description: Including pain rating scale, medication(s)/side effects and non-pharmacologic comfort measures 12/18/2020 1034 by Vivien Rota, RN Outcome: Adequate for Discharge 12/18/2020 0733 by Vivien Rota, RN Outcome: Progressing   Problem: Health Behavior/Discharge Planning: Goal: Ability to manage health-related needs will improve 12/18/2020 1034 by Vivien Rota, RN Outcome: Adequate for Discharge 12/18/2020 0733 by Vivien Rota, RN Outcome: Progressing   Problem: Clinical Measurements: Goal: Ability to maintain clinical measurements within normal limits will improve 12/18/2020 1034 by Vivien Rota, RN Outcome: Adequate for Discharge 12/18/2020 0733 by Vivien Rota, RN Outcome: Progressing Goal: Will remain free from infection 12/18/2020 1034 by Vivien Rota, RN Outcome: Adequate for Discharge 12/18/2020 0733 by Vivien Rota, RN Outcome: Progressing Goal: Diagnostic test results will improve 12/18/2020 1034 by Vivien Rota, RN Outcome: Adequate for Discharge 12/18/2020 4008 by Vivien Rota, RN Outcome: Progressing Goal: Respiratory complications will improve 12/18/2020 1034 by Vivien Rota, RN Outcome: Adequate for Discharge 12/18/2020 6761 by Vivien Rota, RN Outcome: Progressing Goal: Cardiovascular complication will be avoided 12/18/2020 1034 by Vivien Rota, RN Outcome: Adequate for Discharge 12/18/2020 9509 by Vivien Rota, RN Outcome: Progressing   Problem: Activity: Goal: Risk for activity intolerance will decrease 12/18/2020 1034 by Vivien Rota, RN Outcome: Adequate for Discharge 12/18/2020 0733 by Vivien Rota, RN Outcome: Progressing   Problem: Nutrition: Goal: Adequate nutrition will be maintained 12/18/2020 1034 by Vivien Rota, RN Outcome:  Adequate for Discharge 12/18/2020 0733 by Vivien Rota, RN Outcome: Progressing   Problem: Coping: Goal: Level of anxiety will decrease 12/18/2020 1034 by Vivien Rota, RN Outcome: Adequate for Discharge 12/18/2020 0733 by Vivien Rota, RN Outcome: Progressing   Problem: Elimination: Goal: Will not experience complications related to bowel motility 12/18/2020 1034 by Vivien Rota, RN Outcome: Adequate for Discharge 12/18/2020 0733 by Vivien Rota, RN Outcome: Progressing Goal: Will not experience complications related to urinary retention 12/18/2020 1034 by Vivien Rota, RN Outcome: Adequate for Discharge 12/18/2020 0733 by Vivien Rota, RN Outcome: Progressing   Problem: Pain Managment: Goal: General experience of comfort will improve 12/18/2020 1034 by Vivien Rota, RN Outcome: Adequate for Discharge 12/18/2020 3267 by Vivien Rota, RN Outcome: Progressing   Problem: Safety: Goal: Ability to remain free from injury will improve 12/18/2020 1034 by Vivien Rota, RN Outcome: Adequate for Discharge 12/18/2020 1245 by Vivien Rota, RN Outcome: Progressing   Problem: Skin Integrity: Goal: Risk for impaired skin integrity will decrease 12/18/2020 1034 by Vivien Rota, RN Outcome: Adequate for Discharge 12/18/2020 8099 by Vivien Rota, RN Outcome: Progressing

## 2020-12-18 NOTE — Discharge Summary (Signed)
  Patient ID: Terry Collins MRN: [id number] DOB/AGE: [birth year] 83 y.o.  Admit date: 12/15/2020 Discharge date: 12/18/2020   Discharge Diagnoses:  Active Problems:   Incarcerated inguinal hernia, unilateral   Procedures: Robotic assisted laparoscopic repair of recurrent right inguinal hernia.  Hospital Course: Patient developed an incarcerated recurrent right inguinal hernia.  Due to worsening pain he came to the emergency room.  He was admitted for surgical management.  Patient underwent repair of the recurrent right inguinal hernia repair.  He tolerated the procedure well.  Today he is ambulating, tolerating diet, having bowel movements.  The pain is controlled.  There is no swelling or bruise.  No sign of bleeding.  Physical Exam Cardiovascular:     Rate and Rhythm: Normal rate.  Pulmonary:     Effort: Pulmonary effort is normal.     Breath sounds: Normal breath sounds.  Abdominal:     General: Abdomen is flat. Bowel sounds are normal.     Palpations: Abdomen is soft.     Hernia: There is no hernia in the left inguinal area or right inguinal area.  Genitourinary:    Penis: Normal.      Testes: Normal.  Neurological:     Mental Status: He is alert.   Wounds are dry and clean.    Consults: Cardiology  Disposition: Discharge disposition: 01-Home or Self Care       Discharge Instructions    Diet - low sodium heart healthy   Complete by: As directed    Increase activity slowly   Complete by: As directed      Allergies as of 12/18/2020      Reactions   Lactose Intolerance (gi) Diarrhea   GI- upset   Tramadol Other (See Comments)   Confusion       Medication List    TAKE these medications   cycloSPORINE 0.05 % ophthalmic emulsion Commonly known as: RESTASIS Place 1 drop into both eyes 2 (two) times daily.   dabigatran 150 MG Caps capsule Commonly known as: PRADAXA Take 150 mg by mouth 2 (two) times daily.   dofetilide 125 MCG capsule Commonly known as:  TIKOSYN Take 125 mcg by mouth in the morning and at bedtime.   ferrous sulfate 325 (65 FE) MG tablet Take 325 mg by mouth daily with breakfast.   folic acid 863 MCG tablet Commonly known as: FOLVITE Take 800 mcg by mouth daily.   metroNIDAZOLE 1 % gel Commonly known as: METROGEL Apply 1 application topically at bedtime.   multivitamin with minerals Tabs tablet Take 1 tablet by mouth daily.   potassium chloride 10 MEQ CR capsule Commonly known as: MICRO-K Take 10 mEq by mouth daily.   vitamin B-12 1000 MCG tablet Commonly known as: CYANOCOBALAMIN Take 1,000 mcg by mouth daily.       Follow-up Information    Herbert Pun, MD Follow up in 2 week(s).   Specialty: General Surgery Contact information: [address] [place] [address] [phone]

## 2020-12-18 NOTE — Care Management Important Message (Signed)
Important Message  Patient Details  Name: Kalix Meinecke MRN: [id number] Date of Birth: 24-Dec-1935   Medicare Important Message Given:  Yes     [name] 12/18/2020, 11:53 AM

## 2020-12-18 NOTE — Progress Notes (Signed)
Reviewed discharge instructions and verbalized understanding.

## 2020-12-18 NOTE — Plan of Care (Signed)

## 2020-12-18 NOTE — Discharge Instructions (Signed)

## 2020-12-21 ENCOUNTER — Inpatient Hospital Stay
Admission: RE | Admit: 2020-12-21 | Discharge: 2020-12-21 | Disposition: A | Discharge status: Home / Self Care | Payer: Medicare Other | Source: Ambulatory Visit

## 2021-07-06 MED ORDER — SODIUM CHLORIDE 0.9 % IV SOLN: INTRAVENOUS | Status: DC

## 2021-07-07 ENCOUNTER — Other Ambulatory Visit: Payer: Self-pay

## 2021-07-07 ENCOUNTER — Encounter
Admission: RE | Disposition: A | Discharge status: Home / Self Care | Payer: Self-pay | Source: Home / Self Care | Attending: Internal Medicine

## 2021-07-07 ENCOUNTER — Encounter: Payer: Self-pay | Admitting: Internal Medicine

## 2021-07-07 ENCOUNTER — Ambulatory Visit: Payer: Medicare Other | Admitting: Anesthesiology

## 2021-07-07 ENCOUNTER — Ambulatory Visit
Admission: RE | Admit: 2021-07-07 | Discharge: 2021-07-07 | Disposition: A | Discharge status: Home / Self Care | Payer: Medicare Other | Attending: Internal Medicine | Admitting: Internal Medicine

## 2021-07-07 DIAGNOSIS — E782 Mixed hyperlipidemia: Secondary | ICD-10-CM | POA: Diagnosis not present

## 2021-07-07 DIAGNOSIS — I6523 Occlusion and stenosis of bilateral carotid arteries: Secondary | ICD-10-CM | POA: Insufficient documentation

## 2021-07-07 DIAGNOSIS — Z79899 Other long term (current) drug therapy: Secondary | ICD-10-CM | POA: Diagnosis not present

## 2021-07-07 DIAGNOSIS — Z7901 Long term (current) use of anticoagulants: Secondary | ICD-10-CM | POA: Diagnosis not present

## 2021-07-07 DIAGNOSIS — I452 Bifascicular block: Secondary | ICD-10-CM | POA: Diagnosis not present

## 2021-07-07 DIAGNOSIS — I351 Nonrheumatic aortic (valve) insufficiency: Secondary | ICD-10-CM | POA: Diagnosis not present

## 2021-07-07 DIAGNOSIS — I700 Atherosclerosis of aorta: Secondary | ICD-10-CM | POA: Diagnosis not present

## 2021-07-07 DIAGNOSIS — I10 Essential (primary) hypertension: Secondary | ICD-10-CM | POA: Diagnosis not present

## 2021-07-07 DIAGNOSIS — I480 Paroxysmal atrial fibrillation: Secondary | ICD-10-CM | POA: Insufficient documentation

## 2021-07-07 HISTORY — PX: CARDIOVERSION: SHX1299

## 2021-07-07 SURGERY — CARDIOVERSION
Anesthesia: General

## 2021-07-07 MED ORDER — FENTANYL CITRATE (PF) 100 MCG/2ML IJ SOLN: 25.0000 ug | INTRAMUSCULAR | Status: DC | PRN

## 2021-07-07 MED ORDER — PROPOFOL 10 MG/ML IV BOLUS
INTRAVENOUS | Status: DC | PRN
  Administered 2021-07-07: 10 mg via INTRAVENOUS
  Administered 2021-07-07: 50 mg via INTRAVENOUS

## 2021-07-07 MED ORDER — PROPOFOL 500 MG/50ML IV EMUL
INTRAVENOUS | Status: AC
  Filled 2021-07-07: qty 50

## 2021-07-07 MED ORDER — ONDANSETRON HCL 4 MG/2ML IJ SOLN: 4.0000 mg | Freq: Once | INTRAMUSCULAR | Status: DC | PRN

## 2021-07-07 NOTE — CV Procedure (Signed)
Electrical Cardioversion Procedure Note Terry Collins [id number] 09-05-1935  Procedure: Electrical Cardioversion Indications:  Paroxysmal non valvular atrial fibrillation  Procedure Details Consent: Risks of procedure as well as the alternatives and risks of each were explained to the (patient/caregiver).  Consent for procedure obtained. Time Out: Verified patient identification, verified procedure, site/side was marked, verified correct patient position, special equipment/implants available, medications/allergies/relevent history reviewed, required imaging and test results available.  Performed  Patient placed on cardiac monitor, pulse oximetry, supplemental oxygen as necessary.  Sedation given: Propofol and versed as per anesthesia  Pacer pads placed anterior and posterior chest.  Cardioverted 1 time(s).  Cardioverted at 120J.  Evaluation Findings: Post procedure EKG shows: NSR Complications: None Patient did tolerate procedure well.   Terry Collins M.D. [facility] 07/07/2021, 8:17 AM

## 2021-07-07 NOTE — Anesthesia Preprocedure Evaluation (Signed)
Anesthesia Evaluation  Patient identified by MRN, date of birth, ID band Patient awake    Reviewed: Allergy & Precautions, H&P , NPO status , Patient's Chart, lab work & pertinent test results, reviewed documented beta blocker date and time   Airway Mallampati: II   Neck ROM: full    Dental  (+) Teeth Intact   Pulmonary neg pulmonary ROS,    Pulmonary exam normal        Cardiovascular Exercise Tolerance: Poor negative cardio ROS  Atrial Fibrillation  Rhythm:regular Rate:Normal     Neuro/Psych negative neurological ROS  negative psych ROS   GI/Hepatic negative GI ROS, Neg liver ROS,   Endo/Other  negative endocrine ROS  Renal/GU negative Renal ROS  negative genitourinary   Musculoskeletal   Abdominal   Peds  Hematology  (+) Blood dyscrasia, anemia ,   Anesthesia Other Findings Past Medical History: No date: Anemia No date: Aortic valve insufficiency No date: Arthritis     Comment:  hands No date: Atherosclerosis of aorta ([place]) No date: Cancer [facility])     Comment:  skin precancerous areas have been removed No date: Carotid stenosis 2010: History of kidney stones No date: Mixed hyperlipidemia No date: Paroxysmal atrial fibrillation ([place]) No date: Venous insufficiency of both lower extremities Past Surgical History: 2019: CARDIAC ELECTROPHYSIOLOGY STUDY AND ABLATION     Comment:  [facility] 2012: EYE SURGERY; Bilateral     Comment:  cataracts 2008: HERNIA REPAIR; Left     Comment:  inguinal 06/24/2016: INGUINAL HERNIA REPAIR; Right     Comment:  Procedure: HERNIA REPAIR INGUINAL ADULT;  Surgeon:               Leonie Green, MD;  Location: [facility] ORS;  Service:               General;  Laterality: Right; 07/16/2020: TOTAL KNEE ARTHROPLASTY; Right     Comment:  Procedure: RIGHT TOTAL KNEE ARTHROPLASTY;  Surgeon:               Thornton Park, MD;  Location: [facility] ORS;  Service:               Orthopedics;   Laterality: Right; 01/20/2020: XI ROBOTIC ASSISTED INGUINAL HERNIA REPAIR WITH MESH;  Bilateral     Comment:  Procedure: XI ROBOTIC ASSISTED INGUINAL HERNIA REPAIR               WITH MESH;  Surgeon: Herbert Pun, MD;                Location: [facility] ORS;  Service: General;  Laterality:               Bilateral; 12/16/2020: XI ROBOTIC ASSISTED INGUINAL HERNIA REPAIR WITH MESH; Right     Comment:  Procedure: XI ROBOTIC ASSISTED INGUINAL HERNIA REPAIR               WITH MESH;  Surgeon: Herbert Pun, MD;                Location: [facility] ORS;  Service: General;  Laterality:               Right;   Reproductive/Obstetrics negative OB ROS                             Anesthesia Physical Anesthesia Plan  ASA: 4  Anesthesia Plan: General   Post-op Pain Management:    Induction:   PONV Risk  Score and Plan:   Airway Management Planned:   Additional Equipment:   Intra-op Plan:   Post-operative Plan:   Informed Consent: I have reviewed the patients History and Physical, chart, labs and discussed the procedure including the risks, benefits and alternatives for the proposed anesthesia with the patient or authorized representative who has indicated his/her understanding and acceptance.     Dental Advisory Given  Plan Discussed with: CRNA  Anesthesia Plan Comments:         Anesthesia Quick Evaluation

## 2021-07-07 NOTE — Transfer of Care (Signed)
Immediate Anesthesia Transfer of Care Note  Patient: Terry Collins  Procedure(s) Performed: CARDIOVERSION  Patient Location: PACU  Anesthesia Type:General  Level of Consciousness: drowsy and patient cooperative  Airway & Oxygen Therapy: Patient Spontanous Breathing and Patient connected to nasal cannula oxygen  Post-op Assessment: Report given to RN and Post -op Vital signs reviewed and stable  Post vital signs: Reviewed and stable  Last Vitals:  Vitals Value Taken Time  BP 132/73 07/07/21 0811  Temp    Pulse 73 07/07/21 0811  Resp 11 07/07/21 0811  SpO2 100 % 07/07/21 0811  Vitals shown include unvalidated device data.  Last Pain:  Vitals:   07/07/21 0727  TempSrc: Oral  PainSc: 0-No pain         Complications: No notable events documented.

## 2021-07-08 ENCOUNTER — Encounter: Payer: Self-pay | Admitting: Internal Medicine

## 2021-07-08 NOTE — Anesthesia Postprocedure Evaluation (Signed)
Anesthesia Post Note  Patient: Terry Collins  Procedure(s) Performed: CARDIOVERSION  Patient location during evaluation: PACU Anesthesia Type: General Level of consciousness: awake and alert Pain management: pain level controlled Vital Signs Assessment: post-procedure vital signs reviewed and stable Respiratory status: spontaneous breathing, nonlabored ventilation, respiratory function stable and patient connected to nasal cannula oxygen Cardiovascular status: blood pressure returned to baseline and stable Postop Assessment: no apparent nausea or vomiting Anesthetic complications: no   No notable events documented.   Last Vitals:  Vitals:   07/07/21 0830 07/07/21 0845  BP: 129/76 (!) 111/50  Pulse: (!) 59 68  Resp: 13 15  Temp:    SpO2: 99% 98%    Last Pain:  Vitals:   07/07/21 0727  TempSrc: Oral  PainSc: 0-No pain                 Molli Barrows

## 2021-07-15 ENCOUNTER — Emergency Department
Admission: EM | Admit: 2021-07-15 | Discharge: 2021-07-15 | Disposition: A | Discharge status: Home / Self Care | Payer: Medicare Other | Attending: Emergency Medicine | Admitting: Emergency Medicine

## 2021-07-15 ENCOUNTER — Other Ambulatory Visit: Payer: Self-pay

## 2021-07-15 ENCOUNTER — Emergency Department: Payer: Medicare Other

## 2021-07-15 DIAGNOSIS — Z96651 Presence of right artificial knee joint: Secondary | ICD-10-CM | POA: Diagnosis not present

## 2021-07-15 DIAGNOSIS — M545 Low back pain, unspecified: Secondary | ICD-10-CM | POA: Diagnosis present

## 2021-07-15 MED ORDER — HYDROCODONE-ACETAMINOPHEN 5-325 MG PO TABS: 1.0000 | ORAL_TABLET | Freq: Four times a day (QID) | ORAL | 0 refills | Status: DC | PRN

## 2021-07-15 NOTE — ED Triage Notes (Signed)
Pt here via [facility] from [facility] from lower back pain. Pt states that he has a hx of back issues but this time he was not able to stand today. Pt in NAD in triage.

## 2021-07-15 NOTE — ED Triage Notes (Signed)
Pt to ED via [facility] from [facility] with c/o lower left back pain. No known injury. Pt went to chiropractor for the same 2 weeks ago and they fixed him, he tried to get an appointment but was not able to get an appointment.

## 2021-07-15 NOTE — ED Notes (Signed)
See triage note  presents with back pain  states he was putting groceries away and developed pain   states he could not stand up straight  pt was able to get up from w/c and walk to stretcher w/o pain

## 2021-07-15 NOTE — ED Provider Notes (Signed)
[facility] Emergency Department Provider Note    ____________________________________________   Event Date/Time   First MD Initiated Contact with Patient 07/15/21 1652     (approximate)  I have reviewed the triage vital signs and the nursing notes.   HISTORY  Chief Complaint Back Pain   HPI Terry Collins is a 85 y.o. male, history of arthritis, aortic valve insufficiency, and atrial fibrillation, presents the emergency department for evaluation of back pain.  Patient states that his back pain began when he was putting groceries away today when he felt a sudden onset of pain on the lower left side of his back.  Describes the pain as sharp, 6/10, and exacerbated while standing upright.  He states that he usually has chronic low back pain on his right side, which she regularly sees a chiropractor for, but has never felt the same sensation in the left side until now.  Denies fever/chills, saddle anesthesia, dysuria, bowel incontinence, numbness/tingling in the upper or lower extremities, abdominal pain, or chest pain.   History limited by: None.  History is corroborated by his son who is with the patient in the room.  Past Medical History:  Diagnosis Date   Anemia    Aortic valve insufficiency    Arthritis    hands   Atherosclerosis of aorta ([facility])    Carotid stenosis    History of kidney stones 2010   Mixed hyperlipidemia    Paroxysmal atrial fibrillation ([facility])    Venous insufficiency of both lower extremities     Patient Active Problem List   Diagnosis Date Noted   Incarcerated inguinal hernia, unilateral 12/15/2020   Paroxysmal atrial fibrillation ([facility])    Leukocytosis    Acute metabolic encephalopathy    Normocytic anemia    S/P TKR (total knee replacement) using cement, right    Total knee replacement status, right 07/16/2020    Past Surgical History:  Procedure Laterality Date   CARDIAC ELECTROPHYSIOLOGY STUDY AND ABLATION  2019   [facility]    CARDIOVERSION N/A 07/07/2021   Procedure: CARDIOVERSION;  Surgeon: Corey Skains, MD;  Location: [facility] ORS;  Service: Cardiovascular;  Laterality: N/A;   cardiovesion     EYE SURGERY Bilateral 2012   cataracts   HERNIA REPAIR Left 2008   inguinal   INGUINAL HERNIA REPAIR Right 06/24/2016   Procedure: HERNIA REPAIR INGUINAL ADULT;  Surgeon: Leonie Green, MD;  Location: [facility] ORS;  Service: General;  Laterality: Right;   TOTAL KNEE ARTHROPLASTY Right 07/16/2020   Procedure: RIGHT TOTAL KNEE ARTHROPLASTY;  Surgeon: Thornton Park, MD;  Location: [facility] ORS;  Service: Orthopedics;  Laterality: Right;   XI ROBOTIC ASSISTED INGUINAL HERNIA REPAIR WITH MESH Bilateral 01/20/2020   Procedure: XI ROBOTIC ASSISTED INGUINAL HERNIA REPAIR WITH MESH;  Surgeon: Herbert Pun, MD;  Location: [facility] ORS;  Service: General;  Laterality: Bilateral;   XI ROBOTIC ASSISTED INGUINAL HERNIA REPAIR WITH MESH Right 12/16/2020   Procedure: XI ROBOTIC ASSISTED INGUINAL HERNIA REPAIR WITH MESH;  Surgeon: Herbert Pun, MD;  Location: [facility] ORS;  Service: General;  Laterality: Right;    Prior to Admission medications   Medication Sig Start Date End Date Taking? Authorizing Provider  HYDROcodone-acetaminophen (NORCO/VICODIN) 5-325 MG tablet Take 1 tablet by mouth every 6 (six) hours as needed for up to 10 doses for moderate pain. 07/15/21  Yes Teodoro Spray, PA  Calcium Polycarbophil (FIBER-CAPS PO) Take 3 capsules by mouth in the morning.    [provider]  cycloSPORINE (RESTASIS)  0.05 % ophthalmic emulsion Place 1 drop into both eyes 2 (two) times daily.    [provider]  dabigatran (PRADAXA) 150 MG CAPS capsule Take 150 mg by mouth 2 (two) times daily.    [provider]  dofetilide (TIKOSYN) 125 MCG capsule Take 125 mcg by mouth in the morning and at bedtime. 12/14/19   [provider]  ferrous sulfate 325 (65 FE) MG tablet Take 325 mg by mouth in the  morning.    [provider]  folic acid (FOLVITE) 921 MCG tablet Take 800 mcg by mouth in the morning.    [provider]  metroNIDAZOLE (METROGEL) 1 % gel Apply 1 application topically at bedtime.     [provider]  Multiple Vitamin (MULTIVITAMIN WITH MINERALS) TABS tablet Take 1 tablet by mouth in the morning. Centrum Silver    [provider]  potassium chloride (MICRO-K) 10 MEQ CR capsule Take 10 mEq by mouth in the morning.    [provider]  vitamin B-12 (CYANOCOBALAMIN) 1000 MCG tablet Take 1,000 mcg by mouth in the morning.    [provider]    Allergies Lactose intolerance (gi) and Tramadol  Family History  Problem Relation Age of Onset   Heart attack Father    Atrial fibrillation Brother     Social History Social History   Tobacco Use   Smoking status: Never   Smokeless tobacco: Never  Substance Use Topics   Alcohol use: Yes    Alcohol/week: 1.0 standard drink    Types: 1 Glasses of wine per week    Comment: glass of wine week   Drug use: No    Review of Systems  Constitutional: Negative for fever/chills, weight loss, or fatigue.  Eyes: Negative for visual changes or discharge.  ENT: Negative for congestion, hearing changes, or sore throat.  Gastrointestinal: Negative for abdominal pain, nausea/vomiting, or diarrhea.  Genitourinary: Negative for dysuria or hematuria.  Musculoskeletal: Positive for back pain   Skin: Negative for rashes or lesions.  Neurological: Negative for headache, syncope, dizziness, tremors, or numbness/tingling.   10-point ROS otherwise negative. ____________________________________________   PHYSICAL EXAM:  VITAL SIGNS: ED Triage Vitals  Enc Vitals Group     BP 07/15/21 1542 (!) 147/96     Pulse Rate 07/15/21 1540 66     Resp 07/15/21 1540 18     Temp 07/15/21 1540 97.9 F (36.6 C)     Temp Source 07/15/21 1540 Oral     SpO2 07/15/21 1540 97 %     Weight 07/15/21 1541  170 lb (77.1 kg)     Height 07/15/21 1541 6\' 2"  (1.88 m)     Head Circumference --      Peak Flow --      Pain Score --      Pain Loc --      Pain Edu? --      Excl. in [place]? --     Physical Exam Constitutional:      General: He is not in acute distress.    Appearance: Normal appearance. He is not ill-appearing.  HENT:     Head: Normocephalic and atraumatic.     Nose: Nose normal.     Mouth/Throat:     Mouth: Mucous membranes are moist.     Pharynx: Oropharynx is clear.  Eyes:     Extraocular Movements: Extraocular movements intact.     Pupils: Pupils are equal, round, and reactive to light.  Cardiovascular:  Rate and Rhythm: Normal rate.     Pulses: Normal pulses.     Heart sounds: Normal heart sounds.  Pulmonary:     Effort: Pulmonary effort is normal.     Breath sounds: Normal breath sounds.  Abdominal:     General: Abdomen is flat.     Palpations: Abdomen is soft.  Musculoskeletal:        General: Normal range of motion.     Cervical back: Normal range of motion and neck supple. No tenderness.     Comments: No spinal or paraspinal tenderness noted.  Skin:    General: Skin is warm and dry.  Neurological:     Mental Status: He is alert and oriented to person, place, and time.     Comments: Cranial nerves II through XII intact, no sensory deficits in upper or lower extremities, 5/5 strength intact, no abnormal gait, deep tendon reflexes present.  Patient is able to stand upright and walk across the room without significant pain or abnormal gait.   Psychiatric:        Mood and Affect: Mood normal.        Behavior: Behavior normal.        Thought Content: Thought content normal.        Judgment: Judgment normal.     ____________________________________________    LABS  (all labs ordered are listed, but only abnormal results are displayed)  Labs Reviewed - No data to  display   ____________________________________________   EKG None.   ____________________________________________    RADIOLOGY I personally viewed and evaluated these images as part of my medical decision making, as well as reviewing the written report by the radiologist.  ED Provider Interpretation: I agree with the radiologist interpretation of these findings. Slight decrease in height of bodies of L1 and L2 vertebrae without definite demonstratable fracture line, suggesting potentially old mild compression fractures.  DG Lumbar Spine Complete  Result Date: 07/15/2021 CLINICAL DATA:  Back pain EXAM: LUMBAR SPINE - COMPLETE 4+ VIEW COMPARISON:  None. FINDINGS: Osteopenia is seen in bony structures. There is mild dextroscoliosis. There is slight decrease in height of bodies of L1 and L2 vertebrae. There is no demonstrable radiolucent fracture line. Degenerative changes are noted with disc space narrowing, bony spurs and facet hypertrophy throughout lumbar spine. There is possible spinal stenosis and encroachment of neural foramina in the lumbar spine. There are metallic densities in the pelvis, possibly suggesting previous brachytherapy. There is a large coarse calcification in the pelvis seen in the 2 of the images which may suggest an artifact outside the patient's body or soft tissue calcification. IMPRESSION: Severe degenerative changes are noted in the lumbar spine with disc space narrowing, bony spurs, facet hypertrophy and possibly spinal stenosis and encroachment of neural foramina at multiple levels. There is slight decrease in height of bodies of L1 and L2 vertebrae without definite demonstrable fracture line. This may suggest old mild compression fractures. If there are focal clinical findings in this region, follow-up MRI may be considered. Electronically Signed   By: Elmer Picker M.D.   On: 07/15/2021 16:51     ____________________________________________   PROCEDURES  Procedures   Medications - No data to display  Critical Care performed: No  ____________________________________________   INITIAL IMPRESSION / ASSESSMENT AND PLAN / ED COURSE  Pertinent labs & imaging results that were available during my care of the patient were reviewed by me and considered in my medical decision making (see chart for details).  Karis Emig is a 85 y.o. male, history of arthritis, aortic valve insufficiency, and atrial fibrillation, presents the emergency department for evaluation of back pain.  Differentials include, but not limited to: Serious: aortic dissection, AAA, cauda equina syndrome, spinal/epidural hematoma, epidural abscess, pulmonary embolism, osteomyelitis, malignancy, spinal fracture, PID  Common: muscle strain, herniated disc, nephrolithiasis, pyelonephritis, gallbladder disease, pneumonia   Patient overall appears well.  Physical exam unremarkable for any spinal or paraspinal tenderness.  Patient is able to ambulate across the room with little to no discomfort.  Vital signs are within normal limits for the patient.  No life-threatening pathology suspected.  Imaging shows slight decrease in height between L1 and L2 vertebrae, suggesting new versus old compression fractures.  I suspect this fracture versus acute muscle strain to be the etiology of this patient's pain. No further treatment or work-up indicated at this time in the emergency department.  We will plan to discharge this patient with orthopedic follow-up, as well as pain medication to be taken as needed if significant pain develops until his next appointment.    ____________________________________________   FINAL CLINICAL IMPRESSION(S) / ED DIAGNOSES  Final diagnoses:  Left-sided low back pain without sciatica, unspecified chronicity     NEW MEDICATIONS STARTED DURING THIS VISIT:  ED Discharge Orders           Ordered    HYDROcodone-acetaminophen (NORCO/VICODIN) 5-325 MG tablet  Every 6 hours PRN        07/15/21 1801             Note:  This document was prepared using Dragon voice recognition software and may include unintentional dictation errors.    Teodoro Spray, [place] 07/16/21 1103    Delman Kitten, MD 07/17/21 Einar Crow

## 2021-08-12 ENCOUNTER — Emergency Department: Payer: Medicare Other

## 2021-08-12 ENCOUNTER — Emergency Department
Admission: EM | Admit: 2021-08-12 | Discharge: 2021-08-12 | Disposition: A | Discharge status: Home / Self Care | Payer: Medicare Other | Attending: Emergency Medicine | Admitting: Emergency Medicine

## 2021-08-12 ENCOUNTER — Other Ambulatory Visit: Payer: Self-pay

## 2021-08-12 DIAGNOSIS — R531 Weakness: Secondary | ICD-10-CM | POA: Insufficient documentation

## 2021-08-12 DIAGNOSIS — R627 Adult failure to thrive: Secondary | ICD-10-CM | POA: Diagnosis not present

## 2021-08-12 DIAGNOSIS — Z96651 Presence of right artificial knee joint: Secondary | ICD-10-CM | POA: Insufficient documentation

## 2021-08-12 LAB — BASIC METABOLIC PANEL
Anion gap: 6 (ref 5–15)
BUN: 25 mg/dL — ABNORMAL HIGH (ref 8–23)
CO2: 29 mmol/L (ref 22–32)
Calcium: 9 mg/dL (ref 8.9–10.3)
Chloride: 102 mmol/L (ref 98–111)
Creatinine, Ser: 0.87 mg/dL (ref 0.61–1.24)
GFR, Estimated: >60 mL/min (ref >60)
Glucose, Bld: 111 mg/dL — ABNORMAL HIGH (ref 70–99)
Potassium: 3.5 mmol/L (ref 3.5–5.1)
Sodium: 137 mmol/L (ref 135–145)

## 2021-08-12 LAB — CBC WITH DIFFERENTIAL/PLATELET
Abs Immature Granulocytes: 0.03 10*3/uL (ref 0.00–0.07)
Basophils Absolute: 0 10*3/uL (ref 0.0–0.1)
Basophils Relative: 0 %
Eosinophils Absolute: 0 10*3/uL (ref 0.0–0.5)
Eosinophils Relative: 0 %
HCT: 37.8 % — ABNORMAL LOW (ref 39.0–52.0)
Hemoglobin: 12.8 g/dL — ABNORMAL LOW (ref 13.0–17.0)
Immature Granulocytes: 0 %
Lymphocytes Relative: 15 %
Lymphs Abs: 1.2 10*3/uL (ref 0.7–4.0)
MCH: 34 pg (ref 26.0–34.0)
MCHC: 33.9 g/dL (ref 30.0–36.0)
MCV: 100.5 fL — ABNORMAL HIGH (ref 80.0–100.0)
Monocytes Absolute: 0.9 10*3/uL (ref 0.1–1.0)
Monocytes Relative: 11 %
Neutro Abs: 5.7 10*3/uL (ref 1.7–7.7)
Neutrophils Relative %: 74 %
Platelets: 154 10*3/uL (ref 150–400)
RBC: 3.76 MIL/uL — ABNORMAL LOW (ref 4.22–5.81)
RDW: 13.4 % (ref 11.5–15.5)
WBC: 7.8 10*3/uL (ref 4.0–10.5)
nRBC: 0 % (ref 0.0–0.2)

## 2021-08-12 LAB — URINALYSIS, ROUTINE W REFLEX MICROSCOPIC
Bilirubin Urine: NEGATIVE
Glucose, UA: NEGATIVE mg/dL
Ketones, ur: NEGATIVE mg/dL
Leukocytes,Ua: NEGATIVE
Nitrite: NEGATIVE
Protein, ur: NEGATIVE mg/dL
Specific Gravity, Urine: 1.025 (ref 1.005–1.030)
pH: 6.5 (ref 5.0–8.0)

## 2021-08-12 LAB — URINALYSIS, MICROSCOPIC (REFLEX)
Bacteria, UA: NONE SEEN
Squamous Epithelial / HPF: NONE SEEN (ref 0–5)

## 2021-08-12 MED ORDER — SODIUM CHLORIDE 0.9 % IV BOLUS
500.0000 mL | Freq: Once | INTRAVENOUS | Status: AC
  Administered 2021-08-12: 500 mL via INTRAVENOUS

## 2021-08-12 NOTE — ED Provider Notes (Signed)
[facility] Emergency Department Provider Note  ____________________________________________   Event Date/Time   First MD Initiated Contact with Patient 08/12/21 0144     (approximate)  I have reviewed the triage vital signs and the nursing notes.   HISTORY  Chief Complaint Fall    HPI Terry Collins is a 85 y.o. male who presents by EMS for evaluation after a fall at home.  He lives at home by himself and said that for 6 months he has been gradually getting weaker and having more trouble with ambulation.  He describes his gait as having "short shuffling steps".  He uses a rolling walker.  He had knee surgery about a year ago and said that he had made some progress but over the last 6 months he feels like he has been going backwards in terms of his progress.  He has had multiple visits with his primary care doctor who said that they will continue to " watch it" in terms of his gradually worsening weakness and difficulties with ambulation.  Tonight he got up to go to the bathroom and he felt like his legs sort of gave out on him.  He did not strike his head but slid slowly to the floor.  He did not lose consciousness.  He has no headache or neck pain.  He was able to call EMS and when they stood him up to his feet he did not feel dizzy.  However he asked for transportation to the emergency department for further evaluation of his 6 months of gradually worsening weakness.  At this time he denies headache, dizziness, neck pain, chest pain, shortness of breath, nausea, vomiting, abdominal pain, and recent dysuria.  He has had no nausea nor vomiting.  No changes in his medications or eating habits of which she is aware.  He takes Pradaxa for history of paroxysmal atrial fibrillation.  The onset of the event tonight was acute and the symptoms were relatively mild but concerning to the patient.    Past Medical History:  Diagnosis Date   Anemia    Aortic valve  insufficiency    Arthritis    hands   Atherosclerosis of aorta (HCC)    Carotid stenosis    History of kidney stones 2010   Mixed hyperlipidemia    Paroxysmal atrial fibrillation (HCC)    Venous insufficiency of both lower extremities     Patient Active Problem List   Diagnosis Date Noted   Incarcerated inguinal hernia, unilateral 12/15/2020   Paroxysmal atrial fibrillation (HCC)    Leukocytosis    Acute metabolic encephalopathy    Normocytic anemia    S/P TKR (total knee replacement) using cement, right    Total knee replacement status, right 07/16/2020    Past Surgical History:  Procedure Laterality Date   CARDIAC ELECTROPHYSIOLOGY STUDY AND ABLATION  2019   [facility]   CARDIOVERSION N/A 07/07/2021   Procedure: CARDIOVERSION;  Surgeon: Corey Skains, MD;  Location: [facility] ORS;  Service: Cardiovascular;  Laterality: N/A;   cardiovesion     EYE SURGERY Bilateral 2012   cataracts   HERNIA REPAIR Left 2008   inguinal   INGUINAL HERNIA REPAIR Right 06/24/2016   Procedure: HERNIA REPAIR INGUINAL ADULT;  Surgeon: Leonie Green, MD;  Location: [facility] ORS;  Service: General;  Laterality: Right;   TOTAL KNEE ARTHROPLASTY Right 07/16/2020   Procedure: RIGHT TOTAL KNEE ARTHROPLASTY;  Surgeon: Thornton Park, MD;  Location: [facility] ORS;  Service: Orthopedics;  Laterality: Right;   XI ROBOTIC ASSISTED INGUINAL HERNIA REPAIR WITH MESH Bilateral 01/20/2020   Procedure: XI ROBOTIC ASSISTED INGUINAL HERNIA REPAIR WITH MESH;  Surgeon: Herbert Pun, MD;  Location: [facility] ORS;  Service: General;  Laterality: Bilateral;   XI ROBOTIC ASSISTED INGUINAL HERNIA REPAIR WITH MESH Right 12/16/2020   Procedure: XI ROBOTIC ASSISTED INGUINAL HERNIA REPAIR WITH MESH;  Surgeon: Herbert Pun, MD;  Location: [facility] ORS;  Service: General;  Laterality: Right;    Prior to Admission medications   Medication Sig Start Date End Date Taking? Authorizing Provider  Calcium Polycarbophil (FIBER-CAPS  PO) Take 3 capsules by mouth in the morning.    [provider]  cycloSPORINE (RESTASIS) 0.05 % ophthalmic emulsion Place 1 drop into both eyes 2 (two) times daily.    [provider]  dabigatran (PRADAXA) 150 MG CAPS capsule Take 150 mg by mouth 2 (two) times daily.    [provider]  dofetilide (TIKOSYN) 125 MCG capsule Take 125 mcg by mouth in the morning and at bedtime. 12/14/19   [provider]  ferrous sulfate 325 (65 FE) MG tablet Take 325 mg by mouth in the morning.    [provider]  folic acid (FOLVITE) 654 MCG tablet Take 800 mcg by mouth in the morning.    [provider]  HYDROcodone-acetaminophen (NORCO/VICODIN) 5-325 MG tablet Take 1 tablet by mouth every 6 (six) hours as needed for up to 10 doses for moderate pain. 07/15/21   Teodoro Spray, PA  metroNIDAZOLE (METROGEL) 1 % gel Apply 1 application topically at bedtime.     [provider]  Multiple Vitamin (MULTIVITAMIN WITH MINERALS) TABS tablet Take 1 tablet by mouth in the morning. Centrum Silver    [provider]  potassium chloride (MICRO-K) 10 MEQ CR capsule Take 10 mEq by mouth in the morning.    [provider]  vitamin B-12 (CYANOCOBALAMIN) 1000 MCG tablet Take 1,000 mcg by mouth in the morning.    [provider]    Allergies Lactose intolerance (gi) and Tramadol  Family History  Problem Relation Age of Onset   Heart attack Father    Atrial fibrillation Brother     Social History Social History   Tobacco Use   Smoking status: Never   Smokeless tobacco: Never  Substance Use Topics   Alcohol use: Yes    Alcohol/week: 1.0 standard drink    Types: 1 Glasses of wine per week    Comment: glass of wine week   Drug use: No    Review of Systems Constitutional: No fever/chills Eyes: No visual changes. ENT: No sore throat. Cardiovascular: Denies chest pain.  Denies syncope. Respiratory: Denies shortness of  breath. Gastrointestinal: No abdominal pain.  No nausea, no vomiting.  No diarrhea.  No constipation. Genitourinary: Negative for dysuria. Musculoskeletal: Positive for gradually worsening weakness over the last 6 months.  No acute pain at this time. Integumentary: Negative for rash. Neurological: Negative for headaches, focal weakness or numbness.   ____________________________________________   PHYSICAL EXAM:  VITAL SIGNS: ED Triage Vitals  Enc Vitals Group     BP 08/12/21 0143 (!) 171/95     Pulse Rate 08/12/21 0143 79     Resp 08/12/21 0143 20     Temp 08/12/21 0143 98.7 F (37.1 C)     Temp Source 08/12/21 0143 Oral     SpO2 08/12/21 0143 97 %     Weight 08/12/21 0145 76.8 kg (169 lb 5 oz)  Height --      Head Circumference --      Peak Flow --      Pain Score 08/12/21 0144 0     Pain Loc --      Pain Edu? --      Excl. in [place]? --     Constitutional: Alert and oriented.  Eyes: Conjunctivae are normal.  Head: Atraumatic. Nose: No congestion/rhinnorhea. Mouth/Throat: Patient is wearing a mask. Neck: No stridor.  No meningeal signs.   Cardiovascular: Normal rate, regular rhythm. Good peripheral circulation. Respiratory: Normal respiratory effort.  No retractions. Gastrointestinal: Soft and nontender. No distention.  Musculoskeletal: No lower extremity tenderness nor edema. No gross deformities of extremities.  Normal grip strength.  Major muscle groups strength in upper and lower extremities seems to be slightly less than anticipated but still equal and appropriate between the sides and upper and lower extremities. Neurologic:  Normal speech and language. No gross focal neurologic deficits are appreciated.  Movements are somewhat purposeful and hesitant but without any gross deficits.  No cogwheel rigidity nor resting tremor. Skin:  Skin is warm, dry and intact. Psychiatric: Mood and affect are normal. Speech and behavior are  normal.  ____________________________________________   LABS (all labs ordered are listed, but only abnormal results are displayed)  Labs Reviewed  URINALYSIS, ROUTINE W REFLEX MICROSCOPIC - Abnormal; Notable for the following components:      Result Value   Hgb urine dipstick MODERATE (*)    All other components within normal limits  CBC WITH DIFFERENTIAL/PLATELET - Abnormal; Notable for the following components:   RBC 3.76 (*)    Hemoglobin 12.8 (*)    HCT 37.8 (*)    MCV 100.5 (*)    All other components within normal limits  BASIC METABOLIC PANEL - Abnormal; Notable for the following components:   Glucose, Bld 111 (*)    BUN 25 (*)    All other components within normal limits  URINE CULTURE  URINALYSIS, MICROSCOPIC (REFLEX)   ____________________________________________  EKG  ED ECG REPORT I, Hinda Kehr, the attending physician, personally viewed and interpreted this ECG.  Date: 08/12/2021 EKG Time: 1:45 AM Rate: 82 Rhythm: Sinus rhythm with first-degree AV block QRS Axis: normal Intervals: Slightly prolonged PR interval at 218 ms.  Right bundle branch block and left anterior fascicular block. ST/T Wave abnormalities: Non-specific ST segment / T-wave changes, but no clear evidence of acute ischemia. Narrative Interpretation: no definitive evidence of acute ischemia; does not meet STEMI criteria.  ____________________________________________  RADIOLOGY I, Hinda Kehr, personally viewed and evaluated these images (plain radiographs) as part of my medical decision making, as well as reviewing the written report by the radiologist.  ED MD interpretation:  No acute abnormalities  Official radiology report(s): CT HEAD WO CONTRAST (5MM)  Result Date: 08/12/2021 CLINICAL DATA:  Dizziness, fall, generalized weakness EXAM: CT HEAD WITHOUT CONTRAST TECHNIQUE: Contiguous axial images were obtained from the base of the skull through the vertex without intravenous  contrast. COMPARISON:  07/20/2020 FINDINGS: Brain: No evidence of acute infarction, hemorrhage, hydrocephalus, extra-axial collection or mass lesion/mass effect. Subcortical white matter and periventricular small vessel ischemic changes. Vascular: No hyperdense vessel or unexpected calcification. Skull: Normal. Negative for fracture or focal lesion. Sinuses/Orbits: The visualized paranasal sinuses are essentially clear. The mastoid air cells are unopacified. Other: None. IMPRESSION: No evidence of acute intracranial abnormality. Small vessel ischemic changes. Electronically Signed   By: Julian Hy M.D.   On: 08/12/2021 02:19  ____________________________________________   PROCEDURES   Procedure(s) performed (including Critical Care):  .1-3 Lead EKG Interpretation Performed by: Hinda Kehr, MD Authorized by: Hinda Kehr, MD     Interpretation: normal     ECG rate:  85   ECG rate assessment: normal     Rhythm: sinus rhythm     Ectopy: none     Conduction: normal     ____________________________________________   INITIAL IMPRESSION / MDM / ASSESSMENT AND PLAN / ED COURSE  As part of my medical decision making, I reviewed the following data within the [place] notes reviewed and incorporated, Labs reviewed , EKG interpreted , Old chart reviewed, and Notes from prior ED visits   Differential diagnosis includes, but is not limited to, gradual failure to thrive over time, Parkinson disease or parkinsonian symptoms, other nonspecific neurological decline, renal failure, aortic insufficiency, volume depletion, intracranial bleed, CVA.  The patient is on the cardiac monitor to evaluate for evidence of arrhythmia and/or significant heart rate changes.  The signs and symptoms described by the patient suggest more of a long-term, subacute or chronic issue than acute concerns tonight.  His vital signs are notable for hypertension, otherwise unremarkable.   He has no specific complaints or concerns other than the gradually worsening weakness over 6 months.  He lives at home alone and likely his event tonight of sliding to the floor not being able to get up was of concern to him.  He said he recently saw a provider in his primary care office who did not have any specific recommendations or treatment plan at that time.  I will evaluate broadly with some lab work and a CT head to look for any evidence of neoplasm or other indication of why he would be gradually worsening over time.  I explained to the patient I think it is unlikely we will identify a specific cause or acute/emergent condition at this time and he would most likely need to follow-up with his PCP.  Given his age and the possibility that UTI could also be contributing to some generalized weakness, I will also check a urine specimen.  He understands and agrees with the plan.  Of note, at no point has he had any chest pain or shortness of breath and his EKG shows no sign of ischemia.  I will not obtain a high-sensitivity troponin given that these tests are generally recommended for chest pain and he has not having any chest pain or any indication of a primary cardiac event.     Clinical Course as of 08/12/21 0344  Thu Aug 12, 2021  7619 Basic metabolic panel and CBC are both reassuring with a normal hemoglobin, no leukocytosis, and essentially normal renal function other than a very slightly increased BUN. [CF]  0229 CT HEAD WO CONTRAST (5MM) I personally reviewed the CT of the head and reviewed the radiology report.  I agree that there is no evidence of an acute or emergent medical condition at this time. [CF]  5093 The patient's work-up has been quite reassuring.  He has some evidence of microscopic hematuria but no evidence of active infection and no urinary symptoms.  I ordered a urine culture but will not treat empirically.  The patient feels about the same without any specific symptoms.  I  talked with him about the long-term nature of the symptoms and the fact that I do not have anything to justify inpatient hospitalization.  He said that he understands.  He  said that he needs to come to grips with the fact that he should speak with his primary care provider and the [facility] (where he lives) about transitioning from independent living to assisted living.  Given that he has ambulation issues at baseline and requires a rolling walker and/or cane, I believe that he qualifies for medical transport by EMS back to his home.  After we discussed all this he said that he is comfortable returning back to his home and contacting Dr. Ginette Pitman in the morning, as well as speaking with the [facility] staff, about increasing his level of care at home.  I think this is very appropriate and much better than him boarding in the emergency department.  I gave my usual and customary follow-up recommendations and return precautions. [CF]    Clinical Course User Index [CF] Hinda Kehr, MD     ____________________________________________  FINAL CLINICAL IMPRESSION(S) / ED DIAGNOSES  Final diagnoses:  Failure to thrive in adult  Generalized weakness     MEDICATIONS GIVEN DURING THIS VISIT:  Medications  sodium chloride 0.9 % bolus 500 mL (0 mLs Intravenous Stopped 08/12/21 0250)     ED Discharge Orders     None        Note:  This document was prepared using Dragon voice recognition software and may include unintentional dictation errors.   Hinda Kehr, MD 08/12/21 [phone]

## 2021-08-12 NOTE — ED Triage Notes (Signed)
Arrived via EMS, patient reports he slid from bed and fell to floor. Denies injuries from fall. Reporting gradual increased generalized weakness x 6 months. AOX4 on arrival. Resp even, unlabored on RA.

## 2021-08-12 NOTE — ED Notes (Signed)
Patient reports his son called back and is coming to pick him up. States son will arrive around 0800.

## 2021-08-12 NOTE — Discharge Instructions (Addendum)
Your workup in the Emergency Department today was reassuring.  We did not find any specific abnormalities.  It sounds as if you are suffering from some long-term issues that are most likely related to your age, and as you stated to [place], it is likely time for you to discuss with your primary care provider as well as with the staff at your living facility about whether you would benefit from a higher level of care, such as assisted living, rather than continuing with independent living.   We recommend that in the morning you contact your primary care provider and the staff at the [facility] to discuss your options.  Continue taking your regular medications.  Return to the Emergency Department if you develop new or worsening symptoms that concern you.

## 2021-08-12 NOTE — ED Notes (Signed)
Pt wheeled to waiting room. Pt verbalized understanding of discharge instructions. Pt placed into car with his son.

## 2021-08-12 NOTE — ED Notes (Addendum)
Patient is resident at the [facility].  Attempting to reach facility to arrange transportation home, but no answer at this time.

## 2021-08-13 LAB — URINE CULTURE: Culture: <10000 — AB

## 2021-09-08 DIAGNOSIS — M48061 Spinal stenosis, lumbar region without neurogenic claudication: Secondary | ICD-10-CM | POA: Insufficient documentation

## 2022-03-31 ENCOUNTER — Ambulatory Visit: Payer: Medicare Other | Admitting: Speech Pathology

## 2022-04-25 ENCOUNTER — Encounter: Payer: Medicare Other | Admitting: Speech Pathology

## 2022-04-26 ENCOUNTER — Encounter: Payer: Medicare Other | Admitting: Speech Pathology

## 2022-04-27 ENCOUNTER — Encounter: Payer: Medicare Other | Admitting: Speech Pathology

## 2022-04-28 ENCOUNTER — Encounter: Payer: Medicare Other | Admitting: Speech Pathology

## 2022-05-03 ENCOUNTER — Encounter: Payer: Medicare Other | Admitting: Speech Pathology

## 2022-05-04 ENCOUNTER — Encounter: Payer: Medicare Other | Admitting: Speech Pathology

## 2022-05-05 ENCOUNTER — Encounter: Payer: Medicare Other | Admitting: Speech Pathology

## 2022-05-09 ENCOUNTER — Encounter: Payer: Medicare Other | Admitting: Speech Pathology

## 2022-05-10 ENCOUNTER — Encounter: Payer: Medicare Other | Admitting: Speech Pathology

## 2022-05-11 ENCOUNTER — Encounter: Payer: Medicare Other | Admitting: Speech Pathology

## 2022-05-12 ENCOUNTER — Encounter: Payer: Medicare Other | Admitting: Speech Pathology

## 2022-05-16 ENCOUNTER — Encounter: Payer: Medicare Other | Admitting: Speech Pathology

## 2022-05-17 ENCOUNTER — Encounter: Payer: Medicare Other | Admitting: Speech Pathology

## 2022-05-18 ENCOUNTER — Encounter: Payer: Medicare Other | Admitting: Speech Pathology

## 2022-05-19 ENCOUNTER — Encounter: Payer: Medicare Other | Admitting: Speech Pathology

## 2023-06-01 ENCOUNTER — Inpatient Hospital Stay
Admission: EM | Admit: 2023-06-01 | Discharge: 2023-06-05 | DRG: 522 | Disposition: A | Discharge status: Skilled Nursing Facility | Payer: Medicare Other | Source: Skilled Nursing Facility | Attending: Internal Medicine | Admitting: Internal Medicine

## 2023-06-01 ENCOUNTER — Inpatient Hospital Stay
Admit: 2023-06-01 | Discharge: 2023-06-01 | Disposition: A | Discharge status: Home / Self Care | Payer: Medicare Other | Attending: Student | Admitting: Student

## 2023-06-01 ENCOUNTER — Emergency Department: Payer: Medicare Other

## 2023-06-01 ENCOUNTER — Encounter: Payer: Self-pay | Admitting: Emergency Medicine

## 2023-06-01 ENCOUNTER — Other Ambulatory Visit: Payer: Self-pay

## 2023-06-01 DIAGNOSIS — I4891 Unspecified atrial fibrillation: Secondary | ICD-10-CM | POA: Insufficient documentation

## 2023-06-01 DIAGNOSIS — S72002A Fracture of unspecified part of neck of left femur, initial encounter for closed fracture: Principal | ICD-10-CM | POA: Insufficient documentation

## 2023-06-01 DIAGNOSIS — I2510 Atherosclerotic heart disease of native coronary artery without angina pectoris: Secondary | ICD-10-CM | POA: Diagnosis present

## 2023-06-01 DIAGNOSIS — G20A1 Parkinson's disease without dyskinesia, without mention of fluctuations: Secondary | ICD-10-CM | POA: Diagnosis present

## 2023-06-01 DIAGNOSIS — J449 Chronic obstructive pulmonary disease, unspecified: Secondary | ICD-10-CM | POA: Diagnosis present

## 2023-06-01 DIAGNOSIS — R030 Elevated blood-pressure reading, without diagnosis of hypertension: Secondary | ICD-10-CM | POA: Diagnosis present

## 2023-06-01 DIAGNOSIS — I4811 Longstanding persistent atrial fibrillation: Secondary | ICD-10-CM | POA: Diagnosis present

## 2023-06-01 DIAGNOSIS — M19042 Primary osteoarthritis, left hand: Secondary | ICD-10-CM | POA: Diagnosis present

## 2023-06-01 DIAGNOSIS — I872 Venous insufficiency (chronic) (peripheral): Secondary | ICD-10-CM | POA: Diagnosis present

## 2023-06-01 DIAGNOSIS — N179 Acute kidney failure, unspecified: Secondary | ICD-10-CM | POA: Diagnosis present

## 2023-06-01 DIAGNOSIS — E739 Lactose intolerance, unspecified: Secondary | ICD-10-CM | POA: Diagnosis present

## 2023-06-01 DIAGNOSIS — Z96651 Presence of right artificial knee joint: Secondary | ICD-10-CM | POA: Diagnosis present

## 2023-06-01 DIAGNOSIS — E782 Mixed hyperlipidemia: Secondary | ICD-10-CM | POA: Diagnosis present

## 2023-06-01 DIAGNOSIS — E876 Hypokalemia: Secondary | ICD-10-CM | POA: Diagnosis present

## 2023-06-01 DIAGNOSIS — W010XXA Fall on same level from slipping, tripping and stumbling without subsequent striking against object, initial encounter: Secondary | ICD-10-CM | POA: Diagnosis present

## 2023-06-01 DIAGNOSIS — Z79899 Other long term (current) drug therapy: Secondary | ICD-10-CM

## 2023-06-01 DIAGNOSIS — S72009A Fracture of unspecified part of neck of unspecified femur, initial encounter for closed fracture: Secondary | ICD-10-CM | POA: Diagnosis present

## 2023-06-01 DIAGNOSIS — Y92090 Kitchen in other non-institutional residence as the place of occurrence of the external cause: Secondary | ICD-10-CM | POA: Diagnosis not present

## 2023-06-01 DIAGNOSIS — M25552 Pain in left hip: Secondary | ICD-10-CM | POA: Diagnosis present

## 2023-06-01 DIAGNOSIS — M19041 Primary osteoarthritis, right hand: Secondary | ICD-10-CM | POA: Diagnosis present

## 2023-06-01 DIAGNOSIS — I352 Nonrheumatic aortic (valve) stenosis with insufficiency: Secondary | ICD-10-CM | POA: Diagnosis present

## 2023-06-01 DIAGNOSIS — Z87442 Personal history of urinary calculi: Secondary | ICD-10-CM | POA: Diagnosis not present

## 2023-06-01 DIAGNOSIS — D696 Thrombocytopenia, unspecified: Secondary | ICD-10-CM | POA: Diagnosis present

## 2023-06-01 DIAGNOSIS — Z7902 Long term (current) use of antithrombotics/antiplatelets: Secondary | ICD-10-CM | POA: Diagnosis not present

## 2023-06-01 DIAGNOSIS — Z885 Allergy status to narcotic agent status: Secondary | ICD-10-CM | POA: Diagnosis not present

## 2023-06-01 DIAGNOSIS — S72012A Unspecified intracapsular fracture of left femur, initial encounter for closed fracture: Secondary | ICD-10-CM | POA: Diagnosis present

## 2023-06-01 DIAGNOSIS — I700 Atherosclerosis of aorta: Secondary | ICD-10-CM | POA: Diagnosis present

## 2023-06-01 DIAGNOSIS — I5032 Chronic diastolic (congestive) heart failure: Secondary | ICD-10-CM | POA: Diagnosis present

## 2023-06-01 DIAGNOSIS — Z8249 Family history of ischemic heart disease and other diseases of the circulatory system: Secondary | ICD-10-CM

## 2023-06-01 LAB — CBC WITH DIFFERENTIAL/PLATELET
Abs Immature Granulocytes: 0.04 10*3/uL (ref 0.00–0.07)
Basophils Absolute: 0 10*3/uL (ref 0.0–0.1)
Basophils Relative: 0 %
Eosinophils Absolute: 0 10*3/uL (ref 0.0–0.5)
Eosinophils Relative: 0 %
HCT: 43.1 % (ref 39.0–52.0)
Hemoglobin: 14 g/dL (ref 13.0–17.0)
Immature Granulocytes: 1 %
Lymphocytes Relative: 7 %
Lymphs Abs: 0.6 10*3/uL — ABNORMAL LOW (ref 0.7–4.0)
MCH: 33.5 pg (ref 26.0–34.0)
MCHC: 32.5 g/dL (ref 30.0–36.0)
MCV: 103.1 fL — ABNORMAL HIGH (ref 80.0–100.0)
Monocytes Absolute: 0.4 10*3/uL (ref 0.1–1.0)
Monocytes Relative: 5 %
Neutro Abs: 6.9 10*3/uL (ref 1.7–7.7)
Neutrophils Relative %: 87 %
Platelets: 151 10*3/uL (ref 150–400)
RBC: 4.18 MIL/uL — ABNORMAL LOW (ref 4.22–5.81)
RDW: 14 % (ref 11.5–15.5)
WBC: 7.9 10*3/uL (ref 4.0–10.5)
nRBC: 0 % (ref 0.0–0.2)

## 2023-06-01 LAB — BASIC METABOLIC PANEL
Anion gap: 8 (ref 5–15)
BUN: 25 mg/dL — ABNORMAL HIGH (ref 8–23)
CO2: 32 mmol/L (ref 22–32)
Calcium: 9.2 mg/dL (ref 8.9–10.3)
Chloride: 100 mmol/L (ref 98–111)
Creatinine, Ser: 0.93 mg/dL (ref 0.61–1.24)
GFR, Estimated: >60 mL/min (ref >60)
Glucose, Bld: 116 mg/dL — ABNORMAL HIGH (ref 70–99)
Potassium: 3.7 mmol/L (ref 3.5–5.1)
Sodium: 140 mmol/L (ref 135–145)

## 2023-06-01 LAB — ECHOCARDIOGRAM COMPLETE
AR max vel: 3.15 cm2
AV Area VTI: 4.21 cm2
AV Area mean vel: 3.35 cm2
AV Mean grad: 3 mm[Hg]
AV Peak grad: 6 mm[Hg]
Ao pk vel: 1.22 m/s
Area-P 1/2: 6.37 cm2
Height: 74 in
P 1/2 time: 422 ms
S' Lateral: 2.1 cm
Weight: 2640 [oz_av]

## 2023-06-01 LAB — PROTIME-INR
INR: 1.4 — ABNORMAL HIGH (ref 0.8–1.2)
Prothrombin Time: 17.2 s — ABNORMAL HIGH (ref 11.4–15.2)

## 2023-06-01 LAB — TYPE AND SCREEN
ABO/RH(D): O POS
Antibody Screen: NEGATIVE

## 2023-06-01 LAB — APTT: aPTT: 38 s — ABNORMAL HIGH (ref 24–36)

## 2023-06-01 LAB — TROPONIN I (HIGH SENSITIVITY)
Troponin I (High Sensitivity): 13 ng/L (ref <18)
Troponin I (High Sensitivity): 16 ng/L (ref <18)

## 2023-06-01 MED ORDER — TRANEXAMIC ACID-NACL 1000-0.7 MG/100ML-% IV SOLN: 1000.0000 mg | INTRAVENOUS | Status: DC

## 2023-06-01 MED ORDER — GABAPENTIN 100 MG PO CAPS
100.0000 mg | ORAL_CAPSULE | Freq: Two times a day (BID) | ORAL | Status: DC
  Administered 2023-06-01 – 2023-06-05 (×8): 100 mg via ORAL
  Filled 2023-06-01 (×8): qty 1

## 2023-06-01 MED ORDER — HYDROMORPHONE HCL 1 MG/ML IJ SOLN
0.5000 mg | INTRAMUSCULAR | Status: DC | PRN
  Administered 2023-06-01: 0.5 mg via INTRAVENOUS
  Filled 2023-06-01 (×2): qty 0.5

## 2023-06-01 MED ORDER — FUROSEMIDE 40 MG PO TABS
40.0000 mg | ORAL_TABLET | Freq: Every day | ORAL | Status: DC
  Filled 2023-06-01: qty 1

## 2023-06-01 MED ORDER — BACITRACIN-NEOMYCIN-POLYMYXIN OINTMENT TUBE
TOPICAL_OINTMENT | Freq: Two times a day (BID) | CUTANEOUS | Status: DC
  Filled 2023-06-01: qty 14.17

## 2023-06-01 MED ORDER — SENNOSIDES-DOCUSATE SODIUM 8.6-50 MG PO TABS: 1.0000 | ORAL_TABLET | Freq: Every evening | ORAL | Status: DC | PRN

## 2023-06-01 MED ORDER — METOPROLOL TARTRATE 5 MG/5ML IV SOLN: 5.0000 mg | INTRAVENOUS | Status: DC | PRN

## 2023-06-01 MED ORDER — HEPARIN (PORCINE) 25000 UT/250ML-% IV SOLN
1200.0000 [IU]/h | INTRAVENOUS | Status: DC
  Administered 2023-06-01: 1000 [IU]/h via INTRAVENOUS
  Filled 2023-06-01: qty 250

## 2023-06-01 MED ORDER — MORPHINE SULFATE (PF) 4 MG/ML IV SOLN
4.0000 mg | INTRAVENOUS | Status: AC | PRN
  Administered 2023-06-01 (×2): 4 mg via INTRAVENOUS
  Filled 2023-06-01 (×2): qty 1

## 2023-06-01 MED ORDER — HYDROCODONE-ACETAMINOPHEN 5-325 MG PO TABS
1.0000 | ORAL_TABLET | Freq: Four times a day (QID) | ORAL | Status: DC | PRN
  Administered 2023-06-01: 2 via ORAL
  Filled 2023-06-01: qty 2

## 2023-06-01 MED ORDER — CARBIDOPA-LEVODOPA 25-100 MG PO TABS
1.0000 | ORAL_TABLET | Freq: Four times a day (QID) | ORAL | Status: DC
  Administered 2023-06-01 – 2023-06-05 (×15): 1 via ORAL
  Filled 2023-06-01 (×15): qty 1

## 2023-06-01 MED ORDER — CYCLOSPORINE 0.05 % OP EMUL
1.0000 [drp] | Freq: Two times a day (BID) | OPHTHALMIC | Status: DC
  Administered 2023-06-01 – 2023-06-05 (×8): 1 [drp] via OPHTHALMIC
  Filled 2023-06-01 (×9): qty 30

## 2023-06-01 MED ORDER — CARBIDOPA-LEVODOPA 25-100 MG PO TABS: 1.0000 | ORAL_TABLET | Freq: Every day | ORAL | Status: DC

## 2023-06-01 MED ORDER — CARBIDOPA-LEVODOPA 25-100 MG PO TABS
2.0000 | ORAL_TABLET | Freq: Every day | ORAL | Status: DC
  Administered 2023-06-02 – 2023-06-05 (×3): 2 via ORAL
  Filled 2023-06-01 (×5): qty 2

## 2023-06-01 MED ORDER — ONDANSETRON HCL 4 MG/2ML IJ SOLN: 4.0000 mg | Freq: Four times a day (QID) | INTRAMUSCULAR | Status: DC | PRN

## 2023-06-01 MED ORDER — CEFAZOLIN SODIUM-DEXTROSE 2-4 GM/100ML-% IV SOLN
2.0000 g | INTRAVENOUS | Status: AC
  Administered 2023-06-02: 2 g via INTRAVENOUS

## 2023-06-01 MED ORDER — FENTANYL CITRATE PF 50 MCG/ML IJ SOSY
50.0000 ug | PREFILLED_SYRINGE | Freq: Once | INTRAMUSCULAR | Status: AC
  Administered 2023-06-01: 50 ug via INTRAVENOUS
  Filled 2023-06-01: qty 1

## 2023-06-01 MED ORDER — ONDANSETRON HCL 4 MG/2ML IJ SOLN
4.0000 mg | Freq: Once | INTRAMUSCULAR | Status: AC
  Administered 2023-06-01: 4 mg via INTRAVENOUS
  Filled 2023-06-01: qty 2

## 2023-06-01 MED ORDER — METOPROLOL TARTRATE 25 MG PO TABS
25.0000 mg | ORAL_TABLET | Freq: Four times a day (QID) | ORAL | Status: DC
  Administered 2023-06-01 – 2023-06-02 (×4): 25 mg via ORAL
  Filled 2023-06-01 (×4): qty 1

## 2023-06-01 MED ORDER — FUROSEMIDE 40 MG PO TABS
40.0000 mg | ORAL_TABLET | Freq: Every day | ORAL | Status: DC
  Administered 2023-06-02 – 2023-06-05 (×4): 40 mg via ORAL
  Filled 2023-06-01 (×4): qty 1

## 2023-06-01 NOTE — ED Notes (Deleted)
A 

## 2023-06-01 NOTE — ED Provider Notes (Signed)
[facility] Provider Note    Event Date/Time   First MD Initiated Contact with Patient 06/01/23 [phone]     (approximate)   History   Fall   HPI  Terry Collins is a 87 y.o. male with a history of Parkinson's, on Pradaxa who presents after a fall with left hip injury.  No head injury, no other injuries reported     Physical Exam   Triage Vital Signs: ED Triage Vitals  Encounter Vitals Group     BP 06/01/23 0841 (!) 168/95     Systolic BP Percentile --      Diastolic BP Percentile --      Pulse Rate 06/01/23 0841 73     Resp 06/01/23 0841 15     Temp 06/01/23 0841 98 F (36.7 C)     Temp Source 06/01/23 0841 Oral     SpO2 06/01/23 0841 100 %     Weight 06/01/23 0839 74.8 kg (165 lb)     Height 06/01/23 0839 1.88 m (6\' 2" )     Head Circumference --      Peak Flow --      Pain Score 06/01/23 0839 9     Pain Loc --      Pain Education --      Exclude from Growth Chart --     Most recent vital signs: Vitals:   06/01/23 0841  BP: (!) 168/95  Pulse: 73  Resp: 15  Temp: 98 F (36.7 C)  SpO2: 100%     General: Awake, no distress.  CV:  Good peripheral perfusion.  Resp:  Normal effort.  Abd:  No distention.  Other:  Left hip tender to palpation, left leg held in external rotation, mild shortening noted, warm and well-perfused distally, +1 edema bilaterally   ED Results / Procedures / Treatments   Labs (all labs ordered are listed, but only abnormal results are displayed) Labs Reviewed  BASIC METABOLIC PANEL - Abnormal; Notable for the following components:      Result Value   Glucose, Bld 116 (*)    BUN 25 (*)    All other components within normal limits  CBC WITH DIFFERENTIAL/PLATELET - Abnormal; Notable for the following components:   RBC 4.18 (*)    MCV 103.1 (*)    Lymphs Abs 0.6 (*)    All other components within normal limits  PROTIME-INR  TYPE AND SCREEN     EKG ED ECG REPORT I, Jene Every, the attending  physician, personally viewed and interpreted this ECG.  Date: 06/01/2023  Rhythm: normal sinus rhythm QRS Axis: normal Intervals: normal ST/T Wave abnormalities: normal Narrative Interpretation: no evidence of acute ischemia     RADIOLOGY  Hip x-ray viewed interpret by me,   PROCEDURES:  Critical Care performed:   Procedures   MEDICATIONS ORDERED IN ED: Medications  tranexamic acid (CYKLOKAPRON) IVPB 1,000 mg (has no administration in time range)  morphine (PF) 4 MG/ML injection 4 mg (4 mg Intravenous Given 06/01/23 1033)  ondansetron (ZOFRAN) injection 4 mg (4 mg Intravenous Given 06/01/23 0854)     IMPRESSION / MDM / ASSESSMENT AND PLAN / ED COURSE  I reviewed the triage vital signs and the nursing notes. Patient's presentation is most consistent with acute presentation with potential threat to life or bodily function.  Patient presents after a fall with hip injury as above, strongly suspicious for hip fracture versus contusion.  Patient is on Pradaxa, no head injury.  Reassuring  exam otherwise  Pending x-ray, will treat with IV morphine, IV Zofran  X-rays consistent with left intertrochanteric fracture  Discussed with Dr. Signa Kell of orthopedic surgery, he will see the patient, has requested 1 g of TXA to be given now in the emergency department.  Surgery will be tomorrow given Pradaxa was taken this morning  Consult medicine for admission      FINAL CLINICAL IMPRESSION(S) / ED DIAGNOSES   Final diagnoses:  Closed fracture of left hip, initial encounter [facility])     Rx / DC Orders   ED Discharge Orders     None        Note:  This document was prepared using Dragon voice recognition software and may include unintentional dictation errors.   Jene Every, MD 06/01/23 1037

## 2023-06-01 NOTE — Consult Note (Signed)
[facility] CARDIOLOGY CONSULT NOTE       Patient ID: Terry Collins MRN: [id number] DOB/AGE: 1936/07/31 87 y.o.  Admit date: 06/01/2023 Referring Physician Dr. Mikey College Primary Physician Dr. Marcello Fennel Primary Cardiologist Dr. Juliann Pares Reason for Consultation POC, atrial fibrillation  HPI: Terry Collins is a 87 y.o. male  with a past medical history of moderate AR, longstanding persistent atrial fibrillation s/p PVI 09/2017 (Pradaxa), carotid artery disease, hyperlipidemia, parkinson's who presented to the ED on 06/01/2023 for fall and hip pain. Found to be in atrial fibrillation with elevated rate. Cardiology was consulted for further evaluation.   Patient states that he was in his usual state of health until this morning when he was in his kitchen and lost his balance and fell on his left hip.  Denies any chest pain, palpitations, dizziness or loss of consciousness at time of the fall.  He immediately had pain in his hip and was brought to the ED by EMS for further evaluation.  Evaluation in the ED notable for x-ray with left femoral neck fracture.  Chest x-ray with interstitial edema vascular congestion.  Lab work notable for creatinine 0.93, potassium 3.7, hemoglobin 14.0.  Troponins trended 13 > 16.  EKG demonstrated atrial fibrillation.  He was noted to have elevated heart rate on telemetry.   Patient reports this afternoon that he is feeling okay.  States that his hip pain has improved since receiving pain medication.  States that otherwise he has been doing well recently.  He exercises 3 times per week by walking and doing resistance training.  He lives at the [place] [facility] independently but has a caregiver who checks in on him daily and takes him to the gym and to the grocery store.  Denies any recent issues with chest pain or shortness of breath.  Denies any exertional symptoms with activity.  He has longstanding persistent atrial fibrillation for which he previously took dofetilide  but this was discontinued earlier this year.  He states that prior to admission he was not taking any medication for rate control.  Denies any significant palpitation symptoms.  Has chronic lower extremity edema which is overall improved from last year.  Caregiver present today reports that this is mildly worse today than it has been.  Review of systems complete and found to be negative unless listed above    Past Medical History:  Diagnosis Date   Anemia    Aortic valve insufficiency    Arthritis    hands   Atherosclerosis of aorta (HCC)    Carotid stenosis    History of kidney stones 2010   Mixed hyperlipidemia    Paroxysmal atrial fibrillation (HCC)    Venous insufficiency of both lower extremities     Past Surgical History:  Procedure Laterality Date   CARDIAC ELECTROPHYSIOLOGY STUDY AND ABLATION  2019   [facility]   CARDIOVERSION N/A 07/07/2021   Procedure: CARDIOVERSION;  Surgeon: Lamar Blinks, MD;  Location: [facility] ORS;  Service: Cardiovascular;  Laterality: N/A;   cardiovesion     EYE SURGERY Bilateral 2012   cataracts   HERNIA REPAIR Left 2008   inguinal   INGUINAL HERNIA REPAIR Right 06/24/2016   Procedure: HERNIA REPAIR INGUINAL ADULT;  Surgeon: Nadeen Landau, MD;  Location: [facility] ORS;  Service: General;  Laterality: Right;   TOTAL KNEE ARTHROPLASTY Right 07/16/2020   Procedure: RIGHT TOTAL KNEE ARTHROPLASTY;  Surgeon: Juanell Fairly, MD;  Location: [facility] ORS;  Service: Orthopedics;  Laterality: Right;   XI  [facility] CARDIOLOGY CONSULT NOTE       Patient ID: Terry Collins MRN: [id number] DOB/AGE: 1936/07/31 87 y.o.  Admit date: 06/01/2023 Referring Physician Dr. Mikey College Primary Physician Dr. Marcello Fennel Primary Cardiologist Dr. Juliann Pares Reason for Consultation POC, atrial fibrillation  HPI: Terry Collins is a 87 y.o. male  with a past medical history of moderate AR, longstanding persistent atrial fibrillation s/p PVI 09/2017 (Pradaxa), carotid artery disease, hyperlipidemia, parkinson's who presented to the ED on 06/01/2023 for fall and hip pain. Found to be in atrial fibrillation with elevated rate. Cardiology was consulted for further evaluation.   Patient states that he was in his usual state of health until this morning when he was in his kitchen and lost his balance and fell on his left hip.  Denies any chest pain, palpitations, dizziness or loss of consciousness at time of the fall.  He immediately had pain in his hip and was brought to the ED by EMS for further evaluation.  Evaluation in the ED notable for x-ray with left femoral neck fracture.  Chest x-ray with interstitial edema vascular congestion.  Lab work notable for creatinine 0.93, potassium 3.7, hemoglobin 14.0.  Troponins trended 13 > 16.  EKG demonstrated atrial fibrillation.  He was noted to have elevated heart rate on telemetry.   Patient reports this afternoon that he is feeling okay.  States that his hip pain has improved since receiving pain medication.  States that otherwise he has been doing well recently.  He exercises 3 times per week by walking and doing resistance training.  He lives [place] the [place] [facility] independently but has a caregiver who checks in on him daily and takes him to the gym and to the grocery store.  Denies any recent issues with chest pain or shortness of breath.  Denies any exertional symptoms with activity.  He has longstanding persistent atrial fibrillation for which he previously took dofetilide  but this was discontinued earlier this year.  He states that prior to admission he was not taking any medication for rate control.  Denies any significant palpitation symptoms.  Has chronic lower extremity edema which is overall improved from last year.  Caregiver present today reports that this is mildly worse today than it has been.  Review of systems complete and found to be negative unless listed above    Past Medical History:  Diagnosis Date   Anemia    Aortic valve insufficiency    Arthritis    hands   Atherosclerosis of aorta (HCC)    Carotid stenosis    History of kidney stones 2010   Mixed hyperlipidemia    Paroxysmal atrial fibrillation (HCC)    Venous insufficiency of both lower extremities     Past Surgical History:  Procedure Laterality Date   CARDIAC ELECTROPHYSIOLOGY STUDY AND ABLATION  2019   [facility]   CARDIOVERSION N/A 07/07/2021   Procedure: CARDIOVERSION;  Surgeon: Lamar Blinks, MD;  Location: [facility] ORS;  Service: Cardiovascular;  Laterality: N/A;   cardiovesion     EYE SURGERY Bilateral 2012   cataracts   HERNIA REPAIR Left 2008   inguinal   INGUINAL HERNIA REPAIR Right 06/24/2016   Procedure: HERNIA REPAIR INGUINAL ADULT;  Surgeon: Nadeen Landau, MD;  Location: [facility] OR[facility];  Service: General;  Laterality: Right;   TOTAL KNEE ARTHROPLASTY Right 07/16/2020   Procedure: RIGHT TOTAL KNEE ARTHROPLASTY;  Surgeon: Juanell Fairly, MD;  Location: [facility] ORS;  Service: Orthopedics;  Laterality: Right;   XI  Oral  SpO2:  90% 90%   Weight:      Height:        PHYSICAL EXAM General: Chronically ill-appearing, well nourished, in no acute distress laying flat in ED stretcher. HEENT: Normocephalic and atraumatic. Neck: No JVD.  Lungs: Normal respiratory effort on room air. Clear bilaterally to auscultation. No wheezes, crackles, rhonchi.  Heart: Irregularly irregular, elevated rate. Normal S1 and S2 without gallops or murmurs.  Abdomen: Non-distended appearing.  Msk: Normal strength and tone for age. Extremities: Warm and well perfused. No clubbing, cyanosis.  1+ pitting edema bilaterally.  Neuro: Alert and oriented X 3. Psych: Answers questions appropriately.   Labs: Basic Metabolic Panel: Recent Labs    06/01/23 0847  NA 140  K 3.7  CL 100  CO2 32  GLUCOSE 116*  BUN 25*  CREATININE 0.93  CALCIUM 9.2   Liver Function Tests: No results for input(s): "AST", "ALT", "ALKPHOS", "BILITOT", "PROT", "ALBUMIN" in the last 72 hours. No results for input(s): "LIPASE", "AMYLASE" in the last 72 hours. CBC: Recent Labs    06/01/23 0847  WBC 7.9  NEUTROABS 6.9  HGB 14.0  HCT 43.1  MCV 103.1*  PLT 151   Cardiac Enzymes: Recent Labs    06/01/23 0847 06/01/23 1242   TROPONINIHS 13 16   BNP: No results for input(s): "BNP" in the last 72 hours. D-Dimer: No results for input(s): "DDIMER" in the last 72 hours. Hemoglobin A1C: No results for input(s): "HGBA1C" in the last 72 hours. Fasting Lipid Panel: No results for input(s): "CHOL", "HDL", "LDLCALC", "TRIG", "CHOLHDL", "LDLDIRECT" in the last 72 hours. Thyroid Function Tests: No results for input(s): "TSH", "T4TOTAL", "T3FREE", "THYROIDAB" in the last 72 hours.  Invalid input(s): "FREET3" Anemia Panel: No results for input(s): "VITAMINB12", "FOLATE", "FERRITIN", "TIBC", "IRON", "RETICCTPCT" in the last 72 hours.   Radiology: ECHOCARDIOGRAM COMPLETE  Result Date: 06/01/2023    ECHOCARDIOGRAM REPORT   Patient Name:   Terry Collins Date of Exam: 06/01/2023 Medical Rec #:  [id number]     Height:       74.0 in Accession #:    [id number]    Weight:       165.0 lb Date of Birth:  [birth year]     BSA:          2.003 m Patient Age:    87 years      BP:           149/91 mmHg Patient Gender: M             HR:           84 bpm. Exam Location:  [facility] Procedure: 2D Echo, Cardiac Doppler and Color Doppler Indications:     Aortic regurgitation I35.1  History:         Patient has no prior history of Echocardiogram examinations.                  Paroxysmal Afib, Aortic valve insufficiency.  Sonographer:     Cristela Blue Referring Phys:  [id number] Nayleah Gamel Diagnosing Phys: Mellody Drown Alluri IMPRESSIONS  1. Left ventricular ejection fraction, by estimation, is 50 to 55% with beat to beat variability. The left ventricle has normal function. The left ventricle has no regional wall motion abnormalities. There is moderate concentric left ventricular hypertrophy. Left ventricular diastolic parameters are indeterminate.  2. Right ventricular systolic function was not well visualized.  3. Right atrial size was mildly dilated.  4. The mitral valve is normal in  [facility] CARDIOLOGY CONSULT NOTE       Patient ID: Terry Collins MRN: [id number] DOB/AGE: 1936/07/31 87 y.o.  Admit date: 06/01/2023 Referring Physician Dr. Mikey College Primary Physician Dr. Marcello Fennel Primary Cardiologist Dr. Juliann Pares Reason for Consultation POC, atrial fibrillation  HPI: Terry Collins is a 87 y.o. male  with a past medical history of moderate AR, longstanding persistent atrial fibrillation s/p PVI 09/2017 (Pradaxa), carotid artery disease, hyperlipidemia, parkinson's who presented to the ED on 06/01/2023 for fall and hip pain. Found to be in atrial fibrillation with elevated rate. Cardiology was consulted for further evaluation.   Patient states that he was in his usual state of health until this morning when he was in his kitchen and lost his balance and fell on his left hip.  Denies any chest pain, palpitations, dizziness or loss of consciousness at time of the fall.  He immediately had pain in his hip and was brought to the ED by EMS for further evaluation.  Evaluation in the ED notable for x-ray with left femoral neck fracture.  Chest x-ray with interstitial edema vascular congestion.  Lab work notable for creatinine 0.93, potassium 3.7, hemoglobin 14.0.  Troponins trended 13 > 16.  EKG demonstrated atrial fibrillation.  He was noted to have elevated heart rate on telemetry.   Patient reports this afternoon that he is feeling okay.  States that his hip pain has improved since receiving pain medication.  States that otherwise he has been doing well recently.  He exercises 3 times per week by walking and doing resistance training.  [place] the [place] [facility] independently but has a caregiver who checks in on him daily and takes him to the gym and to the grocery store.  Denies any recent issues with chest pain or shortness of breath.  Denies any exertional symptoms with activity.  He has longstanding persistent atrial fibrillation for which he previously took dofetilide  but this was discontinued earlier this year.  He states that prior to admission he was not taking any medication for rate control.  Denies any significant palpitation symptoms.  Has chronic lower extremity edema which is overall improved from last year.  Caregiver present today reports that this is mildly worse today than it has been.  Review of systems complete and found to be negative unless listed above    Past Medical History:  Diagnosis Date   Anemia    Aortic valve insufficiency    Arthritis    hands   Atherosclerosis of aorta (HCC)    Carotid stenosis    History of kidney stones 2010   Mixed hyperlipidemia    Paroxysmal atrial fibrillation (HCC)    Venous insufficiency of both lower extremities     Past Surgical History:  Procedure Laterality Date   CARDIAC ELECTROPHYSIOLOGY STUDY AND ABLATION  2019   [facility]   CARDIOVERSION N/A 07/07/2021   Procedure: CARDIOVERSION;  Surgeon: Lamar Blinks, MD;  Location: [facility] ORS;  Service: Cardiovascular;  Laterality: N/A;   cardiovesion     EYE SURGERY Bilateral 2012   cataracts   HERNIA REPAIR Left 2008   inguinal   INGUINAL HERNIA REPAIR Right 06/24/2016   Procedure: HERNIA REPAIR INGUINAL ADULT;  Surgeon: Nadeen Landau, MD;  Location: [facility] ORS;  Service: General;  Laterality: Right;   TOTAL KNEE ARTHROPLASTY Right 07/16/2020   Procedure: RIGHT TOTAL KNEE ARTHROPLASTY;  Surgeon: Juanell Fairly, MD;  Location: [facility] ORS;  Service: Orthopedics;  Laterality: Right;   XI  [facility] CARDIOLOGY CONSULT NOTE       Patient ID: Terry Collins MRN: [id number] DOB/AGE: 1936/07/31 87 y.o.  Admit date: 06/01/2023 Referring Physician Dr. Mikey College Primary Physician Dr. Marcello Fennel Primary Cardiologist Dr. Juliann Pares Reason for Consultation POC, atrial fibrillation  HPI: Terry Collins is a 87 y.o. male  with a past medical history of moderate AR, longstanding persistent atrial fibrillation s/p PVI 09/2017 (Pradaxa), carotid artery disease, hyperlipidemia, parkinson's who presented to the ED on 06/01/2023 for fall and hip pain. Found to be in atrial fibrillation with elevated rate. Cardiology was consulted for further evaluation.   Patient states that he was in his usual state of health until this morning when he was in his kitchen and lost his balance and fell on his left hip.  Denies any chest pain, palpitations, dizziness or loss of consciousness at time of the fall.  He immediately had pain in his hip and was brought to the ED by EMS for further evaluation.  Evaluation in the ED notable for x-ray with left femoral neck fracture.  Chest x-ray with interstitial edema vascular congestion.  Lab work notable for creatinine 0.93, potassium 3.7, hemoglobin 14.0.  Troponins trended 13 > 16.  EKG demonstrated atrial fibrillation.  He was noted to have elevated heart rate on telemetry.   Patient reports this afternoon that he is feeling okay.  States that his hip pain has improved since receiving pain medication.  States that otherwise he has been doing well recently.  He exercises 3 times per week by walking and doing resistance training.  [place] [facility] independently but has a caregiver who checks in on him daily and takes him to the gym and to the grocery store.  Denies any recent issues with chest pain or shortness of breath.  Denies any exertional symptoms with activity.  He has longstanding persistent atrial fibrillation for which he previously took dofetilide  but this was discontinued earlier this year.  He states that prior to admission he was not taking any medication for rate control.  Denies any significant palpitation symptoms.  Has chronic lower extremity edema which is overall improved from last year.  Caregiver present today reports that this is mildly worse today than it has been.  Review of systems complete and found to be negative unless listed above    Past Medical History:  Diagnosis Date   Anemia    Aortic valve insufficiency    Arthritis    hands   Atherosclerosis of aorta (HCC)    Carotid stenosis    History of kidney stones 2010   Mixed hyperlipidemia    Paroxysmal atrial fibrillation (HCC)    Venous insufficiency of both lower extremities     Past Surgical History:  Procedure Laterality Date   CARDIAC ELECTROPHYSIOLOGY STUDY AND ABLATION  2019   [facility]   CARDIOVERSION N/A 07/07/2021   Procedure: CARDIOVERSION;  Surgeon: Lamar Blinks, MD;  Location: [facility] ORS;  Service: Cardiovascular;  Laterality: N/A;   cardiovesion     EYE SURGERY Bilateral 2012   cataracts   HERNIA REPAIR Left 2008   inguinal   INGUINAL HERNIA REPAIR Right 06/24/2016   Procedure: HERNIA REPAIR INGUINAL ADULT;  Surgeon: Nadeen Landau, MD;  Location: [facility] ORS;  Service: General;  Laterality: Right;   TOTAL KNEE ARTHROPLASTY Right 07/16/2020   Procedure: RIGHT TOTAL KNEE ARTHROPLASTY;  Surgeon: Juanell Fairly, MD;  Location: [facility] ORS;  Service: Orthopedics;  Laterality: Right;   XI  Oral  SpO2:  90% 90%   Weight:      Height:        PHYSICAL EXAM General: Chronically ill-appearing, well nourished, in no acute distress laying flat in ED stretcher. HEENT: Normocephalic and atraumatic. Neck: No JVD.  Lungs: Normal respiratory effort on room air. Clear bilaterally to auscultation. No wheezes, crackles, rhonchi.  Heart: Irregularly irregular, elevated rate. Normal S1 and S2 without gallops or murmurs.  Abdomen: Non-distended appearing.  Msk: Normal strength and tone for age. Extremities: Warm and well perfused. No clubbing, cyanosis.  1+ pitting edema bilaterally.  Neuro: Alert and oriented X 3. Psych: Answers questions appropriately.   Labs: Basic Metabolic Panel: Recent Labs    06/01/23 0847  NA 140  K 3.7  CL 100  CO2 32  GLUCOSE 116*  BUN 25*  CREATININE 0.93  CALCIUM 9.2   Liver Function Tests: No results for input(s): "AST", "ALT", "ALKPHOS", "BILITOT", "PROT", "ALBUMIN" in the last 72 hours. No results for input(s): "LIPASE", "AMYLASE" in the last 72 hours. CBC: Recent Labs    06/01/23 0847  WBC 7.9  NEUTROABS 6.9  HGB 14.0  HCT 43.1  MCV 103.1*  PLT 151   Cardiac Enzymes: Recent Labs    06/01/23 0847 06/01/23 1242   TROPONINIHS 13 16   BNP: No results for input(s): "BNP" in the last 72 hours. D-Dimer: No results for input(s): "DDIMER" in the last 72 hours. Hemoglobin A1C: No results for input(s): "HGBA1C" in the last 72 hours. Fasting Lipid Panel: No results for input(s): "CHOL", "HDL", "LDLCALC", "TRIG", "CHOLHDL", "LDLDIRECT" in the last 72 hours. Thyroid Function Tests: No results for input(s): "TSH", "T4TOTAL", "T3FREE", "THYROIDAB" in the last 72 hours.  Invalid input(s): "FREET3" Anemia Panel: No results for input(s): "VITAMINB12", "FOLATE", "FERRITIN", "TIBC", "IRON", "RETICCTPCT" in the last 72 hours.   Radiology: ECHOCARDIOGRAM COMPLETE  Result Date: 06/01/2023    ECHOCARDIOGRAM REPORT   Patient Name:   Terry Collins Date of Exam: 06/01/2023 Medical Rec #:  [id number]     Height:       74.0 in Accession #:    [id number]    Weight:       165.0 lb Date of Birth:  [birth year]     BSA:          2.003 m Patient Age:    87 years      BP:           149/91 mmHg Patient Gender: M             HR:           84 bpm. Exam Location:  [facility] Procedure: 2D Echo, Cardiac Doppler and Color Doppler Indications:     Aortic regurgitation I35.1  History:         Patient has no prior history of Echocardiogram examinations.                  Paroxysmal Afib, Aortic valve insufficiency.  Sonographer:     Cristela Blue Referring Phys:  [id number] Nayleah Gamel Diagnosing Phys: Mellody Drown Alluri IMPRESSIONS  1. Left ventricular ejection fraction, by estimation, is 50 to 55% with beat to beat variability. The left ventricle has normal function. The left ventricle has no regional wall motion abnormalities. There is moderate concentric left ventricular hypertrophy. Left ventricular diastolic parameters are indeterminate.  2. Right ventricular systolic function was not well visualized.  3. Right atrial size was mildly dilated.  4. The mitral valve is normal in

## 2023-06-01 NOTE — ED Notes (Signed)
Called lab at this time for an add on troponin. Lab will draw off of green top already sent.

## 2023-06-01 NOTE — ED Notes (Signed)
This NT found pt with nasal canula on the side rail. NT made RN aware and placed back on o2. Spo2 at 97%.

## 2023-06-01 NOTE — Consult Note (Signed)
PHARMACY - ANTICOAGULATION CONSULT NOTE  Pharmacy Consult for heparin infusion Indication: atrial fibrillation  Allergies  Allergen Reactions   Lactose Intolerance (Gi) Diarrhea    GI- upset   Tramadol Other (See Comments)    Confusion-hallucinations    Patient Measurements: Height: 6\' 2"  (188 cm) Weight: 74.8 kg (165 lb) IBW/kg (Calculated) : 82.2 Heparin Dosing Weight: 74.8 kg  Vital Signs: Temp: 98 F (36.7 C) (10/03 0841) Temp Source: Oral (10/03 0841) BP: 147/102 (10/03 1030) Pulse Rate: 99 (10/03 1030)  Labs: Recent Labs    06/01/23 0847  HGB 14.0  HCT 43.1  PLT 151  CREATININE 0.93  TROPONINIHS 13    Estimated Creatinine Clearance: 61.4 mL/min (by C-G formula based on SCr of 0.93 mg/dL).   Medical History: Past Medical History:  Diagnosis Date   Anemia    Aortic valve insufficiency    Arthritis    hands   Atherosclerosis of aorta (HCC)    Carotid stenosis    History of kidney stones 2010   Mixed hyperlipidemia    Paroxysmal atrial fibrillation (HCC)    Venous insufficiency of both lower extremities     Medications:  Patient on Pradaxa 150 mg po BID, last dose 06/01/23 @ 0700  Assessment: 87 yo male presented to the ED after fall.  Imaging revealed left femoral neck fracture.  Pharmacy consulted to initiate heparin infusion.   Goal of Therapy:  Heparin level 0.3-0.7 units/ml Monitor platelets by anticoagulation protocol: Yes   Plan:  No bolus given patient on Pradaxa Start heparin infusion at 1000 units/hr starting 06/01/23 @ 1930 (approximately 12 hours after last Pradaxa dose) Check HL 8 hours after initiation Daily CBC while on heparin  Barrie Folk, PharmD 06/01/2023,11:28 AM

## 2023-06-01 NOTE — Progress Notes (Signed)
*  PRELIMINARY RESULTS* Echocardiogram 2D Echocardiogram has been performed.  Cristela Blue 06/01/2023, 1:26 PM

## 2023-06-01 NOTE — ED Notes (Signed)
Lab called to add on APTT at this time.

## 2023-06-01 NOTE — TOC CM/SW Note (Signed)
CSW acknowledges SNF consult. Patient is a resident at [facility]. MD will consult PT and OT tomorrow after surgery. Will follow for recommendations.  Charlynn Court, CSW [phone]

## 2023-06-01 NOTE — ED Triage Notes (Signed)
Patient to ED via [facility] from [facility] [place] after a fall. Patient had a mechanical fall walking to room from breakfast. C/o left hip pain with shortening per EMS. Denies LOC but does take blood thinners. Aox4 Hx of parkinson

## 2023-06-01 NOTE — ED Notes (Signed)
Assisted pt with urinal. Attempt to remove pt shift and change depends brief, and old bed pads/linen but pt refused due to not wanting to be moved due to hip pain with movement.

## 2023-06-01 NOTE — ED Notes (Signed)
Assumed care of pt at this time. Pt is AAXO4, on CCM, VS stable and WNL. No needs identified at this time. Call light within reach.

## 2023-06-01 NOTE — Consult Note (Signed)
ORTHOPAEDIC CONSULTATION  REQUESTING PHYSICIAN: Emeline General, MD  Chief Complaint:   L hip pain  History of Present Illness: Terry Collins is a 87 y.o. male with history of a-fib on Pradaxa and Parkinson's, who had a fall earlier today. Last dose of Pradaxa was this AM. The patient noted immediate hip pain and inability to ambulate.  The patient ambulates with a walker at baseline. He lives at [facility] of [employer]. He is relatively active at baseline and exercises a few days per week. Pain is worse with any sort of movement. X-rays in the emergency department show a left displaced femoral neck fracture.  He was found to have RVR with underlying a-fib in ED. He was evaluated by the Cardiology team already. He has had prior R TKA by Dr. Martha Clan ~3 years ago.  Past Medical History:  Diagnosis Date   Anemia    Aortic valve insufficiency    Arthritis    hands   Atherosclerosis of aorta (HCC)    Carotid stenosis    History of kidney stones 2010   Mixed hyperlipidemia    Paroxysmal atrial fibrillation (HCC)    Venous insufficiency of both lower extremities    Past Surgical History:  Procedure Laterality Date   CARDIAC ELECTROPHYSIOLOGY STUDY AND ABLATION  2019   [facility]   CARDIOVERSION N/A 07/07/2021   Procedure: CARDIOVERSION;  Surgeon: Lamar Blinks, MD;  Location: [facility] ORS;  Service: Cardiovascular;  Laterality: N/A;   cardiovesion     EYE SURGERY Bilateral 2012   cataracts   HERNIA REPAIR Left 2008   inguinal   INGUINAL HERNIA REPAIR Right 06/24/2016   Procedure: HERNIA REPAIR INGUINAL ADULT;  Surgeon: Nadeen Landau, MD;  Location: [facility] ORS;  Service: General;  Laterality: Right;   TOTAL KNEE ARTHROPLASTY Right 07/16/2020   Procedure: RIGHT TOTAL KNEE ARTHROPLASTY;  Surgeon: Juanell Fairly, MD;  Location: [facility] ORS;  Service: Orthopedics;  Laterality: Right;   XI ROBOTIC ASSISTED  INGUINAL HERNIA REPAIR WITH MESH Bilateral 01/20/2020   Procedure: XI ROBOTIC ASSISTED INGUINAL HERNIA REPAIR WITH MESH;  Surgeon: Carolan Shiver, MD;  Location: [facility] ORS;  Service: General;  Laterality: Bilateral;   XI ROBOTIC ASSISTED INGUINAL HERNIA REPAIR WITH MESH Right 12/16/2020   Procedure: XI ROBOTIC ASSISTED INGUINAL HERNIA REPAIR WITH MESH;  Surgeon: Carolan Shiver, MD;  Location: [facility] ORS;  Service: General;  Laterality: Right;   Social History   Socioeconomic History   Marital status: Widowed    Spouse name: Not on file   Number of children: Not on file   Years of education: Not on file   Highest education level: Not on file  Occupational History   Not on file  Tobacco Use   Smoking status: Never   Smokeless tobacco: Never  Vaping Use   Vaping status: Not on file  Substance and Sexual Activity   Alcohol use: Yes    Alcohol/week: 1.0 standard drink of alcohol    Types: 1 Glasses of wine per week    Comment: glass of wine week   Drug use: No   Sexual activity: Yes  Other Topics Concern   Not on file  Social History Narrative   Not on file   Social Determinants of Health   Financial Resource Strain: Low Risk  (05/22/2023)   Received from [facility]   Overall Financial Resource Strain (CARDIA)    Difficulty of Paying Living Expenses: Not hard at all  Food Insecurity: No Food  Insecurity (05/22/2023)   Received from [facility]   Hunger Vital Sign    Worried About Running Out of Food in the Last Year: Never true    Ran Out of Food in the Last Year: Never true  Transportation Needs: No Transportation Needs (05/22/2023)   Received from [facility] - Transportation    In the past 12 months, has lack of transportation kept you from medical appointments or from getting medications?: No    Lack of Transportation (Non-Medical): No  Physical Activity: Insufficiently Active (12/21/2020)    Received from [facility]   Exercise Vital Sign    Days of Exercise per Week: 7 days    Minutes of Exercise per Session: 20 min  Stress: No Stress Concern Present (12/21/2020)   Received from [facility] - Occupational Stress Questionnaire    Feeling of Stress : Only a little  Social Connections: Unknown (12/21/2020)   Received from [facility]   Social Connection and Isolation Panel [NHANES]    Frequency of Communication with Friends and Family: More than three times a week    Frequency of Social Gatherings with Friends and Family: Once a week    Attends Religious Services: Not on Marketing executive or Organizations: No    Attends Banker Meetings: Never    Marital Status: Widowed   Family History  Problem Relation Age of Onset   Heart attack Father    Atrial fibrillation Brother    Allergies  Allergen Reactions   Lactose Intolerance (Gi) Diarrhea    GI- upset   Tramadol Other (See Comments)    Confusion-hallucinations   Prior to Admission medications   Medication Sig Start Date End Date Taking? Authorizing Provider  Calcium Polycarbophil (FIBER-CAPS PO) Take 3 capsules by mouth in the morning.   Yes [provider]  carbidopa-levodopa (SINEMET IR) 25-100 MG tablet Take 1-2 tablets by mouth 5 (five) times daily. Takes two tablets every morning and one tablet rest of day 07/11/22  Yes [provider]  cycloSPORINE (RESTASIS) 0.05 % ophthalmic emulsion Place 1 drop into both eyes 2 (two) times daily.   Yes [provider]  dabigatran (PRADAXA) 150 MG CAPS capsule Take 150 mg by mouth 2 (two) times daily.   Yes [provider]  ferrous sulfate 325 (65 FE) MG tablet Take 325 mg by mouth at bedtime.   Yes [provider]  folic acid (FOLVITE) 800 MCG tablet Take 800 mcg by mouth in the morning.   Yes [provider]  furosemide (LASIX) 40 MG tablet Take 40 mg by mouth daily. 11/14/22  Yes [provider]  gabapentin (NEURONTIN) 100 MG capsule Take 100 mg by mouth 2 (two) times daily. 05/31/22  Yes [provider]  metroNIDAZOLE (METROGEL) 1 % gel Apply 1 application topically at bedtime.    Yes [provider]  Multiple Vitamin (MULTIVITAMIN WITH MINERALS) TABS tablet Take 1 tablet by mouth in the morning. Centrum Silver   Yes [provider]  potassium chloride (MICRO-K) 10 MEQ CR capsule Take 10 mEq by mouth in the morning.   Yes [provider]  Sulfacetamide Sodium, Acne, 10 % LOTN Apply 1 Application topically 2 (two) times daily. 11/09/22  Yes [provider]  vitamin B-12 (CYANOCOBALAMIN) 1000 MCG tablet Take 1,000 mcg by mouth in the morning.  Yes [provider]   Recent Labs    06/01/23 0847  WBC 7.9  HGB 14.0  HCT 43.1  PLT 151  K 3.7  CL 100  CO2 32  BUN 25*  CREATININE 0.93  GLUCOSE 116*  CALCIUM 9.2  INR 1.4*   ECHOCARDIOGRAM COMPLETE  Result Date: 06/01/2023    ECHOCARDIOGRAM REPORT   Patient Name:   Terry Collins Date of Exam: 06/01/2023 Medical Rec #:  [id number]     Height:       74.0 in Accession #:    [id number]    Weight:       165.0 lb Date of Birth:  [birth year]     BSA:          2.003 m Patient Age:    87 years      BP:           149/91 mmHg Patient Gender: M             HR:           84 bpm. Exam Location:  [facility] Procedure: 2D Echo, Cardiac Doppler and Color Doppler Indications:     Aortic regurgitation I35.1  History:         Patient has no prior history of Echocardiogram examinations.                  Paroxysmal Afib, Aortic valve insufficiency.  Sonographer:     Cristela Blue Referring Phys:  [id number] CARALYN HUDSON Diagnosing Phys: Mellody Drown Alluri IMPRESSIONS  1. Left ventricular ejection fraction, by estimation, is 50 to 55% with beat to beat variability. The left ventricle has normal function. The left ventricle has  no regional wall motion abnormalities. There is moderate concentric left ventricular hypertrophy. Left ventricular diastolic parameters are indeterminate.  2. Right ventricular systolic function was not well visualized.  3. Right atrial size was mildly dilated.  4. The mitral valve is normal in structure. Trivial mitral valve regurgitation.  5. The aortic valve is tricuspid. There is mild thickening of the aortic valve. Aortic valve regurgitation is moderate to severe.  6. The inferior vena cava is dilated in size with <50% respiratory variability, suggesting right atrial pressure of 15 mmHg. FINDINGS  Left Ventricle: Left ventricular ejection fraction, by estimation, is 50 to 55%. The left ventricle has normal function. The left ventricle has no regional wall motion abnormalities. The left ventricular internal cavity size was normal in size. There is  moderate concentric left ventricular hypertrophy. Left ventricular diastolic parameters are indeterminate. Right Ventricle: Right vetricular wall thickness was not well visualized. Right ventricular systolic function was not well visualized. Left Atrium: Left atrial size was not well visualized. Right Atrium: Right atrial size was mildly dilated. Pericardium: There is no evidence of pericardial effusion. Mitral Valve: The mitral valve is normal in structure. There is mild thickening of the mitral valve leaflet(s). Trivial mitral valve regurgitation. Tricuspid Valve: The tricuspid valve is normal in structure. Tricuspid valve regurgitation is mild. Aortic Valve: The aortic valve is tricuspid. There is mild thickening of the aortic valve. Aortic valve regurgitation is moderate to severe. Aortic valve mean gradient measures 3.0 mmHg. Aortic valve peak gradient measures 6.0 mmHg. Aortic valve area, by  VTI measures 4.21 cm. Pulmonic Valve: The pulmonic valve was normal in structure. Pulmonic valve regurgitation is mild. Aorta: The aortic root and ascending aorta are  structurally normal, with no evidence of dilitation. Venous: The inferior vena cava is dilated in size with  less than 50% respiratory variability, suggesting right atrial pressure of 15 mmHg. IAS/Shunts: No atrial level shunt detected by color flow Doppler.  LEFT VENTRICLE PLAX 2D LVIDd:         2.90 cm LVIDs:         2.10 cm LV PW:         1.60 cm LV IVS:        1.70 cm LVOT diam:     2.30 cm LV SV:         66 LV SV Index:   33 LVOT Area:     4.15 cm  RIGHT VENTRICLE RV Basal diam:  3.80 cm RV Mid diam:    3.60 cm RV S prime:     8.16 cm/s TAPSE (M-mode): 1.8 cm LEFT ATRIUM           Index        RIGHT ATRIUM           Index LA diam:      3.00 cm 1.50 cm/m   RA Area:     26.30 cm LA Vol (A2C): 55.4 ml 27.66 ml/m  RA Volume:   75.90 ml  37.90 ml/m LA Vol (A4C): 48.7 ml 24.32 ml/m  AORTIC VALVE AV Area (Vmax):    3.15 cm AV Area (Vmean):   3.35 cm AV Area (VTI):     4.21 cm AV Vmax:           122.00 cm/s AV Vmean:          85.600 cm/s AV VTI:            0.158 m AV Peak Grad:      6.0 mmHg AV Mean Grad:      3.0 mmHg LVOT Vmax:         92.60 cm/s LVOT Vmean:        69.000 cm/s LVOT VTI:          0.160 m LVOT/AV VTI ratio: 1.01 AI PHT:            422 msec  AORTA Ao Root diam: 3.70 cm MITRAL VALVE               TRICUSPID VALVE MV Area (PHT): 6.37 cm    TR Peak grad:   12.0 mmHg MV Decel Time: 119 msec    TR Vmax:        173.00 cm/s MV E velocity: 89.10 cm/s                            SHUNTS                            Systemic VTI:  0.16 m                            Systemic Diam: 2.30 cm Windell Norfolk Electronically signed by Windell Norfolk Signature Date/Time: 06/01/2023/2:04:32 PM    Final    DG Hip Unilat With Pelvis 2-3 Views Left  Result Date: 06/01/2023 CLINICAL DATA:  Pain after fall EXAM: DG HIP (WITH OR WITHOUT PELVIS) 3V LEFT COMPARISON:  None Available. FINDINGS: Mildly displaced and angulated subcapital left femoral neck fracture. Osteopenia. Moderate joint space loss of the right hip greater  than left. Degenerative changes of the sacroiliac joints as well with some osteophytes. Chondrocalcinosis of the pubic symphysis.  Vascular calcifications. Degenerative changes of the visualized portions of the lumbar spine. IMPRESSION: Osteopenia with mildly displaced subcapital left femoral neck fracture. Degenerative changes of the right hip. Electronically Signed   By: Karen Kays M.D.   On: 06/01/2023 11:14   DG Chest 1 View  Result Date: 06/01/2023 CLINICAL DATA:  Pain after fall EXAM: CHEST  1 VIEW COMPARISON:  07/07/2020 FINDINGS: Enlarged cardiopericardial silhouette with vascular congestion and some interstitial edema. Apical pleural thickening. No pneumothorax. Question tiny left effusion. Overlapping cardiac leads. IMPRESSION: Enlarged heart with vascular congestion and some interstitial edema. Question tiny left effusion. Recommend follow-up Electronically Signed   By: Karen Kays M.D.   On: 06/01/2023 11:13     Positive ROS: All other systems have been reviewed and were otherwise negative with the exception of those mentioned in the HPI and as above.  Physical Exam: BP 128/76   Pulse 97   Temp 98.4 F (36.9 C) (Oral)   Resp 10   Ht 6\' 2"  (1.88 m)   Wt 74.8 kg   SpO2 90%   BMI 21.18 kg/m  General:  Alert, no acute distress Psychiatric:  Patient is competent for consent with normal mood and affect    Orthopedic Exam:  LLE: + DF/PF/EHL SILT grossly over foot Foot wwp +Log roll/axial load Leg shortened and externally rotated   Imaging:  As above: L displaced femoral neck fracture  Assessment/Plan: Terry Collins is a 87 y.o. male with a L displaced femoral neck fracture  1. I discussed the various treatment options including both surgical and non-surgical management of the fracture with the patient. We discussed the high risk of perioperative complications due to patient's age and other co-morbidities. After discussion of risks, benefits, and alternatives to surgery,  the family and/or patient were in agreement to proceed with surgery. The goals of surgery would be to provide adequate pain relief and allow for mobilization. Plan for surgery is L hip hemiarthroplasty tomorrow, 06/02/23 ~1230pm. 2. NPO after midnight 3. Hold anticoagulation in advance of OR    Signa Kell   06/01/2023 2:45 PM

## 2023-06-01 NOTE — ED Notes (Signed)
Pt assisted with urinal at this time.  

## 2023-06-01 NOTE — H&P (Signed)
History and Physical    Terry Collins KZS:[id number] DOB: July 20, 1936 DOA: 06/01/2023  PCP: Barbette Reichmann, MD (Confirm with patient/family/NH records and if not entered, this has to be entered at [facility] point of entry) Patient coming from: Assisted living  I have personally briefly reviewed patient's old medical records in [facility]  Chief Complaint: I fell and broke my hip  HPI: Terry Collins is a 87 y.o. male with medical history significant of PAF on Pradaxa, Parkinson's disease, moderate aortic valve regurgitation, HLD, chronic ambulation impairment, presented with fall and left hip pain.  Patient has Parkinson's disease and at baseline uses roller walker to ambulate, according to caregiver patient usually is steady.  As per caregiver, patient has excellent exercise tolerance he goes to gym 3 times a week and able to use the treadmill for 15+ minutes each occasions and never complained about chest pain or shortness of breath.  Chronically patient has history of PAF, for which he underwent ablation in 2019 and he used to be on Tikosyn which was discontinued sometime this year by cardiology.  And patient continue to take Pradaxa for anticoagulation and denies any bleeding.  This morning, patient fell while turning to kitchen countertop to reach coffee cup and lost his balance and fell on the left hip, he denied any prodrome of lightheadedness chest pain shortness of breath blurry vision or palpitations and he did not lose consciousness.  Facility staff were able to hold him up and put him in the bed however patient continued to complain left hip pain and caregiver decided to call EMS.  ED Course: Afebrile, heart rate in the range of 90s-130s in rapid A-fib, blood pressure elevated nonhypoxic.  Hip x-ray showed mild displaced subcapital left femoral neck fracture blood work showed K3.7, bicarb 32, creatinine 0.9, hemoglobin 14, WBC 7.9.  Review of Systems: As per HPI otherwise 14 point  review of systems negative.    Past Medical History:  Diagnosis Date   Anemia    Aortic valve insufficiency    Arthritis    hands   Atherosclerosis of aorta (HCC)    Carotid stenosis    History of kidney stones 2010   Mixed hyperlipidemia    Paroxysmal atrial fibrillation (HCC)    Venous insufficiency of both lower extremities     Past Surgical History:  Procedure Laterality Date   CARDIAC ELECTROPHYSIOLOGY STUDY AND ABLATION  2019   [facility]   CARDIOVERSION N/A 07/07/2021   Procedure: CARDIOVERSION;  Surgeon: Lamar Blinks, MD;  Location: [facility] ORS;  Service: Cardiovascular;  Laterality: N/A;   cardiovesion     EYE SURGERY Bilateral 2012   cataracts   HERNIA REPAIR Left 2008   inguinal   INGUINAL HERNIA REPAIR Right 06/24/2016   Procedure: HERNIA REPAIR INGUINAL ADULT;  Surgeon: Nadeen Landau, MD;  Location: [facility] ORS;  Service: General;  Laterality: Right;   TOTAL KNEE ARTHROPLASTY Right 07/16/2020   Procedure: RIGHT TOTAL KNEE ARTHROPLASTY;  Surgeon: Juanell Fairly, MD;  Location: [facility] ORS;  Service: Orthopedics;  Laterality: Right;   XI ROBOTIC ASSISTED INGUINAL HERNIA REPAIR WITH MESH Bilateral 01/20/2020   Procedure: XI ROBOTIC ASSISTED INGUINAL HERNIA REPAIR WITH MESH;  Surgeon: Carolan Shiver, MD;  Location: [facility] ORS;  Service: General;  Laterality: Bilateral;   XI ROBOTIC ASSISTED INGUINAL HERNIA REPAIR WITH MESH Right 12/16/2020   Procedure: XI ROBOTIC ASSISTED INGUINAL HERNIA REPAIR WITH MESH;  Surgeon: Carolan Shiver, MD;  Location: [facility] ORS;  Service: General;  Laterality: Right;  reports that he has never smoked. He has never used smokeless tobacco. He reports current alcohol use of about 1.0 standard drink of alcohol per week. He reports that he does not use drugs.  Allergies  Allergen Reactions   Lactose Intolerance (Gi) Diarrhea    GI- upset   Tramadol Other (See Comments)    Confusion-hallucinations    Family History  Problem  Relation Age of Onset   Heart attack Father    Atrial fibrillation Brother     Prior to Admission medications   Medication Sig Start Date End Date Taking? Authorizing Provider  dabigatran (PRADAXA) 150 MG CAPS capsule Take 150 mg by mouth 2 (two) times daily.   Yes [provider]  Calcium Polycarbophil (FIBER-CAPS PO) Take 3 capsules by mouth in the morning.    [provider]  cycloSPORINE (RESTASIS) 0.05 % ophthalmic emulsion Place 1 drop into both eyes 2 (two) times daily.    [provider]  dofetilide (TIKOSYN) 125 MCG capsule Take 125 mcg by mouth in the morning and at bedtime. Patient not taking: Reported on 06/01/2023 12/14/19   [provider]  ferrous sulfate 325 (65 FE) MG tablet Take 325 mg by mouth in the morning.    [provider]  folic acid (FOLVITE) 800 MCG tablet Take 800 mcg by mouth in the morning.    [provider]  HYDROcodone-acetaminophen (NORCO/VICODIN) 5-325 MG tablet Take 1 tablet by mouth every 6 (six) hours as needed for up to 10 doses for moderate pain. 07/15/21   Varney Daily, PA  metroNIDAZOLE (METROGEL) 1 % gel Apply 1 application topically at bedtime.     [provider]  Multiple Vitamin (MULTIVITAMIN WITH MINERALS) TABS tablet Take 1 tablet by mouth in the morning. Centrum Silver    [provider]  potassium chloride (MICRO-K) 10 MEQ CR capsule Take 10 mEq by mouth in the morning.    [provider]  vitamin B-12 (CYANOCOBALAMIN) 1000 MCG tablet Take 1,000 mcg by mouth in the morning.    [provider]    Physical Exam: Vitals:   06/01/23 0839 06/01/23 0841 06/01/23 1030  BP:  (!) 168/95 (!) 147/102  Pulse:  73 99  Resp:  15 20  Temp:  98 F (36.7 C)   TempSrc:  Oral   SpO2:  100% 94%  Weight: 74.8 kg    Height: 6\' 2"  (1.88 m)      Constitutional: NAD, calm, comfortable Vitals:   06/01/23 0839 06/01/23 0841 06/01/23 1030  BP:  (!) 168/95 (!)  147/102  Pulse:  73 99  Resp:  15 20  Temp:  98 F (36.7 C)   TempSrc:  Oral   SpO2:  100% 94%  Weight: 74.8 kg    Height: 6\' 2"  (1.88 m)     Eyes: PERRL, lids and conjunctivae normal ENMT: Mucous membranes are moist. Posterior pharynx clear of any exudate or lesions.Normal dentition.  Neck: normal, supple, no masses, no thyromegaly Respiratory: clear to auscultation bilaterally, no wheezing, no crackles. Normal respiratory effort. No accessory muscle use.  Cardiovascular: Regular rate and rhythm, no murmurs / rubs / gallops. No extremity edema. 2+ pedal pulses. No carotid bruits.  Abdomen: no tenderness, no masses palpated. No hepatosplenomegaly. Bowel sounds positive.  Musculoskeletal: Left leg shortened and rotated Skin: no rashes, lesions, ulcers. No induration Neurologic: CN 2-12 grossly intact. Sensation intact, DTR normal. Strength 5/5 in all 4.  Psychiatric: Normal judgment and insight. Alert and  oriented x 3. Normal mood.    Labs on Admission: I have personally reviewed following labs and imaging studies  CBC: Recent Labs  Lab 06/01/23 0847  WBC 7.9  NEUTROABS 6.9  HGB 14.0  HCT 43.1  MCV 103.1*  PLT 151   Basic Metabolic Panel: Recent Labs  Lab 06/01/23 0847  NA 140  K 3.7  CL 100  CO2 32  GLUCOSE 116*  BUN 25*  CREATININE 0.93  CALCIUM 9.2   GFR: Estimated Creatinine Clearance: 61.4 mL/min (by C-G formula based on SCr of 0.93 mg/dL). Liver Function Tests: No results for input(s): "AST", "ALT", "ALKPHOS", "BILITOT", "PROT", "ALBUMIN" in the last 168 hours. No results for input(s): "LIPASE", "AMYLASE" in the last 168 hours. No results for input(s): "AMMONIA" in the last 168 hours. Coagulation Profile: No results for input(s): "INR", "PROTIME" in the last 168 hours. Cardiac Enzymes: No results for input(s): "CKTOTAL", "CKMB", "CKMBINDEX", "TROPONINI" in the last 168 hours. BNP (last 3 results) No results for input(s): "PROBNP" in the last 8760  hours. HbA1C: No results for input(s): "HGBA1C" in the last 72 hours. CBG: No results for input(s): "GLUCAP" in the last 168 hours. Lipid Profile: No results for input(s): "CHOL", "HDL", "LDLCALC", "TRIG", "CHOLHDL", "LDLDIRECT" in the last 72 hours. Thyroid Function Tests: No results for input(s): "TSH", "T4TOTAL", "FREET4", "T3FREE", "THYROIDAB" in the last 72 hours. Anemia Panel: No results for input(s): "VITAMINB12", "FOLATE", "FERRITIN", "TIBC", "IRON", "RETICCTPCT" in the last 72 hours. Urine analysis:    Component Value Date/Time   COLORURINE YELLOW 08/12/2021 0228   APPEARANCEUR CLEAR 08/12/2021 0228   LABSPEC 1.025 08/12/2021 0228   PHURINE 6.5 08/12/2021 0228   GLUCOSEU NEGATIVE 08/12/2021 0228   HGBUR MODERATE (A) 08/12/2021 0228   BILIRUBINUR NEGATIVE 08/12/2021 0228   KETONESUR NEGATIVE 08/12/2021 0228   PROTEINUR NEGATIVE 08/12/2021 0228   NITRITE NEGATIVE 08/12/2021 0228   LEUKOCYTESUR NEGATIVE 08/12/2021 0228    Radiological Exams on Admission: DG Hip Unilat With Pelvis 2-3 Views Left  Result Date: 06/01/2023 CLINICAL DATA:  Pain after fall EXAM: DG HIP (WITH OR WITHOUT PELVIS) 3V LEFT COMPARISON:  None Available. FINDINGS: Mildly displaced and angulated subcapital left femoral neck fracture. Osteopenia. Moderate joint space loss of the right hip greater than left. Degenerative changes of the sacroiliac joints as well with some osteophytes. Chondrocalcinosis of the pubic symphysis. Vascular calcifications. Degenerative changes of the visualized portions of the lumbar spine. IMPRESSION: Osteopenia with mildly displaced subcapital left femoral neck fracture. Degenerative changes of the right hip. Electronically Signed   By: Karen Kays M.D.   On: 06/01/2023 11:14   DG Chest 1 View  Result Date: 06/01/2023 CLINICAL DATA:  Pain after fall EXAM: CHEST  1 VIEW COMPARISON:  07/07/2020 FINDINGS: Enlarged cardiopericardial silhouette with vascular congestion and some  interstitial edema. Apical pleural thickening. No pneumothorax. Question tiny left effusion. Overlapping cardiac leads. IMPRESSION: Enlarged heart with vascular congestion and some interstitial edema. Question tiny left effusion. Recommend follow-up Electronically Signed   By: Karen Kays M.D.   On: 06/01/2023 11:13    EKG: Independently reviewed.  A-fib with RVR, ST depression on V4-V6  Assessment/Plan Principal Problem:   Hip fracture (HCC) Active Problems:   Closed left hip fracture (HCC)   Atrial fibrillation with RVR (HCC)   Parkinson disease (HCC)  (please populate well all problems here in Problem List. (For example, if patient is on BP meds at home and you resume or decide to hold them, it is a problem  that needs to be her. Same for CAD, COPD, HLD and so on)  Left femoral neck fracture -ORIF tomorrow.  At baseline, patient able to tolerate 4 METs activity without difficulty, likely can tolerate ORIF with generalized anesthesia however retinyl patient is in rapid A-fib, plan to manage as below. -Hold off Pradaxa, start heparin drip for anticoagulation -Pain control  A-fib with RVR -Off Tikosyn several months ago.  Currently will start metoprolol family current every 4 hours as needed for breakthrough heart rate -Sent text message to patient's cardiology at [facility] cardiology and waiting for reply -Anticoagulation changed to heparin drip  Abnormal EKG -ST depression on lateral leads, no chest pains.  Cardiology made aware -Plan to check troponin x 2 to rule out ACS -Echocardiogram  History of moderate AR Chronic HFpEF -Rather asymptomatic and able to tolerate 4 METS activity without difficulty -Patient appears to be euvolemic, will hold off Lasix for now  Parkinson's disease -Continue Sinemet  DVT prophylaxis: Heparin drip Code Status: Full code Family Communication: Caregiver at bedside Disposition Plan: Patient is sick with left hip fracture requiring ORIF and rapid  A-fib requiring inpatient rate control medications, expect more than 2 midnight hospital stay Consults called: Orthopedic surgery, cardiology. Admission status: Telemetry admission  Emeline General MD [facility] Pager [phone]  06/01/2023, 11:30 AM

## 2023-06-02 ENCOUNTER — Inpatient Hospital Stay: Payer: Medicare Other

## 2023-06-02 ENCOUNTER — Encounter
Admission: EM | Disposition: A | Discharge status: Skilled Nursing Facility | Payer: Self-pay | Source: Skilled Nursing Facility | Attending: Internal Medicine

## 2023-06-02 ENCOUNTER — Other Ambulatory Visit: Payer: Self-pay

## 2023-06-02 ENCOUNTER — Encounter: Payer: Self-pay | Admitting: Internal Medicine

## 2023-06-02 ENCOUNTER — Inpatient Hospital Stay: Payer: Medicare Other | Admitting: Anesthesiology

## 2023-06-02 DIAGNOSIS — S72002A Fracture of unspecified part of neck of left femur, initial encounter for closed fracture: Secondary | ICD-10-CM | POA: Diagnosis not present

## 2023-06-02 HISTORY — PX: HIP ARTHROPLASTY: SHX981

## 2023-06-02 LAB — BASIC METABOLIC PANEL
Anion gap: 11 (ref 5–15)
BUN: 26 mg/dL — ABNORMAL HIGH (ref 8–23)
CO2: 27 mmol/L (ref 22–32)
Calcium: 8.5 mg/dL — ABNORMAL LOW (ref 8.9–10.3)
Chloride: 101 mmol/L (ref 98–111)
Creatinine, Ser: 0.89 mg/dL (ref 0.61–1.24)
GFR, Estimated: >60 mL/min (ref >60)
Glucose, Bld: 100 mg/dL — ABNORMAL HIGH (ref 70–99)
Potassium: 3.7 mmol/L (ref 3.5–5.1)
Sodium: 139 mmol/L (ref 135–145)

## 2023-06-02 LAB — CBC
HCT: 35.3 % — ABNORMAL LOW (ref 39.0–52.0)
Hemoglobin: 12 g/dL — ABNORMAL LOW (ref 13.0–17.0)
MCH: 33.8 pg (ref 26.0–34.0)
MCHC: 34 g/dL (ref 30.0–36.0)
MCV: 99.4 fL (ref 80.0–100.0)
Platelets: 135 10*3/uL — ABNORMAL LOW (ref 150–400)
RBC: 3.55 MIL/uL — ABNORMAL LOW (ref 4.22–5.81)
RDW: 14.3 % (ref 11.5–15.5)
WBC: 7.6 10*3/uL (ref 4.0–10.5)
nRBC: 0 % (ref 0.0–0.2)

## 2023-06-02 LAB — SURGICAL PCR SCREEN
MRSA, PCR: NEGATIVE
Staphylococcus aureus: NEGATIVE

## 2023-06-02 LAB — HEPARIN LEVEL (UNFRACTIONATED): Heparin Unfractionated: 0.19 [IU]/mL — ABNORMAL LOW (ref 0.30–0.70)

## 2023-06-02 SURGERY — HEMIARTHROPLASTY, HIP, DIRECT ANTERIOR APPROACH, FOR FRACTURE
Anesthesia: General | Site: Hip | Laterality: Left

## 2023-06-02 MED ORDER — TRANEXAMIC ACID-NACL 1000-0.7 MG/100ML-% IV SOLN
INTRAVENOUS | Status: AC
  Filled 2023-06-02: qty 100

## 2023-06-02 MED ORDER — FENTANYL CITRATE (PF) 100 MCG/2ML IJ SOLN
INTRAMUSCULAR | Status: AC
  Filled 2023-06-02: qty 2

## 2023-06-02 MED ORDER — ROCURONIUM BROMIDE 100 MG/10ML IV SOLN
INTRAVENOUS | Status: DC | PRN
  Administered 2023-06-02: 50 mg via INTRAVENOUS

## 2023-06-02 MED ORDER — DABIGATRAN ETEXILATE MESYLATE 150 MG PO CAPS
150.0000 mg | ORAL_CAPSULE | Freq: Two times a day (BID) | ORAL | Status: DC
  Administered 2023-06-03 – 2023-06-05 (×5): 150 mg via ORAL
  Filled 2023-06-02 (×6): qty 1

## 2023-06-02 MED ORDER — MENTHOL 3 MG MT LOZG: 1.0000 | LOZENGE | OROMUCOSAL | Status: DC | PRN

## 2023-06-02 MED ORDER — SUGAMMADEX SODIUM 200 MG/2ML IV SOLN
INTRAVENOUS | Status: DC | PRN
  Administered 2023-06-02: 200 mg via INTRAVENOUS

## 2023-06-02 MED ORDER — METOCLOPRAMIDE HCL 5 MG/ML IJ SOLN: 5.0000 mg | Freq: Three times a day (TID) | INTRAMUSCULAR | Status: DC | PRN

## 2023-06-02 MED ORDER — OXYCODONE HCL 5 MG PO TABS: 5.0000 mg | ORAL_TABLET | Freq: Once | ORAL | Status: DC | PRN

## 2023-06-02 MED ORDER — PROPOFOL 10 MG/ML IV BOLUS
INTRAVENOUS | Status: DC | PRN
  Administered 2023-06-02: 100 ug/kg/min via INTRAVENOUS
  Administered 2023-06-02: 120 mg via INTRAVENOUS

## 2023-06-02 MED ORDER — DEXAMETHASONE SODIUM PHOSPHATE 10 MG/ML IJ SOLN
INTRAMUSCULAR | Status: AC
  Filled 2023-06-02: qty 1

## 2023-06-02 MED ORDER — PHENOL 1.4 % MT LIQD: 1.0000 | OROMUCOSAL | Status: DC | PRN

## 2023-06-02 MED ORDER — OXYCODONE HCL 5 MG/5ML PO SOLN: 5.0000 mg | Freq: Once | ORAL | Status: DC | PRN

## 2023-06-02 MED ORDER — KETOROLAC TROMETHAMINE 15 MG/ML IJ SOLN
7.5000 mg | Freq: Four times a day (QID) | INTRAMUSCULAR | Status: AC
  Administered 2023-06-02 – 2023-06-03 (×4): 7.5 mg via INTRAVENOUS
  Filled 2023-06-02 (×4): qty 1

## 2023-06-02 MED ORDER — PROPOFOL 10 MG/ML IV BOLUS
INTRAVENOUS | Status: AC
  Filled 2023-06-02: qty 20

## 2023-06-02 MED ORDER — SENNOSIDES-DOCUSATE SODIUM 8.6-50 MG PO TABS: 1.0000 | ORAL_TABLET | Freq: Every evening | ORAL | Status: DC | PRN

## 2023-06-02 MED ORDER — TRANEXAMIC ACID-NACL 1000-0.7 MG/100ML-% IV SOLN
INTRAVENOUS | Status: DC | PRN
Start: 2023-06-02 — End: 2023-06-02
  Administered 2023-06-02: 1000 mg via INTRAVENOUS

## 2023-06-02 MED ORDER — PHENYLEPHRINE 80 MCG/ML (10ML) SYRINGE FOR IV PUSH (FOR BLOOD PRESSURE SUPPORT)
PREFILLED_SYRINGE | INTRAVENOUS | Status: AC
  Filled 2023-06-02: qty 10

## 2023-06-02 MED ORDER — LACTATED RINGERS IV SOLN: INTRAVENOUS | Status: DC | PRN

## 2023-06-02 MED ORDER — FENTANYL CITRATE (PF) 100 MCG/2ML IJ SOLN: 25.0000 ug | INTRAMUSCULAR | Status: DC | PRN

## 2023-06-02 MED ORDER — METOPROLOL TARTRATE 50 MG PO TABS
50.0000 mg | ORAL_TABLET | Freq: Two times a day (BID) | ORAL | Status: DC
  Administered 2023-06-02 – 2023-06-05 (×6): 50 mg via ORAL
  Filled 2023-06-02 (×6): qty 1

## 2023-06-02 MED ORDER — METOCLOPRAMIDE HCL 5 MG PO TABS: 5.0000 mg | ORAL_TABLET | Freq: Three times a day (TID) | ORAL | Status: DC | PRN

## 2023-06-02 MED ORDER — FLEET ENEMA RE ENEM: 1.0000 | ENEMA | Freq: Once | RECTAL | Status: DC | PRN

## 2023-06-02 MED ORDER — MUPIROCIN 2 % EX OINT
1.0000 | TOPICAL_OINTMENT | Freq: Two times a day (BID) | CUTANEOUS | Status: DC
  Filled 2023-06-02: qty 22

## 2023-06-02 MED ORDER — ONDANSETRON HCL 4 MG PO TABS: 4.0000 mg | ORAL_TABLET | Freq: Four times a day (QID) | ORAL | Status: DC | PRN

## 2023-06-02 MED ORDER — ENSURE ENLIVE PO LIQD
237.0000 mL | Freq: Three times a day (TID) | ORAL | Status: DC
  Administered 2023-06-03 – 2023-06-05 (×6): 237 mL via ORAL
  Filled 2023-06-02: qty 237

## 2023-06-02 MED ORDER — ACETAMINOPHEN 10 MG/ML IV SOLN
INTRAVENOUS | Status: DC | PRN
  Administered 2023-06-02: 1000 mg via INTRAVENOUS

## 2023-06-02 MED ORDER — HYDROMORPHONE HCL 1 MG/ML IJ SOLN: 0.2000 mg | INTRAMUSCULAR | Status: DC | PRN

## 2023-06-02 MED ORDER — BUPIVACAINE LIPOSOME 1.3 % IJ SUSP
INTRAMUSCULAR | Status: DC | PRN
  Administered 2023-06-02: 50 mL

## 2023-06-02 MED ORDER — BUPIVACAINE LIPOSOME 1.3 % IJ SUSP
INTRAMUSCULAR | Status: AC
  Filled 2023-06-02: qty 20

## 2023-06-02 MED ORDER — DOCUSATE SODIUM 100 MG PO CAPS
100.0000 mg | ORAL_CAPSULE | Freq: Two times a day (BID) | ORAL | Status: DC
  Administered 2023-06-02 – 2023-06-05 (×5): 100 mg via ORAL
  Filled 2023-06-02 (×5): qty 1

## 2023-06-02 MED ORDER — FENTANYL CITRATE (PF) 100 MCG/2ML IJ SOLN
INTRAMUSCULAR | Status: DC | PRN
  Administered 2023-06-02 (×2): 50 ug via INTRAVENOUS

## 2023-06-02 MED ORDER — PROPOFOL 1000 MG/100ML IV EMUL
INTRAVENOUS | Status: AC
  Filled 2023-06-02: qty 100

## 2023-06-02 MED ORDER — BUPIVACAINE HCL (PF) 0.5 % IJ SOLN
INTRAMUSCULAR | Status: AC
  Filled 2023-06-02: qty 30

## 2023-06-02 MED ORDER — HEPARIN BOLUS VIA INFUSION
2200.0000 [IU] | Freq: Once | INTRAVENOUS | Status: AC
  Administered 2023-06-02: 2200 [IU] via INTRAVENOUS
  Filled 2023-06-02: qty 2200

## 2023-06-02 MED ORDER — 0.9 % SODIUM CHLORIDE (POUR BTL) OPTIME
TOPICAL | Status: DC | PRN
  Administered 2023-06-02: 500 mL

## 2023-06-02 MED ORDER — PHENYLEPHRINE 80 MCG/ML (10ML) SYRINGE FOR IV PUSH (FOR BLOOD PRESSURE SUPPORT)
PREFILLED_SYRINGE | INTRAVENOUS | Status: DC | PRN
Start: 2023-06-02 — End: 2023-06-02
  Administered 2023-06-02 (×4): 80 ug via INTRAVENOUS

## 2023-06-02 MED ORDER — ADULT MULTIVITAMIN W/MINERALS CH
1.0000 | ORAL_TABLET | Freq: Every day | ORAL | Status: DC
  Administered 2023-06-03 – 2023-06-05 (×3): 1 via ORAL
  Filled 2023-06-02 (×3): qty 1

## 2023-06-02 MED ORDER — SODIUM CHLORIDE 0.9 % IR SOLN
Status: DC | PRN
Start: 2023-06-02 — End: 2023-06-02
  Administered 2023-06-02: 3000 mL

## 2023-06-02 MED ORDER — ONDANSETRON HCL 4 MG/2ML IJ SOLN: 4.0000 mg | Freq: Four times a day (QID) | INTRAMUSCULAR | Status: DC | PRN

## 2023-06-02 MED ORDER — SODIUM CHLORIDE 0.9 % IV SOLN: INTRAVENOUS | Status: DC

## 2023-06-02 MED ORDER — ACETAMINOPHEN 10 MG/ML IV SOLN
INTRAVENOUS | Status: AC
  Filled 2023-06-02: qty 100

## 2023-06-02 MED ORDER — ONDANSETRON HCL 4 MG/2ML IJ SOLN
INTRAMUSCULAR | Status: DC | PRN
  Administered 2023-06-02: 4 mg via INTRAVENOUS

## 2023-06-02 MED ORDER — ACETAMINOPHEN 500 MG PO TABS
1000.0000 mg | ORAL_TABLET | Freq: Three times a day (TID) | ORAL | Status: DC
  Administered 2023-06-02 – 2023-06-05 (×8): 1000 mg via ORAL
  Filled 2023-06-02 (×10): qty 2

## 2023-06-02 MED ORDER — LIDOCAINE HCL (CARDIAC) PF 100 MG/5ML IV SOSY
PREFILLED_SYRINGE | INTRAVENOUS | Status: DC | PRN
  Administered 2023-06-02: 60 mg via INTRAVENOUS

## 2023-06-02 MED ORDER — LIDOCAINE HCL (PF) 2 % IJ SOLN
INTRAMUSCULAR | Status: AC
  Filled 2023-06-02: qty 5

## 2023-06-02 MED ORDER — OXYCODONE HCL 5 MG PO TABS: 5.0000 mg | ORAL_TABLET | ORAL | Status: DC | PRN

## 2023-06-02 MED ORDER — EPINEPHRINE PF 1 MG/ML IJ SOLN
INTRAMUSCULAR | Status: AC
  Filled 2023-06-02: qty 1

## 2023-06-02 MED ORDER — SENNA 8.6 MG PO TABS
1.0000 | ORAL_TABLET | Freq: Two times a day (BID) | ORAL | Status: DC
  Administered 2023-06-02 – 2023-06-05 (×5): 8.6 mg via ORAL
  Filled 2023-06-02 (×5): qty 1

## 2023-06-02 MED ORDER — ONDANSETRON HCL 4 MG/2ML IJ SOLN
INTRAMUSCULAR | Status: AC
  Filled 2023-06-02: qty 2

## 2023-06-02 MED ORDER — SODIUM CHLORIDE (PF) 0.9 % IJ SOLN
INTRAMUSCULAR | Status: AC
  Filled 2023-06-02: qty 50

## 2023-06-02 MED ORDER — BISACODYL 10 MG RE SUPP: 10.0000 mg | Freq: Every day | RECTAL | Status: DC | PRN

## 2023-06-02 MED ORDER — DEXAMETHASONE SODIUM PHOSPHATE 10 MG/ML IJ SOLN
INTRAMUSCULAR | Status: DC | PRN
  Administered 2023-06-02: 10 mg via INTRAVENOUS

## 2023-06-02 MED ORDER — CEFAZOLIN SODIUM-DEXTROSE 2-4 GM/100ML-% IV SOLN
INTRAVENOUS | Status: AC
  Filled 2023-06-02: qty 100

## 2023-06-02 MED ORDER — OXYCODONE HCL 5 MG PO TABS
2.5000 mg | ORAL_TABLET | ORAL | Status: DC | PRN
  Filled 2023-06-02: qty 1

## 2023-06-02 MED ORDER — CEFAZOLIN SODIUM-DEXTROSE 2-4 GM/100ML-% IV SOLN
2.0000 g | Freq: Four times a day (QID) | INTRAVENOUS | Status: AC
  Administered 2023-06-02 – 2023-06-03 (×2): 2 g via INTRAVENOUS
  Filled 2023-06-02 (×2): qty 100

## 2023-06-02 MED ORDER — ROCURONIUM BROMIDE 10 MG/ML (PF) SYRINGE
PREFILLED_SYRINGE | INTRAVENOUS | Status: AC
  Filled 2023-06-02: qty 10

## 2023-06-02 SURGICAL SUPPLY — 70 items
ADH SKN CLS APL DERMABOND .7 (GAUZE/BANDAGES/DRESSINGS) ×1
APL PRP STRL LF DISP 70% ISPRP (MISCELLANEOUS) ×2
BLADE SAW SGTL 13X75X1.27 (BLADE) ×1 IMPLANT
BNDG CMPR 5X4 CHSV STRCH STRL (GAUZE/BANDAGES/DRESSINGS) ×1
BNDG COHESIVE 4X5 TAN STRL LF (GAUZE/BANDAGES/DRESSINGS) ×1 IMPLANT
BOWL CEMENT MIXING ADV NOZZLE (MISCELLANEOUS) ×1 IMPLANT
CEMENT BONE 10-PACK (Cement) ×2 IMPLANT
CHLORAPREP W/TINT 26 (MISCELLANEOUS) ×2 IMPLANT
COVER MAYO STAND STRL (DRAPES) ×1 IMPLANT
DERMABOND ADVANCED .7 DNX12 (GAUZE/BANDAGES/DRESSINGS) ×1 IMPLANT
DRAPE INCISE IOBAN 66X60 STRL (DRAPES) ×1 IMPLANT
DRAPE ORTHO SPLIT 77X108 STRL (DRAPES) ×1
DRAPE SHEET LG 3/4 BI-LAMINATE (DRAPES) ×1 IMPLANT
DRAPE SURG ORHT 6 SPLT 77X108 (DRAPES) ×1 IMPLANT
DRAPE TABLE BACK 80X90 (DRAPES) ×1 IMPLANT
DRAPE U-SHAPE 47X51 STRL (DRAPES) ×1 IMPLANT
DRSG OPSITE POSTOP 4X10 (GAUZE/BANDAGES/DRESSINGS) IMPLANT
DRSG OPSITE POSTOP 4X8 (GAUZE/BANDAGES/DRESSINGS) IMPLANT
ELECT REM PT RETURN 9FT ADLT (ELECTROSURGICAL) ×1
ELECTRODE REM PT RTRN 9FT ADLT (ELECTROSURGICAL) ×1 IMPLANT
GAUZE SPONGE 4X4 12PLY STRL (GAUZE/BANDAGES/DRESSINGS) IMPLANT
GAUZE XEROFORM 1X8 LF (GAUZE/BANDAGES/DRESSINGS) ×1 IMPLANT
GLOVE BIOGEL PI IND STRL 8 (GLOVE) ×2 IMPLANT
GLOVE SKINSENSE STRL SZ8.0 LF (GLOVE) ×1 IMPLANT
GLOVE SURG ORTHO 8.0 STRL STRW (GLOVE) ×2 IMPLANT
GOWN STRL REUS W/ TWL LRG LVL3 (GOWN DISPOSABLE) ×1 IMPLANT
GOWN STRL REUS W/ TWL XL LVL3 (GOWN DISPOSABLE) ×1 IMPLANT
GOWN STRL REUS W/TWL LRG LVL3 (GOWN DISPOSABLE) ×1
GOWN STRL REUS W/TWL XL LVL3 (GOWN DISPOSABLE) ×1
HEAD MODULAR ENDO (Orthopedic Implant) ×1 IMPLANT
HEAD UNPLR 54XMDLR STRL HIP (Orthopedic Implant) IMPLANT
HEMOVAC 400ML (MISCELLANEOUS) IMPLANT
HOOD PEEL AWAY T7 (MISCELLANEOUS) ×2 IMPLANT
IMMBOLIZER KNEE 19 BLUE UNIV (SOFTGOODS) ×1 IMPLANT
IV NS IRRIG 3000ML ARTHROMATIC (IV SOLUTION) ×1 IMPLANT
KIT DRAIN HEMOVAC JP 7FR 400ML (MISCELLANEOUS) IMPLANT
KIT PREP HIP W/CEMENT RESTRICT (Miscellaneous) ×1 IMPLANT
KIT TURNOVER KIT A (KITS) ×1 IMPLANT
MANIFOLD NEPTUNE II (INSTRUMENTS) ×2 IMPLANT
NDL FILTER BLUNT 18X1 1/2 (NEEDLE) ×1 IMPLANT
NDL HYPO 22X1.5 SAFETY MO (MISCELLANEOUS) ×1 IMPLANT
NDL MAYO CATGUT SZ1 (NEEDLE) ×1 IMPLANT
NEEDLE FILTER BLUNT 18X1 1/2 (NEEDLE) ×1 IMPLANT
NEEDLE HYPO 22X1.5 SAFETY MO (MISCELLANEOUS) ×1 IMPLANT
NEEDLE MAYO CATGUT SZ1 (NEEDLE) ×1 IMPLANT
NS IRRIG 1000ML POUR BTL (IV SOLUTION) ×1 IMPLANT
PACK HIP PROSTHESIS (MISCELLANEOUS) ×1 IMPLANT
PENCIL SMOKE EVACUATOR (MISCELLANEOUS) ×1 IMPLANT
PULSAVAC PLUS IRRIG FAN TIP (DISPOSABLE) ×1
SLEEVE UNITRAX V40 (Orthopedic Implant) ×1 IMPLANT
SLEEVE UNITRAX V40 +8 (Orthopedic Implant) IMPLANT
SPACER OSTEO CEMENT (Orthopedic Implant) ×1 IMPLANT
SPACER OSTEO CEMENT 12HIP (Orthopedic Implant) IMPLANT
SPONGE T-LAP 18X18 ~~LOC~~+RFID (SPONGE) ×2 IMPLANT
STAPLER SKIN PROX 35W (STAPLE) ×1 IMPLANT
STEM FEM CEMT 49X158 SZ6 127D (Stem) IMPLANT
SUT ETHIBOND #5 BRAIDED 30INL (SUTURE) ×1 IMPLANT
SUT MNCRL 4-0 (SUTURE) ×1
SUT MNCRL 4-0 27XMFL (SUTURE) ×1
SUT VIC AB 0 CT1 36 (SUTURE) ×1 IMPLANT
SUT VIC AB 2-0 CT2 27 (SUTURE) ×2 IMPLANT
SUTURE MNCRL 4-0 27XMF (SUTURE) ×1 IMPLANT
SYR 20ML LL LF (SYRINGE) ×2 IMPLANT
SYR 50ML LL SCALE MARK (SYRINGE) ×1 IMPLANT
TAPE MICROFOAM 4IN (TAPE) ×1 IMPLANT
TIP FAN IRRIG PULSAVAC PLUS (DISPOSABLE) ×1 IMPLANT
TRAP FLUID SMOKE EVACUATOR (MISCELLANEOUS) ×1 IMPLANT
TUBE KAMVAC SUCTION (TUBING) ×1 IMPLANT
TUBE SUCT KAM VAC (TUBING) ×1 IMPLANT
WATER STERILE IRR 500ML POUR (IV SOLUTION) ×1 IMPLANT

## 2023-06-02 NOTE — Progress Notes (Signed)
[facility] CLINIC CARDIOLOGY CONSULT NOTE       Patient ID: Terry Collins MRN: [id number] DOB/AGE: [birth year] 87 y.o.  Admit date: 06/01/2023 Referring Physician Dr. Mikey College Primary Physician Dr. Marcello Fennel Primary Cardiologist Dr. Juliann Pares Reason for Consultation POC, atrial fibrillation  HPI: Terry Collins is a 87 y.o. male  with a past medical history of moderate AR, longstanding persistent atrial fibrillation s/p PVI 09/2017 (Pradaxa), carotid artery disease, hyperlipidemia, parkinson's who presented to the ED on 06/01/2023 for fall and hip pain. Found to be in atrial fibrillation with elevated rate. Cardiology was consulted for further evaluation.   Interval history: -Patient feeling better overall today. Denies chest pain, palpitations, shortness of breath.  -LE swelling mildly improved after restarting home lasix.  -HR much better controlled in the 70-80s. Optimized for surgery which is planned for this afternoon.   Review of systems complete and found to be negative unless listed above    Past Medical History:  Diagnosis Date   Anemia    Aortic valve insufficiency    Arthritis    hands   Atherosclerosis of aorta (HCC)    Carotid stenosis    History of kidney stones 2010   Mixed hyperlipidemia    Paroxysmal atrial fibrillation (HCC)    Venous insufficiency of both lower extremities     Past Surgical History:  Procedure Laterality Date   CARDIAC ELECTROPHYSIOLOGY STUDY AND ABLATION  2019   [facility]   CARDIOVERSION N/A 07/07/2021   Procedure: CARDIOVERSION;  Surgeon: Lamar Blinks, MD;  Location: [facility] ORS;  Service: Cardiovascular;  Laterality: N/A;   cardiovesion     EYE SURGERY Bilateral 2012   cataracts   HERNIA REPAIR Left 2008   inguinal   INGUINAL HERNIA REPAIR Right 06/24/2016   Procedure: HERNIA REPAIR INGUINAL ADULT;  Surgeon: Nadeen Landau, MD;  Location: [facility] ORS;  Service: General;  Laterality: Right;   TOTAL KNEE ARTHROPLASTY Right 07/16/2020    Procedure: RIGHT TOTAL KNEE ARTHROPLASTY;  Surgeon: Juanell Fairly, MD;  Location: [facility] ORS;  Service: Orthopedics;  Laterality: Right;   XI ROBOTIC ASSISTED INGUINAL HERNIA REPAIR WITH MESH Bilateral 01/20/2020   Procedure: XI ROBOTIC ASSISTED INGUINAL HERNIA REPAIR WITH MESH;  Surgeon: Carolan Shiver, MD;  Location: [facility] ORS;  Service: General;  Laterality: Bilateral;   XI ROBOTIC ASSISTED INGUINAL HERNIA REPAIR WITH MESH Right 12/16/2020   Procedure: XI ROBOTIC ASSISTED INGUINAL HERNIA REPAIR WITH MESH;  Surgeon: Carolan Shiver, MD;  Location: [facility] ORS;  Service: General;  Laterality: Right;    Medications Prior to Admission  Medication Sig Dispense Refill Last Dose   Calcium Polycarbophil (FIBER-CAPS PO) Take 3 capsules by mouth in the morning.   06/01/2023   carbidopa-levodopa (SINEMET IR) 25-100 MG tablet Take 1-2 tablets by mouth 5 (five) times daily. Takes two tablets every morning and one tablet rest of day   06/01/2023 at 0700   cycloSPORINE (RESTASIS) 0.05 % ophthalmic emulsion Place 1 drop into both eyes 2 (two) times daily.   05/31/2023   dabigatran (PRADAXA) 150 MG CAPS capsule Take 150 mg by mouth 2 (two) times daily.   06/01/2023 at 0700   ferrous sulfate 325 (65 FE) MG tablet Take 325 mg by mouth at bedtime.   05/31/2023   folic acid (FOLVITE) 800 MCG tablet Take 800 mcg by mouth in the morning.   06/01/2023   furosemide (LASIX) 40 MG tablet Take 40 mg by mouth daily.   06/01/2023   gabapentin (NEURONTIN) 100 MG capsule Take 100  0740 06/02/23 0747  BP: 127/81 138/83  (!) 153/90  Pulse: 80 (!) 57 91 80  Resp: 14 20  16   Temp: 98.3 F (36.8 C) (!) 97.4 F (36.3 C)  98.6 F (37 C)  TempSrc:      SpO2: 96% 100%  100%  Weight:      Height:        PHYSICAL EXAM General: Chronically ill-appearing, well nourished, in no acute distress laying at a slight incline in hospital bed. HEENT: Normocephalic and atraumatic. Neck: No JVD.  Lungs: Normal respiratory effort on 2L. Clear bilaterally to auscultation. No wheezes, crackles, rhonchi.  Heart: Irregularly irregular, elevated rate. Normal S1 and S2 without gallops or murmurs.  Abdomen: Non-distended appearing.  Msk: Normal strength and tone for age. Extremities: Warm and well perfused. No clubbing, cyanosis.  Trace edema bilaterally.  Neuro: Alert and oriented X 3. Psych: Answers questions appropriately.   Labs: Basic Metabolic Panel: Recent Labs    06/01/23 0847 06/02/23 0330  NA 140 139  K 3.7 3.7  CL 100 101  CO2 32 27  GLUCOSE 116* 100*  BUN 25* 26*  CREATININE 0.93 0.89  CALCIUM 9.2 8.5*   Liver Function Tests: No results for input(s): "AST", "ALT", "ALKPHOS", "BILITOT", "PROT", "ALBUMIN" in the last 72 hours. No results for input(s): "LIPASE", "AMYLASE" in the last 72  hours. CBC: Recent Labs    06/01/23 0847 06/02/23 0330  WBC 7.9 7.6  NEUTROABS 6.9  --   HGB 14.0 12.0*  HCT 43.1 35.3*  MCV 103.1* 99.4  PLT 151 135*   Cardiac Enzymes: Recent Labs    06/01/23 0847 06/01/23 1242  TROPONINIHS 13 16   BNP: No results for input(s): "BNP" in the last 72 hours. D-Dimer: No results for input(s): "DDIMER" in the last 72 hours. Hemoglobin A1C: No results for input(s): "HGBA1C" in the last 72 hours. Fasting Lipid Panel: No results for input(s): "CHOL", "HDL", "LDLCALC", "TRIG", "CHOLHDL", "LDLDIRECT" in the last 72 hours. Thyroid Function Tests: No results for input(s): "TSH", "T4TOTAL", "T3FREE", "THYROIDAB" in the last 72 hours.  Invalid input(s): "FREET3" Anemia Panel: No results for input(s): "VITAMINB12", "FOLATE", "FERRITIN", "TIBC", "IRON", "RETICCTPCT" in the last 72 hours.   Radiology: ECHOCARDIOGRAM COMPLETE  Result Date: 06/01/2023    ECHOCARDIOGRAM REPORT   Patient Name:   Terry Collins Date of Exam: 06/01/2023 Medical Rec #:  [id number]     Height:       74.0 in Accession #:    [id number]    Weight:       165.0 lb Date of Birth:  1936/01/15     BSA:          2.003 m Patient Age:    87 years      BP:           149/91 mmHg Patient Gender: M             HR:           84 bpm. Exam Location:  [facility] Procedure: 2D Echo, Cardiac Doppler and Color Doppler Indications:     Aortic regurgitation I35.1  History:         Patient has no prior history of Echocardiogram examinations.                  Paroxysmal Afib, Aortic valve insufficiency.  Sonographer:     Cristela Blue Referring Phys:  [id number] Gissella Niblack Diagnosing Phys: Mellody Drown Alluri IMPRESSIONS  1. Left ventricular ejection fraction,  [facility] CLINIC CARDIOLOGY CONSULT NOTE       Patient ID: Terry Collins MRN: [id number] DOB/AGE: [birth year] 87 y.o.  Admit date: 06/01/2023 Referring Physician Dr. Mikey College Primary Physician Dr. Marcello Fennel Primary Cardiologist Dr. Juliann Pares Reason for Consultation POC, atrial fibrillation  HPI: Terry Collins is a 87 y.o. male  with a past medical history of moderate AR, longstanding persistent atrial fibrillation s/p PVI 09/2017 ([place]rad[place]a), carotid artery disease, hyperlipidemia, parkinson's who presented to the ED on 06/01/2023 for fall and hip pain. Found to be in atrial fibrillation with elevated rate. Cardiology was consulted for further evaluation.   Interval history: -Patient feeling better overall today. Denies chest pain, palpitations, shortness of breath.  -LE swelling mildly improved after restarting home lasix.  -HR much better controlled in the 70-80s. Optimized for surgery which is planned for this afternoon.   Review of systems complete and found to be negative unless listed above    Past Medical History:  Diagnosis Date   Anemia    Aortic valve insufficiency    Arthritis    hands   Atherosclerosis of aorta (HCC)    Carotid stenosis    History of kidney stones 2010   Mixed hyperlipidemia    Paroxysmal atrial fibrillation (HCC)    Venous insufficiency of both lower extremities     Past Surgical History:  Procedure Laterality Date   CARDIAC ELECTROPHYSIOLOGY STUDY AND ABLATION  2019   [facility]   CARDIOVERSION N/A 07/07/2021   Procedure: CARDIOVERSION;  Surgeon: Lamar Blinks, MD;  Location: [facility] ORS;  Service: Cardiovascular;  Laterality: N/A;   cardiovesion     EYE SURGERY Bilateral 2012   cataracts   HERNIA REPAIR Left 2008   inguinal   INGUINAL HERNIA REPAIR Right 06/24/2016   Procedure: HERNIA REPAIR INGUINAL ADULT;  Surgeon: Nadeen Landau, MD;  Location: [facility] ORS;  Service: General;  Laterality: Right;   TOTAL KNEE ARTHROPLASTY Right 07/16/2020    Procedure: RIGHT TOTAL KNEE ARTHROPLASTY;  Surgeon: Juanell Fairly, MD;  Location: [facility] ORS;  Service: Orthopedics;  Laterality: Right;   XI ROBOTIC ASSISTED INGUINAL HERNIA REPAIR WITH MESH Bilateral 01/20/2020   Procedure: XI ROBOTIC ASSISTED INGUINAL HERNIA REPAIR WITH MESH;  Surgeon: Carolan Shiver, MD;  Location: [facility] ORS;  Service: General;  Laterality: Bilateral;   XI ROBOTIC ASSISTED INGUINAL HERNIA REPAIR WITH MESH Right 12/16/2020   Procedure: XI ROBOTIC ASSISTED INGUINAL HERNIA REPAIR WITH MESH;  Surgeon: Carolan Shiver, MD;  Location: [facility] ORS;  Service: General;  Laterality: Right;    Medications Prior to Admission  Medication Sig Dispense Refill Last Dose   Calcium Polycarbophil (FIBER-CAPS PO) Take 3 capsules by mouth in the morning.   06/01/2023   carbidopa-levodopa (SINEMET IR) 25-100 MG tablet Take 1-2 tablets by mouth 5 (five) times daily. Takes two tablets every morning and one tablet rest of day   06/01/2023 at 0700   cycloSPORINE (RESTASIS) 0.05 % ophthalmic emulsion Place 1 drop into both eyes 2 (two) times daily.   05/31/2023   dabigatran (PRADAXA) 150 MG CAPS capsule Take 150 mg by mouth 2 (two) times daily.   06/01/2023 at 0700   ferrous sulfate 325 (65 FE) MG tablet Take 325 mg by mouth at bedtime.   05/31/2023   folic acid (FOLVITE) 800 MCG tablet Take 800 mcg by mouth in the morning.   06/01/2023   furosemide (LASIX) 40 MG tablet Take 40 mg by mouth daily.   06/01/2023   gabapentin (NEURONTIN) 100 MG capsule Take 100  0740 06/02/23 0747  BP: 127/81 138/83  (!) 153/90  Pulse: 80 (!) 57 91 80  Resp: 14 20  16   Temp: 98.3 F (36.8 C) (!) 97.4 F (36.3 C)  98.6 F (37 C)  TempSrc:      SpO2: 96% 100%  100%  Weight:      Height:        PHYSICAL EXAM General: Chronically ill-appearing, well nourished, in no acute distress laying at a slight incline in hospital bed. HEENT: Normocephalic and atraumatic. Neck: No JVD.  Lungs: Normal respiratory effort on 2L. Clear bilaterally to auscultation. No wheezes, crackles, rhonchi.  Heart: Irregularly irregular, elevated rate. Normal S1 and S2 without gallops or murmurs.  Abdomen: Non-distended appearing.  Msk: Normal strength and tone for age. Extremities: Warm and well perfused. No clubbing, cyanosis.  Trace edema bilaterally.  Neuro: Alert and oriented X 3. Psych: Answers questions appropriately.   Labs: Basic Metabolic Panel: Recent Labs    06/01/23 0847 06/02/23 0330  NA 140 139  K 3.7 3.7  CL 100 101  CO2 32 27  GLUCOSE 116* 100*  BUN 25* 26*  CREATININE 0.93 0.89  CALCIUM 9.2 8.5*   Liver Function Tests: No results for input(s): "AST", "ALT", "ALKPHOS", "BILITOT", "PROT", "ALBUMIN" in the last 72 hours. No results for input(s): "LIPASE", "AMYLASE" in the last 72  hours. CBC: Recent Labs    06/01/23 0847 06/02/23 0330  WBC 7.9 7.6  NEUTROABS 6.9  --   HGB 14.0 12.0*  HCT 43.1 35.3*  MCV 103.1* 99.4  PLT 151 135*   Cardiac Enzymes: Recent Labs    06/01/23 0847 06/01/23 1242  TROPONINIHS 13 16   BNP: No results for input(s): "BNP" in the last 72 hours. D-Dimer: No results for input(s): "DDIMER" in the last 72 hours. Hemoglobin A1C: No results for input(s): "HGBA1C" in the last 72 hours. Fasting Lipid Panel: No results for input(s): "CHOL", "HDL", "LDLCALC", "TRIG", "CHOLHDL", "LDLDIRECT" in the last 72 hours. Thyroid Function Tests: No results for input(s): "TSH", "T4TOTAL", "T3FREE", "THYROIDAB" in the last 72 hours.  Invalid input(s): "FREET3" Anemia Panel: No results for input(s): "VITAMINB12", "FOLATE", "FERRITIN", "TIBC", "IRON", "RETICCTPCT" in the last 72 hours.   Radiology: ECHOCARDIOGRAM COMPLETE  Result Date: 06/01/2023    ECHOCARDIOGRAM REPORT   Patient Name:   Terry Collins Date of Exam: 06/01/2023 Medical Rec #:  [id number]     Height:       74.0 in Accession #:    [id number]    Weight:       165.0 lb Date of Birth:  1936/01/15     BSA:          2.003 m Patient Age:    87 years      BP:           149/91 mmHg Patient Gender: M             HR:           84 bpm. Exam Location:  [facility] Procedure: 2D Echo, Cardiac Doppler and Color Doppler Indications:     Aortic regurgitation I35.1  History:         Patient has no prior history of Echocardiogram examinations.                  Paroxysmal Afib, Aortic valve insufficiency.  Sonographer:     Cristela Blue Referring Phys:  [id number] Gissella Niblack Diagnosing Phys: Mellody Drown Alluri IMPRESSIONS  1. Left ventricular ejection fraction,  0740 06/02/23 0747  BP: 127/81 138/83  (!) 153/90  Pulse: 80 (!) 57 91 80  Resp: 14 20  16   Temp: 98.3 F (36.8 C) (!) 97.4 F (36.3 C)  98.6 F (37 C)  TempSrc:      SpO2: 96% 100%  100%  Weight:      Height:        PHYSICAL EXAM General: Chronically ill-appearing, well nourished, in no acute distress laying at a slight incline in hospital bed. HEENT: Normocephalic and atraumatic. Neck: No JVD.  Lungs: Normal respiratory effort on 2L. Clear bilaterally to auscultation. No wheezes, crackles, rhonchi.  Heart: Irregularly irregular, elevated rate. Normal S1 and S2 without gallops or murmurs.  Abdomen: Non-distended appearing.  Msk: Normal strength and tone for age. Extremities: Warm and well perfused. No clubbing, cyanosis.  Trace edema bilaterally.  Neuro: Alert and oriented X 3. Psych: Answers questions appropriately.   Labs: Basic Metabolic Panel: Recent Labs    06/01/23 0847 06/02/23 0330  NA 140 139  K 3.7 3.7  CL 100 101  CO2 32 27  GLUCOSE 116* 100*  BUN 25* 26*  CREATININE 0.93 0.89  CALCIUM 9.2 8.5*   Liver Function Tests: No results for input(s): "AST", "ALT", "ALKPHOS", "BILITOT", "PROT", "ALBUMIN" in the last 72 hours. No results for input(s): "LIPASE", "AMYLASE" in the last 72  hours. CBC: Recent Labs    06/01/23 0847 06/02/23 0330  WBC 7.9 7.6  NEUTROABS 6.9  --   HGB 14.0 12.0*  HCT 43.1 35.3*  MCV 103.1* 99.4  PLT 151 135*   Cardiac Enzymes: Recent Labs    06/01/23 0847 06/01/23 1242  TROPONINIHS 13 16   BNP: No results for input(s): "BNP" in the last 72 hours. D-Dimer: No results for input(s): "DDIMER" in the last 72 hours. Hemoglobin A1C: No results for input(s): "HGBA1C" in the last 72 hours. Fasting Lipid Panel: No results for input(s): "CHOL", "HDL", "LDLCALC", "TRIG", "CHOLHDL", "LDLDIRECT" in the last 72 hours. Thyroid Function Tests: No results for input(s): "TSH", "T4TOTAL", "T3FREE", "THYROIDAB" in the last 72 hours.  Invalid input(s): "FREET3" Anemia Panel: No results for input(s): "VITAMINB12", "FOLATE", "FERRITIN", "TIBC", "IRON", "RETICCTPCT" in the last 72 hours.   Radiology: ECHOCARDIOGRAM COMPLETE  Result Date: 06/01/2023    ECHOCARDIOGRAM REPORT   Patient Name:   Terry Collins Date of Exam: 06/01/2023 Medical Rec #:  [id number]     Height:       74.0 in Accession #:    [id number]    Weight:       165.0 lb Date of Birth:  1936/01/15     BSA:          2.003 m Patient Age:    87 years      BP:           149/91 mmHg Patient Gender: M             HR:           84 bpm. Exam Location:  [facility] Procedure: 2D Echo, Cardiac Doppler and Color Doppler Indications:     Aortic regurgitation I35.1  History:         Patient has no prior history of Echocardiogram examinations.                  Paroxysmal Afib, Aortic valve insufficiency.  Sonographer:     Cristela Blue Referring Phys:  [id number] Gissella Niblack Diagnosing Phys: Mellody Drown Alluri IMPRESSIONS  1. Left ventricular ejection fraction,  [facility] CLINIC CARDIOLOGY CONSULT NOTE       Patient ID: Terry Collins MRN: [id number] DOB/AGE: [birth year] 87 y.o.  Admit date: 06/01/2023 Referring Physician Dr. Mikey College Primary Physician Dr. Marcello Fennel Primary Cardiologist Dr. Juliann Pares Reason for Consultation POC, atrial fibrillation  HPI: Terry Collins is a 87 y.o. male  with a past medical history of moderate AR, longstanding persistent atrial fibrillation s/p PVI 09/2017 (Pradaxa), carotid artery disease, hyperlipidemia, parkinson's who presented to the ED on 06/01/2023 for fall and hip pain. Found to be in atrial fibrillation with elevated rate. Cardiology was consulted for further evaluation.   Interval history: -Patient feeling better overall today. Denies chest pain, palpitations, shortness of breath.  -LE swelling mildly improved after restarting home lasix.  -HR much better controlled in the 70-80s. Optimized for surgery which is planned for this afternoon.   Review of systems complete and found to be negative unless listed above    Past Medical History:  Diagnosis Date   Anemia    Aortic valve insufficiency    Arthritis    hands   Atherosclerosis of aorta (HCC)    Carotid stenosis    History of kidney stones 2010   Mixed hyperlipidemia    Paroxysmal atrial fibrillation (HCC)    Venous insufficiency of both lower extremities     Past Surgical History:  Procedure Laterality Date   CARDIAC ELECTROPHYSIOLOGY STUDY AND ABLATION  2019   [facility]   CARDIOVERSION N/A 07/07/2021   Procedure: CARDIOVERSION;  Surgeon: Lamar Blinks, MD;  Location: [facility] ORS;  Service: Cardiovascular;  Laterality: N/A;   cardiovesion     EYE SURGERY Bilateral 2012   cataracts   HERNIA REPAIR Left 2008   inguinal   INGUINAL HERNIA REPAIR Right 06/24/2016   Procedure: HERNIA REPAIR INGUINAL ADULT;  Surgeon: Nadeen Landau, MD;  Location: [facility] ORS;  Service: General;  Laterality: Right;   TOTAL KNEE ARTHROPLASTY Right 07/16/2020    Procedure: RIGHT TOTAL KNEE ARTHROPLASTY;  Surgeon: Juanell Fairly, MD;  Location: [facility] ORS;  Service: Orthopedics;  Laterality: Right;   XI ROBOTIC ASSISTED INGUINAL HERNIA REPAIR WITH MESH Bilateral 01/20/2020   Procedure: XI ROBOTIC ASSISTED INGUINAL HERNIA REPAIR WITH MESH;  Surgeon: Carolan Shiver, MD;  Location: [facility] ORS;  Service: General;  Laterality: Bilateral;   XI ROBOTIC ASSISTED INGUINAL HERNIA REPAIR WITH MESH Right 12/16/2020   Procedure: XI ROBOTIC ASSISTED INGUINAL HERNIA REPAIR WITH MESH;  Surgeon: Carolan Shiver, MD;  Location: [facility] ORS;  Service: General;  Laterality: Right;    Medications Prior to Admission  Medication Sig Dispense Refill Last Dose   Calcium Polycarbophil (FIBER-CAPS PO) Take 3 capsules by mouth in the morning.   06/01/2023   carbidopa-levodopa (SINEMET IR) 25-100 MG tablet Take 1-2 tablets by mouth 5 (five) times daily. Takes two tablets every morning and one tablet rest of day   06/01/2023 at 0700   cycloSPORINE (RESTASIS) 0.05 % ophthalmic emulsion Place 1 drop into both eyes 2 (two) times daily.   05/31/2023   dabigatran (PRADAXA) 150 MG CAPS capsule Take 150 mg by mouth 2 (two) times daily.   06/01/2023 at 0700   ferrous sulfate 325 (65 FE) MG tablet Take 325 mg by mouth at bedtime.   05/31/2023   folic acid (FOLVITE) 800 MCG tablet Take 800 mcg by mouth in the morning.   06/01/2023   furosemide (LASIX) 40 MG tablet Take 40 mg by mouth daily.   06/01/2023   gabapentin (NEURONTIN) 100 MG capsule Take 100

## 2023-06-02 NOTE — Plan of Care (Signed)
  Problem: Clinical Measurements: Goal: Ability to maintain clinical measurements within normal limits will improve Outcome: Progressing   Problem: Clinical Measurements: Goal: Will remain free from infection Outcome: Progressing   Problem: Clinical Measurements: Goal: Cardiovascular complication will be avoided Outcome: Progressing   

## 2023-06-02 NOTE — Anesthesia Procedure Notes (Signed)
Procedure Name: Intubation Date/Time: 06/02/2023 1:03 PM  Performed by: Omer Jack, CRNAPre-anesthesia Checklist: Patient identified, Patient being monitored, Timeout performed, Emergency Drugs available and Suction available Patient Re-evaluated:Patient Re-evaluated prior to induction Oxygen Delivery Method: Circle system utilized Preoxygenation: Pre-oxygenation with 100% oxygen Induction Type: IV induction Ventilation: Mask ventilation without difficulty Laryngoscope Size: McGraph and 4 Grade View: Grade I Tube type: Oral Tube size: 7.0 mm Number of attempts: 2 Airway Equipment and Method: Stylet Placement Confirmation: ETT inserted through vocal cords under direct vision, positive ETCO2 and breath sounds checked- equal and bilateral Secured at: 21 cm Tube secured with: Tape Dental Injury: Teeth and Oropharynx as per pre-operative assessment

## 2023-06-02 NOTE — Progress Notes (Signed)
Initial Nutrition Assessment  DOCUMENTATION CODES:   Not applicable  INTERVENTION:   Ensure Enlive po TID, each supplement provides 350 kcal and 20 grams of protein.  Magic cup TID with meals, each supplement provides 290 kcal and 9 grams of protein  MVI po daily   Pt at high refeed risk; recommend monitor potassium, magnesium and phosphorus labs daily until stable  Daily weights   NUTRITION DIAGNOSIS:   Increased nutrient needs related to hip fracture as evidenced by estimated needs.  GOAL:   Patient will meet greater than or equal to 90% of their needs  MONITOR:   Supplement acceptance, PO intake, Labs, I & O's, Skin, Weight trends  REASON FOR ASSESSMENT:   Consult Hip fracture protocol  ASSESSMENT:   87 y/o male with h/o Afib, Parkinson's, kidney stones, carotid stenosis and HLD who is admitted with hip fracture after fall now s/p left hip hemiarthroplasty 10/4.  RD unable to see pt as pt in the OR at time of RD visit. RD will add supplements and MVI to help pt meet his estimated needs. Pt is likely at refeed risk. Pt NPO today for surgery. Per chart, pt appears fairly weight stable at baseline. Pt is at high risk for malnutrition. RD will follow up to obtain nutrition related history and exam.   Medications reviewed and include: lasix, heparin   Labs reviewed: K 3.7 wnl, BUN 26(H)  NUTRITION - FOCUSED PHYSICAL EXAM: Unable to perform at this time   Diet Order:   Diet Order             Diet NPO time specified Except for: Sips with Meds  Diet effective midnight                  EDUCATION NEEDS:   Not appropriate for education at this time  Skin:  Skin Assessment: Reviewed RN Assessment (incision L hip)  Last BM:  10/2  Height:   Ht Readings from Last 1 Encounters:  06/02/23 6\' 2"  (1.88 m)    Weight:   Wt Readings from Last 1 Encounters:  06/02/23 74 kg    Ideal Body Weight:  86.3 kg  BMI:  Body mass index is 20.95 kg/m.  Estimated  Nutritional Needs:   Kcal:  2000-2300kcal/day  Protein:  100-115g/day  Fluid:  1.9-2.2L/day  Betsey Holiday MS, RD, LDN Please refer to [facility] for RD and/or RD on-call/weekend/after hours pager

## 2023-06-02 NOTE — Plan of Care (Signed)
  Problem: Education: Goal: Knowledge of General Education information will improve Description Including pain rating scale, medication(s)/side effects and non-pharmacologic comfort measures Outcome: Progressing   

## 2023-06-02 NOTE — Progress Notes (Signed)
PROGRESS NOTE    Terry Collins  WUJ:[id number] DOB: 04-28-1936 DOA: 06/01/2023 PCP: Terry Reichmann, MD   Assessment & Plan:   Principal Problem:   Hip fracture (HCC) Active Problems:   Closed left hip fracture (HCC)   Atrial fibrillation with RVR (HCC)   Parkinson disease (HCC)  Assessment and Plan: Left femoral neck fracture: will go for ORIF today as per ortho surg. Holding pradaxa. Norco, dilaudid prn for pain    A-fib: with RVR. Persistent  a.fib. Continue on metoprolol and IV heparin drip. Echo ordered   Aortic stenosis: continue w/ supportive care  Chronic diastolic CHF: appears euvolemic. Holding lasix. Monitor I/Os    Parkinson's disease: continue on home dose of sinemet  Thrombocytopenia: etiology unclear. Will continue to monitor      DVT prophylaxis: IV heparin  Code Status: full  Family Communication: discussed pt's care w/ pt's family at bedside and answered their questions  Disposition Plan: depends on PT/OT recs (not consulted yet)  Level of care: Telemetry Medical Status is: Inpatient Remains inpatient appropriate because: severity of illness, going for surg today     Consultants:  Ortho surg  Cardio   Procedures:   Antimicrobials:    Subjective: Pt c/o malaise   Objective: Vitals:   06/02/23 0109 06/02/23 0409 06/02/23 0740 06/02/23 0747  BP: 127/81 138/83  (!) 153/90  Pulse: 80 (!) 57 91 80  Resp: 14 20  16   Temp: 98.3 F (36.8 C) (!) 97.4 F (36.3 C)  98.6 F (37 C)  TempSrc:      SpO2: 96% 100%  100%  Weight:      Height:        Intake/Output Summary (Last 24 hours) at 06/02/2023 0851 Last data filed at 06/02/2023 0300 Gross per 24 hour  Intake 74.01 ml  Output --  Net 74.01 ml   Filed Weights   06/01/23 0839  Weight: 74.8 kg    Examination:  General exam: Appears calm and comfortable  Respiratory system: Clear to auscultation. Respiratory effort normal. Cardiovascular system: S1 & S2 +. No rubs, gallops or  clicks. Gastrointestinal system: Abdomen is nondistended, soft and nontender.  Normal bowel sounds heard. Central nervous system: Alert and oriented. Moves all extremities  Psychiatry: Judgement and insight appear normal. Flat mood and affect    Data Reviewed: I have personally reviewed following labs and imaging studies  CBC: Recent Labs  Lab 06/01/23 0847 06/02/23 0330  WBC 7.9 7.6  NEUTROABS 6.9  --   HGB 14.0 12.0*  HCT 43.1 35.3*  MCV 103.1* 99.4  PLT 151 135*   Basic Metabolic Panel: Recent Labs  Lab 06/01/23 0847 06/02/23 0330  NA 140 139  K 3.7 3.7  CL 100 101  CO2 32 27  GLUCOSE 116* 100*  BUN 25* 26*  CREATININE 0.93 0.89  CALCIUM 9.2 8.5*   GFR: Estimated Creatinine Clearance: 64.2 mL/min (by C-G formula based on SCr of 0.89 mg/dL). Liver Function Tests: No results for input(s): "AST", "ALT", "ALKPHOS", "BILITOT", "PROT", "ALBUMIN" in the last 168 hours. No results for input(s): "LIPASE", "AMYLASE" in the last 168 hours. No results for input(s): "AMMONIA" in the last 168 hours. Coagulation Profile: Recent Labs  Lab 06/01/23 0847  INR 1.4*   Cardiac Enzymes: No results for input(s): "CKTOTAL", "CKMB", "CKMBINDEX", "TROPONINI" in the last 168 hours. BNP (last 3 results) No results for input(s): "PROBNP" in the last 8760 hours. HbA1C: No results for input(s): "HGBA1C" in the last 72 hours. CBG:  No results for input(s): "GLUCAP" in the last 168 hours. Lipid Profile: No results for input(s): "CHOL", "HDL", "LDLCALC", "TRIG", "CHOLHDL", "LDLDIRECT" in the last 72 hours. Thyroid Function Tests: No results for input(s): "TSH", "T4TOTAL", "FREET4", "T3FREE", "THYROIDAB" in the last 72 hours. Anemia Panel: No results for input(s): "VITAMINB12", "FOLATE", "FERRITIN", "TIBC", "IRON", "RETICCTPCT" in the last 72 hours. Sepsis Labs: No results for input(s): "PROCALCITON", "LATICACIDVEN" in the last 168 hours.  Recent Results (from the past 240 hour(s))   Surgical PCR screen     Status: None   Collection Time: 06/02/23  2:16 AM   Specimen: Nasal Mucosa; Nasal Swab  Result Value Ref Range Status   MRSA, PCR NEGATIVE NEGATIVE Final   Staphylococcus aureus NEGATIVE NEGATIVE Final    Comment: (NOTE) The Xpert SA Assay (FDA approved for NASAL specimens in patients 19 years of age and older), is one component of a comprehensive surveillance program. It is not intended to diagnose infection nor to guide or monitor treatment. Performed at [facility], [address], [place], [place] [address]          Radiology Studies: ECHOCARDIOGRAM COMPLETE  Result Date: 06/01/2023    ECHOCARDIOGRAM REPORT   Patient Name:   Terry Collins Date of Exam: 06/01/2023 Medical Rec #:  [id number]     Height:       74.0 in Accession #:    [id number]    Weight:       165.0 lb Date of Birth:  07-Sep-1935     BSA:          2.003 m Patient Age:    87 years      BP:           149/91 mmHg Patient Gender: M             HR:           84 bpm. Exam Location:  [facility] Procedure: 2D Echo, Cardiac Doppler and Color Doppler Indications:     Aortic regurgitation I35.1  History:         Patient has no prior history of Echocardiogram examinations.                  Paroxysmal Afib, Aortic valve insufficiency.  Sonographer:     Cristela Blue Referring Phys:  [id number] CARALYN HUDSON Diagnosing Phys: Mellody Drown Alluri IMPRESSIONS  1. Left ventricular ejection fraction, by estimation, is 50 to 55% with beat to beat variability. The left ventricle has normal function. The left ventricle has no regional wall motion abnormalities. There is moderate concentric left ventricular hypertrophy. Left ventricular diastolic parameters are indeterminate.  2. Right ventricular systolic function was not well visualized.  3. Right atrial size was mildly dilated.  4. The mitral valve is normal in structure. Trivial mitral valve regurgitation.  5. The aortic valve is tricuspid. There is mild thickening of the  aortic valve. Aortic valve regurgitation is moderate to severe.  6. The inferior vena cava is dilated in size with <50% respiratory variability, suggesting right atrial pressure of 15 mmHg. FINDINGS  Left Ventricle: Left ventricular ejection fraction, by estimation, is 50 to 55%. The left ventricle has normal function. The left ventricle has no regional wall motion abnormalities. The left ventricular internal cavity size was normal in size. There is  moderate concentric left ventricular hypertrophy. Left ventricular diastolic parameters are indeterminate. Right Ventricle: Right vetricular wall thickness was not well visualized. Right ventricular systolic function was not well visualized. Left Atrium: Left  PROGRESS NOTE    Terry Collins  WUJ:[id number] DOB: 04-28-1936 DOA: 06/01/2023 PCP: Terry Reichmann, MD   Assessment & Plan:   Principal Problem:   Hip fracture (HCC) Active Problems:   Closed left hip fracture (HCC)   Atrial fibrillation with RVR (HCC)   Parkinson disease (HCC)  Assessment and Plan: Left femoral neck fracture: will go for ORIF today as per ortho surg. Holding pradaxa. Norco, dilaudid prn for pain    A-fib: with RVR. Persistent  a.fib. Continue on metoprolol and IV heparin drip. Echo ordered   Aortic stenosis: continue w/ supportive care  Chronic diastolic CHF: appears euvolemic. Holding lasix. Monitor I/Os    Parkinson's disease: continue on home dose of sinemet  Thrombocytopenia: etiology unclear. Will continue to monitor      DVT prophylaxis: IV heparin  Code Status: full  Family Communication: discussed pt's care w/ pt's family at bedside and answered their questions  Disposition Plan: depends on PT/OT recs (not consulted yet)  Level of care: Telemetry Medical Status is: Inpatient Remains inpatient appropriate because: severity of illness, going for surg today     Consultants:  Ortho surg  Cardio   Procedures:   Antimicrobials:    Subjective: Pt c/o malaise   Objective: Vitals:   06/02/23 0109 06/02/23 0409 06/02/23 0740 06/02/23 0747  BP: 127/81 138/83  (!) 153/90  Pulse: 80 (!) 57 91 80  Resp: 14 20  16   Temp: 98.3 F (36.8 C) (!) 97.4 F (36.3 C)  98.6 F (37 C)  TempSrc:      SpO2: 96% 100%  100%  Weight:      Height:        Intake/Output Summary (Last 24 hours) at 06/02/2023 0851 Last data filed at 06/02/2023 0300 Gross per 24 hour  Intake 74.01 ml  Output --  Net 74.01 ml   Filed Weights   06/01/23 0839  Weight: 74.8 kg    Examination:  General exam: Appears calm and comfortable  Respiratory system: Clear to auscultation. Respiratory effort normal. Cardiovascular system: S1 & S2 +. No rubs, gallops or  clicks. Gastrointestinal system: Abdomen is nondistended, soft and nontender.  Normal bowel sounds heard. Central nervous system: Alert and oriented. Moves all extremities  Psychiatry: Judgement and insight appear normal. Flat mood and affect    Data Reviewed: I have personally reviewed following labs and imaging studies  CBC: Recent Labs  Lab 06/01/23 0847 06/02/23 0330  WBC 7.9 7.6  NEUTROABS 6.9  --   HGB 14.0 12.0*  HCT 43.1 35.3*  MCV 103.1* 99.4  PLT 151 135*   Basic Metabolic Panel: Recent Labs  Lab 06/01/23 0847 06/02/23 0330  NA 140 139  K 3.7 3.7  CL 100 101  CO2 32 27  GLUCOSE 116* 100*  BUN 25* 26*  CREATININE 0.93 0.89  CALCIUM 9.2 8.5*   GFR: Estimated Creatinine Clearance: 64.2 mL/min (by C-G formula based on SCr of 0.89 mg/dL). Liver Function Tests: No results for input(s): "AST", "ALT", "ALKPHOS", "BILITOT", "PROT", "ALBUMIN" in the last 168 hours. No results for input(s): "LIPASE", "AMYLASE" in the last 168 hours. No results for input(s): "AMMONIA" in the last 168 hours. Coagulation Profile: Recent Labs  Lab 06/01/23 0847  INR 1.4*   Cardiac Enzymes: No results for input(s): "CKTOTAL", "CKMB", "CKMBINDEX", "TROPONINI" in the last 168 hours. BNP (last 3 results) No results for input(s): "PROBNP" in the last 8760 hours. HbA1C: No results for input(s): "HGBA1C" in the last 72 hours. CBG:  PROGRESS NOTE    Terry Collins  WUJ:[id number] DOB: 04-28-1936 DOA: 06/01/2023 PCP: Terry Reichmann, MD   Assessment & Plan:   Principal Problem:   Hip fracture (HCC) Active Problems:   Closed left hip fracture (HCC)   Atrial fibrillation with RVR (HCC)   Parkinson disease (HCC)  Assessment and Plan: Left femoral neck fracture: will go for ORIF today as per ortho surg. Holding pradaxa. Norco, dilaudid prn for pain    A-fib: with RVR. Persistent  a.fib. Continue on metoprolol and IV heparin drip. Echo ordered   Aortic stenosis: continue w/ supportive care  Chronic diastolic CHF: appears euvolemic. Holding lasix. Monitor I/Os    Parkinson's disease: continue on home dose of sinemet  Thrombocytopenia: etiology unclear. Will continue to monitor      DVT prophylaxis: IV heparin  Code Status: full  Family Communication: discussed pt's care w/ pt's family at bedside and answered their questions  Disposition Plan: depends on PT/OT recs (not consulted yet)  Level of care: Telemetry Medical Status is: Inpatient Remains inpatient appropriate because: severity of illness, going for surg today     Consultants:  Ortho surg  Cardio   Procedures:   Antimicrobials:    Subjective: Pt c/o malaise   Objective: Vitals:   06/02/23 0109 06/02/23 0409 06/02/23 0740 06/02/23 0747  BP: 127/81 138/83  (!) 153/90  Pulse: 80 (!) 57 91 80  Resp: 14 20  16   Temp: 98.3 F (36.8 C) (!) 97.4 F (36.3 C)  98.6 F (37 C)  TempSrc:      SpO2: 96% 100%  100%  Weight:      Height:        Intake/Output Summary (Last 24 hours) at 06/02/2023 0851 Last data filed at 06/02/2023 0300 Gross per 24 hour  Intake 74.01 ml  Output --  Net 74.01 ml   Filed Weights   06/01/23 0839  Weight: 74.8 kg    Examination:  General exam: Appears calm and comfortable  Respiratory system: Clear to auscultation. Respiratory effort normal. Cardiovascular system: S1 & S2 +. No rubs, gallops or  clicks. Gastrointestinal system: Abdomen is nondistended, soft and nontender.  Normal bowel sounds heard. Central nervous system: Alert and oriented. Moves all extremities  Psychiatry: Judgement and insight appear normal. Flat mood and affect    Data Reviewed: I have personally reviewed following labs and imaging studies  CBC: Recent Labs  Lab 06/01/23 0847 06/02/23 0330  WBC 7.9 7.6  NEUTROABS 6.9  --   HGB 14.0 12.0*  HCT 43.1 35.3*  MCV 103.1* 99.4  PLT 151 135*   Basic Metabolic Panel: Recent Labs  Lab 06/01/23 0847 06/02/23 0330  NA 140 139  K 3.7 3.7  CL 100 101  CO2 32 27  GLUCOSE 116* 100*  BUN 25* 26*  CREATININE 0.93 0.89  CALCIUM 9.2 8.5*   GFR: Estimated Creatinine Clearance: 64.2 mL/min (by C-G formula based on SCr of 0.89 mg/dL). Liver Function Tests: No results for input(s): "AST", "ALT", "ALKPHOS", "BILITOT", "PROT", "ALBUMIN" in the last 168 hours. No results for input(s): "LIPASE", "AMYLASE" in the last 168 hours. No results for input(s): "AMMONIA" in the last 168 hours. Coagulation Profile: Recent Labs  Lab 06/01/23 0847  INR 1.4*   Cardiac Enzymes: No results for input(s): "CKTOTAL", "CKMB", "CKMBINDEX", "TROPONINI" in the last 168 hours. BNP (last 3 results) No results for input(s): "PROBNP" in the last 8760 hours. HbA1C: No results for input(s): "HGBA1C" in the last 72 hours. CBG:

## 2023-06-02 NOTE — Op Note (Signed)
DATE OF SURGERY: 06/02/2023  PREOPERATIVE DIAGNOSIS: Left femoral neck fracture  POSTOPERATIVE DIAGNOSIS: Left femoral neck fracture  PROCEDURE: Left hip hemiarthroplasty  SURGEON: Rosealee Albee, MD  ASSISTANTS:   EBL: 100 cc  COMPONENTS:  Stryker - Accolade C Size 6 Stem Stryker - Unitrax 54mm head with +8 offset neck   INDICATIONS: Terry Collins is a 87 y.o. male who sustained a displaced femoral neck fracture after a fall. Risks and benefits of hip hemiarthroplasty were explained to the patient and/or family. Risks include but are not limited to bleeding, infection, injury to tissues, nerves, vessels, periprosthetic infection, dislocation, limb length discrepancy and risks of anesthesia. The patient and/or family understands these risks, has completed an informed consent and wishes to proceed.   PROCEDURE:  The patient was identified in the preoperative holding area and the operative extremity was marked.  The patient was then transferred to the operating room suite and mobilized from the hospital gurney to the operating room table. Spinal anesthesia was administered without complication. The patient was then transitioned to a lateral position.  All bony prominences were padded per protocol.  An axillary roll was placed.  Careful attention was paid to the contralateral side peroneal nerve, which was free from pressure with use of appropriate padding and blankets. A time-out was performed to confirm the patient's identity and the correct laterality of surgery. The patient was then prepped and draped in the usual sterile fashion. Appropriate pre-operative antibiotics were administered. Tranexamic acid was administered preoperatively.    An incision that centered on the posterior tip of the greater trochanter with a posterior curve was made. Dissection was carried down through the subcutaneous tissue.  Careful attention was made to maintain hemostasis using electrocautery.  Dissection  brought [place] to the level of the deep fascia where the gluteus maximus muscle and proximal portion of the IT band were identified.  The proximal region of the IT band was incised in linear fashion and this incision was extended proximally in a curvilinear fashion to split the gluteus maximus muscle parallel to its fibers to minimize bleeding.  This was accomplished using a combination bovie electrocautery as well as blunt dissection.  The trochanteric bursa was then visualized and dissected from anterior to posterior. A blunt homan retractor was placed beneath the abductors. The piriformis tendon and short external rotators were visualized. Bovie electrocautery was used to cut these with the capsule as one L-shaped flap. This was tagged at the corner with #5 Ethibond. At this point, the femoral neck fracture was visualized. An oscillating saw was used to make a new neck cut approximately 15mm above the lesser tuberosity with the use of a neck cut guide. The head was then freed from its remaining soft tissue attachments and measured. The head trial was then inserted into the acetabulum and sized appropriately.    We then turned our attention to preparing the femoral canal. First, a box cut was performed utilizing the box osteotome. A canal finder was inserted by hand and sequential broaching was then performed. The calcar planer was inserted onto the broach and used to smooth the calcar appropriately.  A trial stem, neutral neck, and head were inserted into the acetabulum and placed through range of motion. Intraoperative radiographs were taken to assess hardware position and leg lengths. The hip was again dislocated and the femoral trial components were removed. An appropriately sized cement restrictor was placed. The canal was irrigated with pulse lavage and dried. A cement gun was used  to place cement into the femoral canal. Cement was then pressurized. The actual stem was inserted into the femoral canal and then  driven onto the calcar. Position was held until the cement hardened. The trial head was then again inserted on the femoral component and found to be appropriate. Trial was removed and the permanent head/neck was Morse tapered onto the femoral stem and then reduced into the acetabulum.    The hip stability and length were reassessed and found to be satisfactory.  The wound was then copiously irrigated with normal saline solution. The tagged sutures of the capsule and piriformis were sewn to the gluteus medius tendon. This adequately closed the hip capsule. The IT band and gluteus maximus fascia were then closed with 0-Vicryl in a running, locked fashion. A mixture of Exparel and bupivicaine was administered.  The subdermal layer was closed with 2-0 Vicryl in a buried interrupted fashion. Skin was approximated with staples.  Sterile dressing was applied. A knee immobilizer was placed. The patient was mobilized from the lateral position back to supine on the operating room table and then awakened from general anesthesia without complication.   POSTOPERATIVE PLAN: The patient will be WBAT on operative extremity. Resume baseline Pradaxa on POD#1. IV Abx x 24 hours. PT/OT on POD#1. Posterior hip precautions.

## 2023-06-02 NOTE — Progress Notes (Signed)
The pt. has returned from the PACU. Bedside report completed with the PACU RN.  Vital signs are assessed and documented in the chart as per protocol. Safety protocols maintained during the shift, The pt.'s family members are at the bedside.  The pt. will continue to be assessed throughout the shift.

## 2023-06-02 NOTE — Consult Note (Signed)
PHARMACY - ANTICOAGULATION CONSULT NOTE  Pharmacy Consult for heparin infusion Indication: atrial fibrillation  Allergies  Allergen Reactions   Lactose Intolerance (Gi) Diarrhea    GI- upset   Tramadol Other (See Comments)    Confusion-hallucinations    Patient Measurements: Height: 6\' 2"  (188 cm) Weight: 74.8 kg (165 lb) IBW/kg (Calculated) : 82.2 Heparin Dosing Weight: 74.8 kg  Vital Signs: Temp: 97.4 F (36.3 C) (10/04 0409) Temp Source: Oral (10/04 0034) BP: 138/83 (10/04 0409) Pulse Rate: 57 (10/04 0409)  Labs: Recent Labs    06/01/23 0847 06/01/23 1242 06/02/23 0330  HGB 14.0  --  12.0*  HCT 43.1  --  35.3*  PLT 151  --  135*  APTT 38*  --   --   LABPROT 17.2*  --   --   INR 1.4*  --   --   HEPARINUNFRC  --   --  0.19*  CREATININE 0.93  --  0.89  TROPONINIHS 13 16  --     Estimated Creatinine Clearance: 64.2 mL/min (by C-G formula based on SCr of 0.89 mg/dL).   Medical History: Past Medical History:  Diagnosis Date   Anemia    Aortic valve insufficiency    Arthritis    hands   Atherosclerosis of aorta (HCC)    Carotid stenosis    History of kidney stones 2010   Mixed hyperlipidemia    Paroxysmal atrial fibrillation (HCC)    Venous insufficiency of both lower extremities     Medications:  Patient on Pradaxa 150 mg po BID, last dose 06/01/23 @ 0700  Assessment: 87 yo male presented to the ED after fall.  Imaging revealed left femoral neck fracture.  Pharmacy consulted to initiate heparin infusion.   Goal of Therapy:  Heparin level 0.3-0.7 units/ml Monitor platelets by anticoagulation protocol: Yes   10/04 0330 HL 0.19, subtherapeutic  Plan:  Bolus 2200 units x 1 Increase heparin infusion to 1200 units/hr Recheck HL 8 hours after rate change Daily CBC while on heparin  Otelia Sergeant, PharmD, [facility] 06/02/2023 4:22 AM

## 2023-06-02 NOTE — Anesthesia Preprocedure Evaluation (Addendum)
Anesthesia Evaluation  Patient identified by MRN, date of birth, ID band Patient awake    Reviewed: Allergy & Precautions, NPO status , Patient's Chart, lab work & pertinent test results  Airway Mallampati: III  TM Distance: <3 FB Neck ROM: full    Dental  (+) Caps   Pulmonary neg pulmonary ROS   Pulmonary exam normal breath sounds clear to auscultation       Cardiovascular Exercise Tolerance: Poor negative cardio ROS Normal cardiovascular exam+ dysrhythmias  Rhythm:Irregular Rate:Abnormal     Neuro/Psych negative neurological ROS  negative psych ROS   GI/Hepatic negative GI ROS, Neg liver ROS,,,  Endo/Other  negative endocrine ROS    Renal/GU negative Renal ROS     Musculoskeletal   Abdominal Normal abdominal exam  (+)   Peds negative pediatric ROS (+)  Hematology negative hematology ROS (+) Blood dyscrasia, anemia   Anesthesia Other Findings Past Medical History: No date: Anemia No date: Aortic valve insufficiency No date: Arthritis     Comment:  hands No date: Atherosclerosis of aorta (HCC) No date: Carotid stenosis 2010: History of kidney stones No date: Mixed hyperlipidemia No date: Paroxysmal atrial fibrillation (HCC) No date: Venous insufficiency of both lower extremities  Past Surgical History: 2019: CARDIAC ELECTROPHYSIOLOGY STUDY AND ABLATION     Comment:  [facility] 07/07/2021: CARDIOVERSION; N/A     Comment:  Procedure: CARDIOVERSION;  Surgeon: Lamar Blinks,               MD;  Location: [facility] ORS;  Service: Cardiovascular;                Laterality: N/A; No date: cardiovesion 2012: EYE SURGERY; Bilateral     Comment:  cataracts 2008: HERNIA REPAIR; Left     Comment:  inguinal 06/24/2016: INGUINAL HERNIA REPAIR; Right     Comment:  Procedure: HERNIA REPAIR INGUINAL ADULT;  Surgeon:               Nadeen Landau, MD;  Location: [facility] ORS;  Service:               General;  Laterality:  Right; 07/16/2020: TOTAL KNEE ARTHROPLASTY; Right     Comment:  Procedure: RIGHT TOTAL KNEE ARTHROPLASTY;  Surgeon:               Juanell Fairly, MD;  Location: [facility] ORS;  Service:               Orthopedics;  Laterality: Right; 01/20/2020: XI ROBOTIC ASSISTED INGUINAL HERNIA REPAIR WITH MESH;  Bilateral     Comment:  Procedure: XI ROBOTIC ASSISTED INGUINAL HERNIA REPAIR               WITH MESH;  Surgeon: Carolan Shiver, MD;                Location: [facility] ORS;  Service: General;  Laterality:               Bilateral; 12/16/2020: XI ROBOTIC ASSISTED INGUINAL HERNIA REPAIR WITH MESH;  Right     Comment:  Procedure: XI ROBOTIC ASSISTED INGUINAL HERNIA REPAIR               WITH MESH;  Surgeon: Carolan Shiver, MD;                Location: [facility] ORS;  Service: General;  Laterality:               Right;  BMI    Body Mass Index:  20.95 kg/m      Reproductive/Obstetrics negative OB ROS                             Anesthesia Physical Anesthesia Plan  ASA: 3  Anesthesia Plan: General   Post-op Pain Management:    Induction: Intravenous  PONV Risk Score and Plan: Ondansetron, Dexamethasone, Midazolam and Treatment may vary due to age or medical condition  Airway Management Planned: Oral ETT  Additional Equipment:   Intra-op Plan:   Post-operative Plan: Extubation in OR  Informed Consent: I have reviewed the patients History and Physical, chart, labs and discussed the procedure including the risks, benefits and alternatives for the proposed anesthesia with the patient or authorized representative who has indicated his/her understanding and acceptance.     Dental Advisory Given  Plan Discussed with: CRNA and Surgeon  Anesthesia Plan Comments:        Anesthesia Quick Evaluation

## 2023-06-02 NOTE — Transfer of Care (Signed)
Immediate Anesthesia Transfer of Care Note  Patient: Terry Collins  Procedure(s) Performed: ARTHROPLASTY BIPOLAR HIP (HEMIARTHROPLASTY) (Left: Hip)  Patient Location: PACU  Anesthesia Type:General  Level of Consciousness: drowsy and patient cooperative  Airway & Oxygen Therapy: Patient Spontanous Breathing and Patient connected to nasal cannula oxygen  Post-op Assessment: Report given to RN and Post -op Vital signs reviewed and stable  Post vital signs: Reviewed and stable  Last Vitals:  Vitals Value Taken Time  BP 111/68 06/02/23 1502  Temp    Pulse 72 06/02/23 1502  Resp 14 06/02/23 1502  SpO2 95 % 06/02/23 1502  Vitals shown include unfiled device data.  Last Pain:  Vitals:   06/02/23 1239  TempSrc: Temporal  PainSc: 0-No pain         Complications: No notable events documented.

## 2023-06-02 NOTE — H&P (Signed)
H&P reviewed. HR improved. Cleared by Cardiology team to proceed. No other significant changes noted.

## 2023-06-03 DIAGNOSIS — S72002A Fracture of unspecified part of neck of left femur, initial encounter for closed fracture: Secondary | ICD-10-CM | POA: Diagnosis not present

## 2023-06-03 LAB — CBC
HCT: 34.5 % — ABNORMAL LOW (ref 39.0–52.0)
Hemoglobin: 11.5 g/dL — ABNORMAL LOW (ref 13.0–17.0)
MCH: 34 pg (ref 26.0–34.0)
MCHC: 33.3 g/dL (ref 30.0–36.0)
MCV: 102.1 fL — ABNORMAL HIGH (ref 80.0–100.0)
Platelets: 138 10*3/uL — ABNORMAL LOW (ref 150–400)
RBC: 3.38 MIL/uL — ABNORMAL LOW (ref 4.22–5.81)
RDW: 14.1 % (ref 11.5–15.5)
WBC: 10.8 10*3/uL — ABNORMAL HIGH (ref 4.0–10.5)
nRBC: 0 % (ref 0.0–0.2)

## 2023-06-03 LAB — BASIC METABOLIC PANEL
Anion gap: 14 (ref 5–15)
BUN: 40 mg/dL — ABNORMAL HIGH (ref 8–23)
CO2: 25 mmol/L (ref 22–32)
Calcium: 8.4 mg/dL — ABNORMAL LOW (ref 8.9–10.3)
Chloride: 99 mmol/L (ref 98–111)
Creatinine, Ser: 1.36 mg/dL — ABNORMAL HIGH (ref 0.61–1.24)
GFR, Estimated: 51 mL/min — ABNORMAL LOW (ref >60)
Glucose, Bld: 156 mg/dL — ABNORMAL HIGH (ref 70–99)
Potassium: 3.3 mmol/L — ABNORMAL LOW (ref 3.5–5.1)
Sodium: 138 mmol/L (ref 135–145)

## 2023-06-03 MED ORDER — MELATONIN 5 MG PO TABS
5.0000 mg | ORAL_TABLET | Freq: Every day | ORAL | Status: DC
  Administered 2023-06-03 – 2023-06-04 (×2): 5 mg via ORAL
  Filled 2023-06-03 (×2): qty 1

## 2023-06-03 MED ORDER — POTASSIUM CHLORIDE CRYS ER 20 MEQ PO TBCR
20.0000 meq | EXTENDED_RELEASE_TABLET | Freq: Once | ORAL | Status: AC
  Administered 2023-06-03: 20 meq via ORAL
  Filled 2023-06-03: qty 1

## 2023-06-03 MED ORDER — OXYCODONE HCL 5 MG PO TABS: 2.5000 mg | ORAL_TABLET | ORAL | 0 refills | Status: DC | PRN

## 2023-06-03 NOTE — Discharge Instructions (Signed)

## 2023-06-03 NOTE — Evaluation (Signed)
Occupational Therapy Evaluation Patient Details Name: Terry Collins MRN: [id number] DOB: December 10, 1935 Today's Date: 06/03/2023   History of Present Illness 1 Day Post-Op Procedure(s) (LRB):  ARTHROPLASTY BIPOLAR HIP (HEMIARTHROPLASTY) (Left)  Principal Problem:    Hip fracture (HCC)  Active Problems:    Closed left hip fracture (HCC)    Atrial fibrillation with RVR (HCC)    Parkinson disease (HCC)     Estimated body mass index is 20.44 kg/m as calculated from the following:    Height as of this encounter: 6\' 2"  (1.88 m).    Weight as of this encounter: 72.2 kg.  Advance diet  Up with therapy     DVT Prophylaxis - TED hose and Pradaxa  Weight-Bearing as tolerated to left leg   Clinical Impression   Pt lives independent in [place] -diagnosis of Parkinson's - did not had fall until the other day in kitchen per pt and daughter- resulting in  ARTHROPLASTY BIPOLAR HIP (HEMIARTHROPLASTY) (Left) By DR Terry Collins - pt upon OT entering room in bed and wanted to go to [facility] - did not make in time and had to be cleaned by nsg- OT assist pt bed to [facility] - mod A - rigid and stiffness - fear for falling. Standing mod A with RW- need max v/c for hand placement - max A in ADL's for LB and UE - min A for grooming and eating SBA- pt had immobilizer on L knee- OT kept in place - but need to be clarified prior to next session . Pt wants to go [place] rehab at the [place] -and recommend for pt in the future resources for Parkinson's -boxing classes at [facility] for community Parkinson's pt's - pt can benefit from skilled OT services to improve transfer to bathroom and ADL's trng and AE trng  to return to living Independent.   aring as tolerated to left leg      If plan is discharge home, recommend the following:      Functional Status Assessment     Equipment Recommendations       Recommendations for Other Services       Precautions / Restrictions Precautions Precautions: Posterior Hip Required Braces or  Orthoses: Knee Immobilizer - Left (needs to be clarrified) Restrictions Weight Bearing Restrictions: Yes LUE Weight Bearing: Weight bearing as tolerated      Mobility Bed Mobility               General bed mobility comments: Mod A for supine to sit -leaning posteriorly - min A for LE    Transfers   Equipment used: Rolling walker (2 wheels)               General transfer comment: Sti <> stand Mod A - lean posteriorly - transferts rigid , shuffle ,lean posterioly - Mod A      Balance Overall balance assessment: Needs assistance Sitting-balance support: Bilateral upper extremity supported Sitting balance-Leahy Scale: Poor     Standing balance support: Bilateral upper extremity supported Standing balance-Leahy Scale: Poor                             ADL either performed or assessed with clinical judgement   ADL  General ADL Comments: Had 3 accidents of diarrhea -and depending on hygien and toiletting, Max A for UB and LB bathing anddressing - min A grooming and Eating SBA     Vision Patient Visual Report: No change from baseline       Perception         Praxis         Pertinent Vitals/Pain Pain Assessment Pain Assessment: No/denies pain     Extremity/Trunk Assessment Upper Extremity Assessment Upper Extremity Assessment: Overall WFL for tasks assessed (Weak and rigid - from Parkinsons)   Lower Extremity Assessment Lower Extremity Assessment: Defer to PT evaluation       Communication Communication Communication:  (softly - Parkinsons)   Cognition     Overall Cognitive Status: Within Functional Limits for tasks assessed                                       General Comments       Exercises Other Exercises Other Exercises: Pt had 3 episodes of diarrhea and OT assist staff with pt standing , hygiene- pt need Max v/c for hand placement - sitting without  reaching back - Pakinsons and fear of falling   Shoulder Instructions      Home Living                                   Additional Comments: [place]      Prior Functioning/Environment Prior Level of Function : Independent/Modified Independent             Mobility Comments: [place] when going to store and night time to [employer] ADLs Comments: aid every am 8-2 - drive him , do laundry and things around the house        OT Problem List: Decreased strength;Impaired balance (sitting and/or standing);Decreased safety awareness;Decreased knowledge of use of DME or AE;Decreased activity tolerance      OT Treatment/Interventions:      OT Goals(Current goals can be found in the care plan section) Acute Rehab OT Goals Patient Stated Goal: going to rehab OT Goal Formulation: With patient/family Time For Goal Achievement: 06/17/23 Potential to Achieve Goals: Good  OT Frequency:      Co-evaluation              AM-PAC OT "6 Clicks" Daily Activity     Outcome Measure                 End of Session Equipment Utilized During Treatment: Gait belt;Rolling walker (2 wheels)  Activity Tolerance: Other (comment) (lmited but pakinsons -) Patient left: in chair;with family/visitor present  OT Visit Diagnosis: Unsteadiness on feet (R26.81);Muscle weakness (generalized) (M62.81);History of falling (Z91.81);Repeated falls (R29.6)                Time: 6063-0160 OT Time Calculation (min): 45 min Charges:  OT General Charges $OT Visit: 1 Visit OT Evaluation $OT Eval Low Complexity: 1 Low OT Treatments $Self Care/Home Management : 8-22 mins    Terry Collins OTR/L,LT 06/03/2023, 4:49 PM

## 2023-06-03 NOTE — Progress Notes (Signed)
  Subjective: 1 Day Post-Op Procedure(s) (LRB): ARTHROPLASTY BIPOLAR HIP (HEMIARTHROPLASTY) (Left) Patient reports pain as mild.  Good communication.  Able to answer questions appropriately. Patient is well, and has had no acute complaints or problems Plan is to go Rehab after hospital stay. Negative for chest pain and shortness of breath Fever: no Gastrointestinal: Negative for nausea and vomiting  Objective: Vital signs in last 24 hours: Temp:  [97.4 F (36.3 C)-98.9 F (37.2 C)] 98 F (36.7 C) (10/05 0456) Pulse Rate:  [54-114] 86 (10/05 0456) Resp:  [12-27] 20 (10/05 0456) BP: (100-156)/(65-94) 127/86 (10/05 0456) SpO2:  [93 %-100 %] 93 % (10/05 0456) Weight:  [72.2 kg-74 kg] 72.2 kg (10/05 0500)  Intake/Output from previous day:  Intake/Output Summary (Last 24 hours) at 06/03/2023 0654 Last data filed at 06/03/2023 0515 Gross per 24 hour  Intake 900 ml  Output 901 ml  Net -1 ml    Intake/Output this shift: Total I/O In: -  Out: 501 [Urine:501]  Labs: Recent Labs    06/01/23 0847 06/02/23 0330 06/03/23 0429  HGB 14.0 12.0* 11.5*   Recent Labs    06/02/23 0330 06/03/23 0429  WBC 7.6 10.8*  RBC 3.55* 3.38*  HCT 35.3* 34.5*  PLT 135* 138*   Recent Labs    06/02/23 0330 06/03/23 0429  NA 139 138  K 3.7 3.3*  CL 101 99  CO2 27 25  BUN 26* 40*  CREATININE 0.89 1.36*  GLUCOSE 100* 156*  CALCIUM 8.5* 8.4*   Recent Labs    06/01/23 0847  INR 1.4*     EXAM General - Patient is Alert and Oriented Extremity - Neurovascular intact Sensation intact distally Dorsiflexion/Plantar flexion intact Compartment soft Dressing/Incision - clean, dry, scant blood tinged drainage Motor Function - intact, moving foot and toes well on exam.   Past Medical History:  Diagnosis Date   Anemia    Aortic valve insufficiency    Arthritis    hands   Atherosclerosis of aorta (HCC)    Carotid stenosis    History of kidney stones 2010   Mixed hyperlipidemia     Paroxysmal atrial fibrillation (HCC)    Venous insufficiency of both lower extremities     Assessment/Plan: 1 Day Post-Op Procedure(s) (LRB): ARTHROPLASTY BIPOLAR HIP (HEMIARTHROPLASTY) (Left) Principal Problem:   Hip fracture (HCC) Active Problems:   Closed left hip fracture (HCC)   Atrial fibrillation with RVR (HCC)   Parkinson disease (HCC)  Estimated body mass index is 20.44 kg/m as calculated from the following:   Height as of this encounter: 6\' 2"  (1.88 m).   Weight as of this encounter: 72.2 kg. Advance diet Up with therapy  DVT Prophylaxis - TED hose and Pradaxa Weight-Bearing as tolerated to left leg  Dedra Skeens, PA-C Orthopaedic Surgery 06/03/2023, 6:54 AM

## 2023-06-03 NOTE — Progress Notes (Signed)
PROGRESS NOTE    Terry Collins  WCB:[id number] DOB: December 24, 1935 DOA: 06/01/2023 PCP: Barbette Reichmann, MD   Assessment & Plan:   Principal Problem:   Hip fracture (HCC) Active Problems:   Closed left hip fracture (HCC)   Atrial fibrillation with RVR (HCC)   Parkinson disease (HCC)  Assessment and Plan: Left femoral neck fracture: s/p left hip hemiarthroplasty. Holding pradaxa. Oxy, dilaudid prn for pain    A-fib: with RVR. Persistent  a.fib.. Continue on metoprolol, pradaxa. Echo shows EF 50-55%, diastolic function is indeterminate, no regional wall motion abnormalities, mod-severe AR, no atrial level shunts detected   Aortic stenosis: continue w/ supportive care   Chronic diastolic CHF: appears euvolemic. Holding lasix. Monitor I/Os    Parkinson's disease: continue on home dose of sinemet   Thrombocytopenia: etiology unclear. Trending up today.   Hypokalemia: potassium given    DVT prophylaxis: IV heparin  Code Status: full  Family Communication: discussed pt's care w/ pt's family at bedside and answered their questions  Disposition Plan: depends on PT/OT recs   Level of care: Telemetry Medical Status is: Inpatient Remains inpatient appropriate because: severity of illness     Consultants:  Ortho surg  Cardio   Procedures:   Antimicrobials:    Subjective: Pt is confused  Objective: Vitals:   06/02/23 2303 06/03/23 0456 06/03/23 0500 06/03/23 0800  BP: 136/80 127/86  (!) 136/95  Pulse: 70 86  83  Resp: 18 20  16   Temp: 98 F (36.7 C) 98 F (36.7 C)  97.8 F (36.6 C)  TempSrc:      SpO2: 98% 93%  94%  Weight:   72.2 kg   Height:        Intake/Output Summary (Last 24 hours) at 06/03/2023 0819 Last data filed at 06/03/2023 0515 Gross per 24 hour  Intake 900 ml  Output 901 ml  Net -1 ml   Filed Weights   06/01/23 0839 06/02/23 1239 06/03/23 0500  Weight: 74.8 kg 74 kg 72.2 kg    Examination:  General exam: Appears confused Respiratory  system: clear breath sounds b/l  Cardiovascular system: S1/S2+. No rubs or clicks  Gastrointestinal system: abd is soft, NT, ND & hypoactive bowel sounds Central nervous system: alert & awake. Moves all extremities  Psychiatry: judgement and insight appears poor.     Data Reviewed: I have personally reviewed following labs and imaging studies  CBC: Recent Labs  Lab 06/01/23 0847 06/02/23 0330 06/03/23 0429  WBC 7.9 7.6 10.8*  NEUTROABS 6.9  --   --   HGB 14.0 12.0* 11.5*  HCT 43.1 35.3* 34.5*  MCV 103.1* 99.4 102.1*  PLT 151 135* 138*   Basic Metabolic Panel: Recent Labs  Lab 06/01/23 0847 06/02/23 0330 06/03/23 0429  NA 140 139 138  K 3.7 3.7 3.3*  CL 100 101 99  CO2 32 27 25  GLUCOSE 116* 100* 156*  BUN 25* 26* 40*  CREATININE 0.93 0.89 1.36*  CALCIUM 9.2 8.5* 8.4*   GFR: Estimated Creatinine Clearance: 40.6 mL/min (A) (by C-G formula based on SCr of 1.36 mg/dL (H)). Liver Function Tests: No results for input(s): "AST", "ALT", "ALKPHOS", "BILITOT", "PROT", "ALBUMIN" in the last 168 hours. No results for input(s): "LIPASE", "AMYLASE" in the last 168 hours. No results for input(s): "AMMONIA" in the last 168 hours. Coagulation Profile: Recent Labs  Lab 06/01/23 0847  INR 1.4*   Cardiac Enzymes: No results for input(s): "CKTOTAL", "CKMB", "CKMBINDEX", "TROPONINI" in the last 168 hours. BNP (  last 3 results) No results for input(s): "PROBNP" in the last 8760 hours. HbA1C: No results for input(s): "HGBA1C" in the last 72 hours. CBG: No results for input(s): "GLUCAP" in the last 168 hours. Lipid Profile: No results for input(s): "CHOL", "HDL", "LDLCALC", "TRIG", "CHOLHDL", "LDLDIRECT" in the last 72 hours. Thyroid Function Tests: No results for input(s): "TSH", "T4TOTAL", "FREET4", "T3FREE", "THYROIDAB" in the last 72 hours. Anemia Panel: No results for input(s): "VITAMINB12", "FOLATE", "FERRITIN", "TIBC", "IRON", "RETICCTPCT" in the last 72 hours. Sepsis  Labs: No results for input(s): "PROCALCITON", "LATICACIDVEN" in the last 168 hours.  Recent Results (from the past 240 hour(s))  Surgical PCR screen     Status: None   Collection Time: 06/02/23  2:16 AM   Specimen: Nasal Mucosa; Nasal Swab  Result Value Ref Range Status   MRSA, PCR NEGATIVE NEGATIVE Final   Staphylococcus aureus NEGATIVE NEGATIVE Final    Comment: (NOTE) The Xpert SA Assay (FDA approved for NASAL specimens in patients 81 years of age and older), is one component of a comprehensive surveillance program. It is not intended to diagnose infection nor to guide or monitor treatment. Performed at [facility], [address] [place], [place] [address]          Radiology Studies: DG Pelvis Portable  Result Date: 06/02/2023 CLINICAL DATA:  Status post left hip hemiarthroplasty. EXAM: PORTABLE PELVIS 1-2 VIEWS COMPARISON:  Left hip intraoperative radiograph 06/02/2023, pelvis and left hip radiographs 06/01/2023 FINDINGS: Interval bipolar left hip hemiarthroplasty. No perihardware lucency is seen to indicate hardware failure or loosening. There are again surgical clips overlying the prostate. Moderate to severe superomedial right femoroacetabular joint space narrowing. No acute fracture or dislocation. Effective left hip postoperative subcutaneous air. Lateral left hip surgical skin staples. IMPRESSION: Interval left hip bipolar hemiarthroplasty. No evidence of hardware failure or loosening. Electronically Signed   By: Neita Garnet M.D.   On: 06/02/2023 18:26   DG Pelvis Portable  Result Date: 06/02/2023 CLINICAL DATA:  Elective surgery. Intraoperative film. Left hip arthroplasty. EXAM: PORTABLE PELVIS 1-2 VIEWS COMPARISON:  Pelvis and left hip radiographs 06/01/2023 FINDINGS: Single frontal view of the bilateral hips during surgery. The patient is undergoing total left hip arthroplasty with the femoral stem in place. There is expected left hip intra-articular air.  Diffuse decreased bone mineralization. Moderate superomedial right femoroacetabular joint space narrowing. Surgical clips overlie the prostate. IMPRESSION: Intraoperative radiograph demonstrating left femoral stem placement for total left hip arthroplasty. Electronically Signed   By: Neita Garnet M.D.   On: 06/02/2023 16:33   ECHOCARDIOGRAM COMPLETE  Result Date: 06/01/2023    ECHOCARDIOGRAM REPORT   Patient Name:   AGAM EVELAND Date of Exam: 06/01/2023 Medical Rec #:  [id number]     Height:       74.0 in Accession #:    [id number]    Weight:       165.0 lb Date of Birth:  Jan 21, 1936     BSA:          2.003 m Patient Age:    87 years      BP:           149/91 mmHg Patient Gender: M             HR:           84 bpm. Exam Location:  [facility] Procedure: 2D Echo, Cardiac Doppler and Color Doppler Indications:     Aortic regurgitation I35.1  History:  Patient has no prior history of Echocardiogram examinations.                  Paroxysmal Afib, Aortic valve insufficiency.  Sonographer:     Cristela Blue Referring Phys:  [id number] CARALYN HUDSON Diagnosing Phys: Mellody Drown Alluri IMPRESSIONS  1. Left ventricular ejection fraction, by estimation, is 50 to 55% with beat to beat variability. The left ventricle has normal function. The left ventricle has no regional wall motion abnormalities. There is moderate concentric left ventricular hypertrophy. Left ventricular diastolic parameters are indeterminate.  2. Right ventricular systolic function was not well visualized.  3. Right atrial size was mildly dilated.  4. The mitral valve is normal in structure. Trivial mitral valve regurgitation.  5. The aortic valve is tricuspid. There is mild thickening of the aortic valve. Aortic valve regurgitation is moderate to severe.  6. The inferior vena cava is dilated in size with <50% respiratory variability, suggesting right atrial pressure of 15 mmHg. FINDINGS  Left Ventricle: Left ventricular ejection fraction, by estimation, is 50 to  55%. The left ventricle has normal function. The left ventricle has no regional wall motion abnormalities. The left ventricular internal cavity size was normal in size. There is  moderate concentric left ventricular hypertrophy. Left ventricular diastolic parameters are indeterminate. Right Ventricle: Right vetricular wall thickness was not well visualized. Right ventricular systolic function was not well visualized. Left Atrium: Left atrial size was not well visualized. Right Atrium: Right atrial size was mildly dilated. Pericardium: There is no evidence of pericardial effusion. Mitral Valve: The mitral valve is normal in structure. There is mild thickening of the mitral valve leaflet(s). Trivial mitral valve regurgitation. Tricuspid Valve: The tricuspid valve is normal in structure. Tricuspid valve regurgitation is mild. Aortic Valve: The aortic valve is tricuspid. There is mild thickening of the aortic valve. Aortic valve regurgitation is moderate to severe. Aortic valve mean gradient measures 3.0 mmHg. Aortic valve peak gradient measures 6.0 mmHg. Aortic valve area, by  VTI measures 4.21 cm. Pulmonic Valve: The pulmonic valve was normal in structure. Pulmonic valve regurgitation is mild. Aorta: The aortic root and ascending aorta are structurally normal, with no evidence of dilitation. Venous: The inferior vena cava is dilated in size with less than 50% respiratory variability, suggesting right atrial pressure of 15 mmHg. IAS/Shunts: No atrial level shunt detected by color flow Doppler.  LEFT VENTRICLE PLAX 2D LVIDd:         2.90 cm LVIDs:         2.10 cm LV PW:         1.60 cm LV IVS:        1.70 cm LVOT diam:     2.30 cm LV SV:         66 LV SV Index:   33 LVOT Area:     4.15 cm  RIGHT VENTRICLE RV Basal diam:  3.80 cm RV Mid diam:    3.60 cm RV S prime:     8.16 cm/s TAPSE (M-mode): 1.8 cm LEFT ATRIUM           Index        RIGHT ATRIUM           Index LA diam:      3.00 cm 1.50 cm/m   RA Area:     26.30  cm LA Vol (A2C): 55.4 ml 27.66 ml/m  RA Volume:   75.90 ml  37.90 ml/m LA Vol (A4C): 48.7 ml 24.32 ml/m  AORTIC VALVE AV Area (Vmax):    3.15 cm AV Area (Vmean):   3.35 cm AV Area (VTI):     4.21 cm AV Vmax:           122.00 cm/s AV Vmean:          85.600 cm/s AV VTI:            0.158 m AV Peak Grad:      6.0 mmHg AV Mean Grad:      3.0 mmHg LVOT Vmax:         92.60 cm/s LVOT Vmean:        69.000 cm/s LVOT VTI:          0.160 m LVOT/AV VTI ratio: 1.01 AI PHT:            422 msec  AORTA Ao Root diam: 3.70 cm MITRAL VALVE               TRICUSPID VALVE MV Area (PHT): 6.37 cm    TR Peak grad:   12.0 mmHg MV Decel Time: 119 msec    TR Vmax:        173.00 cm/s MV E velocity: 89.10 cm/s                            SHUNTS                            Systemic VTI:  0.16 m                            Systemic Diam: 2.30 cm Windell Norfolk Electronically signed by Windell Norfolk Signature Date/Time: 06/01/2023/2:04:32 PM    Final    DG Hip Unilat With Pelvis 2-3 Views Left  Result Date: 06/01/2023 CLINICAL DATA:  Pain after fall EXAM: DG HIP (WITH OR WITHOUT PELVIS) 3V LEFT COMPARISON:  None Available. FINDINGS: Mildly displaced and angulated subcapital left femoral neck fracture. Osteopenia. Moderate joint space loss of the right hip greater than left. Degenerative changes of the sacroiliac joints as well with some osteophytes. Chondrocalcinosis of the pubic symphysis. Vascular calcifications. Degenerative changes of the visualized portions of the lumbar spine. IMPRESSION: Osteopenia with mildly displaced subcapital left femoral neck fracture. Degenerative changes of the right hip. Electronically Signed   By: Karen Kays M.D.   On: 06/01/2023 11:14   DG Chest 1 View  Result Date: 06/01/2023 CLINICAL DATA:  Pain after fall EXAM: CHEST  1 VIEW COMPARISON:  07/07/2020 FINDINGS: Enlarged cardiopericardial silhouette with vascular congestion and some interstitial edema. Apical pleural thickening. No pneumothorax.  Question tiny left effusion. Overlapping cardiac leads. IMPRESSION: Enlarged heart with vascular congestion and some interstitial edema. Question tiny left effusion. Recommend follow-up Electronically Signed   By: Karen Kays M.D.   On: 06/01/2023 11:13        Scheduled Meds:  acetaminophen  1,000 mg Oral Q8H   carbidopa-levodopa  1 tablet Oral QID   carbidopa-levodopa  2 tablet Oral Q0600   cycloSPORINE  1 drop Both Eyes BID   dabigatran  150 mg Oral BID   docusate sodium  100 mg Oral BID   feeding supplement  237 mL Oral TID BM   furosemide  40 mg Oral Daily   gabapentin  100 mg Oral BID   ketorolac  7.5 mg Intravenous Q6H   metoprolol  tartrate  50 mg Oral BID   multivitamin with minerals  1 tablet Oral Daily   neomycin-bacitracin-polymyxin   Topical BID   senna  1 tablet Oral BID   Continuous Infusions:  sodium chloride 75 mL/hr at 06/03/23 0525     LOS: 2 days      Charise Killian, MD [facility] Pager [phone]  If 7PM-7AM, please contact night-coverage 06/03/2023, 8:19 AM

## 2023-06-03 NOTE — Progress Notes (Signed)
PT Cancellation Note  Patient Details Name: Terry Collins MRN: [id number] DOB: 08/05/1936   Cancelled Treatment:    Reason Eval/Treat Not Completed: Other (comment) (Patient triaged due to high volume and will be prioritized for eval tomorrow AM, message in to Dr. Allena Katz to clarify use of knee immobilizer)   Luretha Murphy. Ilsa Iha, PT, DPT 06/03/23, 5:05 PM

## 2023-06-04 DIAGNOSIS — S72002A Fracture of unspecified part of neck of left femur, initial encounter for closed fracture: Secondary | ICD-10-CM | POA: Diagnosis not present

## 2023-06-04 LAB — CBC
HCT: 35.6 % — ABNORMAL LOW (ref 39.0–52.0)
Hemoglobin: 11.9 g/dL — ABNORMAL LOW (ref 13.0–17.0)
MCH: 33.6 pg (ref 26.0–34.0)
MCHC: 33.4 g/dL (ref 30.0–36.0)
MCV: 100.6 fL — ABNORMAL HIGH (ref 80.0–100.0)
Platelets: 144 10*3/uL — ABNORMAL LOW (ref 150–400)
RBC: 3.54 MIL/uL — ABNORMAL LOW (ref 4.22–5.81)
RDW: 14.2 % (ref 11.5–15.5)
WBC: 11.4 10*3/uL — ABNORMAL HIGH (ref 4.0–10.5)
nRBC: 0 % (ref 0.0–0.2)

## 2023-06-04 LAB — BASIC METABOLIC PANEL
Anion gap: 10 (ref 5–15)
BUN: 44 mg/dL — ABNORMAL HIGH (ref 8–23)
CO2: 26 mmol/L (ref 22–32)
Calcium: 8.8 mg/dL — ABNORMAL LOW (ref 8.9–10.3)
Chloride: 104 mmol/L (ref 98–111)
Creatinine, Ser: 1.13 mg/dL (ref 0.61–1.24)
GFR, Estimated: >60 mL/min (ref >60)
Glucose, Bld: 128 mg/dL — ABNORMAL HIGH (ref 70–99)
Potassium: 3.8 mmol/L (ref 3.5–5.1)
Sodium: 140 mmol/L (ref 135–145)

## 2023-06-04 NOTE — Progress Notes (Signed)
  Subjective: 2 Days Post-Op Procedure(s) (LRB): ARTHROPLASTY BIPOLAR HIP (HEMIARTHROPLASTY) (Left) Patient reports pain as mild.  Good communication.  Able to answer questions appropriately. Patient is well, and has had no acute complaints or problems Plan is to go Rehab after hospital stay. Negative for chest pain and shortness of breath Fever: no Gastrointestinal: Negative for nausea and vomiting  Objective: Vital signs in last 24 hours: Temp:  [98 F (36.7 C)-98.5 F (36.9 C)] 98.5 F (36.9 C) (10/06 1531) Pulse Rate:  [69-87] 77 (10/06 1531) Resp:  [14-20] 16 (10/06 1531) BP: (136-141)/(76-95) 141/93 (10/06 1531) SpO2:  [94 %-100 %] 100 % (10/06 1531)  Intake/Output from previous day:  Intake/Output Summary (Last 24 hours) at 06/04/2023 1715 Last data filed at 06/04/2023 1532 Gross per 24 hour  Intake 1540.58 ml  Output 1225 ml  Net 315.58 ml    Intake/Output this shift: Total I/O In: 20 [P.O.:20] Out: 800 [Urine:800]  Labs: Recent Labs    06/02/23 0330 06/03/23 0429 06/04/23 0437  HGB 12.0* 11.5* 11.9*   Recent Labs    06/03/23 0429 06/04/23 0437  WBC 10.8* 11.4*  RBC 3.38* 3.54*  HCT 34.5* 35.6*  PLT 138* 144*   Recent Labs    06/03/23 0429 06/04/23 0437  NA 138 140  K 3.3* 3.8  CL 99 104  CO2 25 26  BUN 40* 44*  CREATININE 1.36* 1.13  GLUCOSE 156* 128*  CALCIUM 8.4* 8.8*   No results for input(s): "LABPT", "INR" in the last 72 hours.    EXAM General - Patient is Alert and Oriented Extremity - Neurovascular intact Sensation intact distally Dorsiflexion/Plantar flexion intact Compartment soft Dressing/Incision - clean, dry, scant blood tinged drainage Motor Function - intact, moving foot and toes well on exam.  Ambulated 3 feet to the chair with physical therapy and Occupational Therapy  Past Medical History:  Diagnosis Date   Anemia    Aortic valve insufficiency    Arthritis    hands   Atherosclerosis of aorta (HCC)    Carotid  stenosis    History of kidney stones 2010   Mixed hyperlipidemia    Paroxysmal atrial fibrillation (HCC)    Venous insufficiency of both lower extremities     Assessment/Plan: 2 Days Post-Op Procedure(s) (LRB): ARTHROPLASTY BIPOLAR HIP (HEMIARTHROPLASTY) (Left) Principal Problem:   Hip fracture (HCC) Active Problems:   Closed left hip fracture (HCC)   Atrial fibrillation with RVR (HCC)   Parkinson disease (HCC)  Estimated body mass index is 20.44 kg/m as calculated from the following:   Height as of this encounter: 6\' 2"  (1.88 m).   Weight as of this encounter: 72.2 kg. Advance diet Up with therapy  DVT Prophylaxis - TED hose and Pradaxa Weight-Bearing as tolerated to left leg  Discharge planning: Follow-up at [facility] clinic in 2 weeks for staple removal and x-rays of the left hip.  No showering.  Dedra Skeens, PA-C Orthopaedic Surgery 06/04/2023, 5:15 PM

## 2023-06-04 NOTE — Plan of Care (Signed)
°  Problem: Nutrition: °Goal: Adequate nutrition will be maintained °Outcome: Progressing °  °Problem: Coping: °Goal: Level of anxiety will decrease °Outcome: Progressing °  °Problem: Safety: °Goal: Ability to remain free from injury will improve °Outcome: Progressing °  °

## 2023-06-04 NOTE — Progress Notes (Signed)
PROGRESS NOTE    Terry Collins  HYQ:[id number] DOB: 06-Aug-1936 DOA: 06/01/2023 PCP: Barbette Reichmann, MD   Assessment & Plan:   Principal Problem:   Hip fracture (HCC) Active Problems:   Closed left hip fracture (HCC)   Atrial fibrillation with RVR (HCC)   Parkinson disease (HCC)  Assessment and Plan: Left femoral neck fracture: s/p left hip hemiarthroplasty. Continue on pradaxa. Oxy, dilaudid prn for pain    A-fib: with RVR. Persistent  a.fib.. Continue on pradaxa, metoprolol. Echo shows EF 50-55%, diastolic function is indeterminate, no regional wall motion abnormalities, mod-severe AR, no atrial level shunts detected   Aortic stenosis: continue w/ supportive care   Chronic diastolic CHF: appears euvolemic. Holding lasix. Monitor I/Os    Parkinson's disease: continue on home dose of sinemet   Thrombocytopenia: etiology unclear. Continues to trend up  Hypokalemia: WNL today    DVT prophylaxis: pradaxa  Code Status: full  Family Communication: discussed pt's care w/ pt's family at bedside and answered their questions  Disposition Plan: likely d/c to SNF   Level of care: Telemetry Medical Status is: Inpatient Remains inpatient appropriate because: severity of illness     Consultants:  Ortho surg  Cardio   Procedures:   Antimicrobials:    Subjective: Pt is fatigued   Objective: Vitals:   06/03/23 0500 06/03/23 0800 06/03/23 2015 06/04/23 0802  BP:  (!) 136/95 (!) 140/95 136/76  Pulse:  83 69 87  Resp:  16 20 14   Temp:  97.8 F (36.6 C) 98 F (36.7 C) 98.2 F (36.8 C)  TempSrc:      SpO2:  94% 94% 96%  Weight: 72.2 kg     Height:        Intake/Output Summary (Last 24 hours) at 06/04/2023 0827 Last data filed at 06/04/2023 0500 Gross per 24 hour  Intake 1520.58 ml  Output 725 ml  Net 795.58 ml   Filed Weights   06/01/23 0839 06/02/23 1239 06/03/23 0500  Weight: 74.8 kg 74 kg 72.2 kg    Examination:  General exam: appears calm &  comfortable  Respiratory system: clear breath sounds b/l Cardiovascular system: S1 & S2+. No gallops or clicks Gastrointestinal system: abd is soft, NT, ND & hypoactive bowel sonds  Central nervous system: moves all extremities  Psychiatry: judgement and insight appears poor     Data Reviewed: I have personally reviewed following labs and imaging studies  CBC: Recent Labs  Lab 06/01/23 0847 06/02/23 0330 06/03/23 0429 06/04/23 0437  WBC 7.9 7.6 10.8* 11.4*  NEUTROABS 6.9  --   --   --   HGB 14.0 12.0* 11.5* 11.9*  HCT 43.1 35.3* 34.5* 35.6*  MCV 103.1* 99.4 102.1* 100.6*  PLT 151 135* 138* 144*   Basic Metabolic Panel: Recent Labs  Lab 06/01/23 0847 06/02/23 0330 06/03/23 0429 06/04/23 0437  NA 140 139 138 140  K 3.7 3.7 3.3* 3.8  CL 100 101 99 104  CO2 32 27 25 26   GLUCOSE 116* 100* 156* 128*  BUN 25* 26* 40* 44*  CREATININE 0.93 0.89 1.36* 1.13  CALCIUM 9.2 8.5* 8.4* 8.8*   GFR: Estimated Creatinine Clearance: 48.8 mL/min (by C-G formula based on SCr of 1.13 mg/dL). Liver Function Tests: No results for input(s): "AST", "ALT", "ALKPHOS", "BILITOT", "PROT", "ALBUMIN" in the last 168 hours. No results for input(s): "LIPASE", "AMYLASE" in the last 168 hours. No results for input(s): "AMMONIA" in the last 168 hours. Coagulation Profile: Recent Labs  Lab 06/01/23 [phone]  INR 1.4*   Cardiac Enzymes: No results for input(s): "CKTOTAL", "CKMB", "CKMBINDEX", "TROPONINI" in the last 168 hours. BNP (last 3 results) No results for input(s): "PROBNP" in the last 8760 hours. HbA1C: No results for input(s): "HGBA1C" in the last 72 hours. CBG: No results for input(s): "GLUCAP" in the last 168 hours. Lipid Profile: No results for input(s): "CHOL", "HDL", "LDLCALC", "TRIG", "CHOLHDL", "LDLDIRECT" in the last 72 hours. Thyroid Function Tests: No results for input(s): "TSH", "T4TOTAL", "FREET4", "T3FREE", "THYROIDAB" in the last 72 hours. Anemia Panel: No results for  input(s): "VITAMINB12", "FOLATE", "FERRITIN", "TIBC", "IRON", "RETICCTPCT" in the last 72 hours. Sepsis Labs: No results for input(s): "PROCALCITON", "LATICACIDVEN" in the last 168 hours.  Recent Results (from the past 240 hour(s))  Surgical PCR screen     Status: None   Collection Time: 06/02/23  2:16 AM   Specimen: Nasal Mucosa; Nasal Swab  Result Value Ref Range Status   MRSA, PCR NEGATIVE NEGATIVE Final   Staphylococcus aureus NEGATIVE NEGATIVE Final    Comment: (NOTE) The Xpert SA Assay (FDA approved for NASAL specimens in patients 27 years of age and older), is one component of a comprehensive surveillance program. It is not intended to diagnose infection nor to guide or monitor treatment. Performed at [facility], [address] [place], [place] [address]          Radiology Studies: DG Pelvis Portable  Result Date: 06/02/2023 CLINICAL DATA:  Status post left hip hemiarthroplasty. EXAM: PORTABLE PELVIS 1-2 VIEWS COMPARISON:  Left hip intraoperative radiograph 06/02/2023, pelvis and left hip radiographs 06/01/2023 FINDINGS: Interval bipolar left hip hemiarthroplasty. No perihardware lucency is seen to indicate hardware failure or loosening. There are again surgical clips overlying the prostate. Moderate to severe superomedial right femoroacetabular joint space narrowing. No acute fracture or dislocation. Effective left hip postoperative subcutaneous air. Lateral left hip surgical skin staples. IMPRESSION: Interval left hip bipolar hemiarthroplasty. No evidence of hardware failure or loosening. Electronically Signed   By: Neita Garnet M.D.   On: 06/02/2023 18:26   DG Pelvis Portable  Result Date: 06/02/2023 CLINICAL DATA:  Elective surgery. Intraoperative film. Left hip arthroplasty. EXAM: PORTABLE PELVIS 1-2 VIEWS COMPARISON:  Pelvis and left hip radiographs 06/01/2023 FINDINGS: Single frontal view of the bilateral hips during surgery. The patient is undergoing total  left hip arthroplasty with the femoral stem in place. There is expected left hip intra-articular air. Diffuse decreased bone mineralization. Moderate superomedial right femoroacetabular joint space narrowing. Surgical clips overlie the prostate. IMPRESSION: Intraoperative radiograph demonstrating left femoral stem placement for total left hip arthroplasty. Electronically Signed   By: Neita Garnet M.D.   On: 06/02/2023 16:33        Scheduled Meds:  acetaminophen  1,000 mg Oral Q8H   carbidopa-levodopa  1 tablet Oral QID   carbidopa-levodopa  2 tablet Oral Q0600   cycloSPORINE  1 drop Both Eyes BID   dabigatran  150 mg Oral BID   docusate sodium  100 mg Oral BID   feeding supplement  237 mL Oral TID BM   furosemide  40 mg Oral Daily   gabapentin  100 mg Oral BID   melatonin  5 mg Oral QHS   metoprolol tartrate  50 mg Oral BID   multivitamin with minerals  1 tablet Oral Daily   neomycin-bacitracin-polymyxin   Topical BID   senna  1 tablet Oral BID   Continuous Infusions:  sodium chloride 75 mL/hr at 06/03/23 0525     LOS:  3 days      Charise Killian, MD [facility] Pager [phone]  If 7PM-7AM, please contact night-coverage 06/04/2023, 8:27 AM

## 2023-06-04 NOTE — Evaluation (Addendum)
Physical Therapy Evaluation Patient Details Name: Terry Collins MRN: [id number] DOB: 09/21/1935 Today's Date: 06/04/2023  History of Present Illness  Terry Collins is a 87 y.o. male who sustained a displaced femoral neck fracture after a fall. He under went left hip bipolar hemiarthroplasty the left  hip on 06/02/2023. Also noted for afib with RVR. PMH of CHF, Parkinsons, R TKA.   Clinical Impression  Patient wakes to voice, oriented to self and family in room. Pt pt/family at baseline the pt lives at an independent living facility, I for ADLs, will use a rollator for some community ambulation, and has an aide dialy for company/driving/errands as needed.   Seen as PT/OT co treat to maximize function and safety. ModAx2 for supine <> sit. Pt very stiff/rigid (family reported he has not had his Parkinsons medication this AM) but able to attempt initiation of activity. Sit <> stand with modAx2 and stabilization of RW. He was able to take several limited steps to the L as well as forwards/backwards with constant min A 1-2 due to posterior lean. Returned to supine and RN in room to assess LLE due to noted swelling (KI doffed and donned to allow assessment, totalA).  Overall the patient demonstrated deficits (see "PT Problem List") that impede the patient's functional abilities, safety, and mobility and would benefit from skilled PT intervention.          If plan is discharge home, recommend the following: Two people to help with walking and/or transfers;Two people to help with bathing/dressing/bathroom;Direct supervision/assist for medications management;Help with stairs or ramp for entrance;Assist for transportation;Assistance with cooking/housework   Can travel by private vehicle   No    Equipment Recommendations Other (comment) (TBD at next venue of care)  Recommendations for Other Services       Functional Status Assessment Patient has had a recent decline in their functional status and  demonstrates the ability to make significant improvements in function in a reasonable and predictable amount of time.     Precautions / Restrictions Precautions Precautions: Posterior Hip Precaution Booklet Issued: No Required Braces or Orthoses: Knee Immobilizer - Left Restrictions Weight Bearing Restrictions: Yes LLE Weight Bearing: Weight bearing as tolerated Other Position/Activity Restrictions: per secure chat: Per Allena Katz: "The immobilizer does the same thing as a hip abduction pillow, and I found that patient's usually toleratethe knee immobilizer better. If he would be happier with the hip abduction pillow in between his legs, that would be fine as well.  He should have it on as much as possible"      Mobility  Bed Mobility Overal bed mobility: Needs Assistance Bed Mobility: Supine to Sit, Sit to Supine     Supine to sit: Mod assist, +2 for physical assistance Sit to supine: Mod assist, +2 for physical assistance        Transfers Overall transfer level: Needs assistance Equipment used: Rolling walker (2 wheels) Transfers: Sit to/from Stand Sit to Stand: Min assist, Mod assist, +2 physical assistance           General transfer comment: posterior lean noted, min-modAx2 to maintain standing, stabilization of RW needed    Ambulation/Gait Ambulation/Gait assistance: Min assist, Mod assist, +2 physical assistance Gait Distance (Feet): 3 Feet Assistive device: Rolling walker (2 wheels)         General Gait Details: able to laterally step at EOB and one step forwards/backwards but not truly able to ambulate today  Stairs  Wheelchair Mobility     Tilt Bed    Modified Rankin (Stroke Patients Only)       Balance Overall balance assessment: Needs assistance Sitting-balance support: Bilateral upper extremity supported Sitting balance-Leahy Scale: Poor Sitting balance - Comments: CGa-minA for safetly   Standing balance support: Bilateral upper  extremity supported Standing balance-Leahy Scale: Poor                               Pertinent Vitals/Pain Pain Assessment Pain Assessment: Faces Faces Pain Scale: Hurts a little bit Pain Location: endorsed LLE pain with mobility/weight bearing but did not quantify Pain Descriptors / Indicators: Grimacing, Guarding Pain Intervention(s): Limited activity within patient's tolerance, Monitored during session, Repositioned    Home Living Family/patient expects to be discharged to:: Private residence Living Arrangements: Alone   Type of Home: Independent living facility             Additional Comments: Independent at [facility]    Prior Function Prior Level of Function : Independent/Modified Independent             Mobility Comments: rollator when going to the store and night time to bathroom ADLs Comments: aid every am 8-2 - drive him , do laundry and things around the house     Extremity/Trunk Assessment   Upper Extremity Assessment Upper Extremity Assessment: Defer to OT evaluation    Lower Extremity Assessment Lower Extremity Assessment: Generalized weakness (very rigid, weak)       Communication   Communication Communication:  (soft spoken from parkinsons)  Cognition Arousal: Alert   Overall Cognitive Status: Within Functional Limits for tasks assessed                                          General Comments      Exercises     Assessment/Plan    PT Assessment Patient needs continued PT services  PT Problem List Decreased strength;Pain;Decreased range of motion;Decreased activity tolerance;Decreased balance;Decreased mobility;Decreased knowledge of precautions;Decreased knowledge of use of DME       PT Treatment Interventions DME instruction;Neuromuscular re-education;Gait training;Stair training;Patient/family education;Functional mobility training;Therapeutic activities;Therapeutic exercise;Balance training     PT Goals (Current goals can be found in the Care Plan section)  Acute Rehab PT Goals Patient Stated Goal: to return to PLOF PT Goal Formulation: With patient/family Time For Goal Achievement: 06/18/23 Potential to Achieve Goals: Fair    Frequency BID     Co-evaluation PT/OT/SLP Co-Evaluation/Treatment: Yes Reason for Co-Treatment: Complexity of the patient's impairments (multi-system involvement) PT goals addressed during session: Mobility/safety with mobility;Balance;Proper use of DME OT goals addressed during session: ADL's and self-care;Proper use of Adaptive equipment and DME;Strengthening/ROM       AM-PAC PT "6 Clicks" Mobility  Outcome Measure Help needed turning from your back to your side while in a flat bed without using bedrails?: A Lot Help needed moving from lying on your back to sitting on the side of a flat bed without using bedrails?: A Lot Help needed moving to and from a bed to a chair (including a wheelchair)?: A Lot Help needed standing up from a chair using your arms (e.g., wheelchair or bedside chair)?: A Lot Help needed to walk in hospital room?: A Lot Help needed climbing 3-5 steps with a railing? : Total 6 Click Score: 11  End of Session Equipment Utilized During Treatment: Gait belt Activity Tolerance: Patient tolerated treatment well Patient left: in bed;with call bell/phone within reach;with bed alarm set;with nursing/sitter in room;with family/visitor present Nurse Communication: Mobility status PT Visit Diagnosis: Other abnormalities of gait and mobility (R26.89);Muscle weakness (generalized) (M62.81);Difficulty in walking, not elsewhere classified (R26.2);Pain Pain - Right/Left: Left Pain - part of body: Hip    Time: 0813-0852 PT Time Calculation (min) (ACUTE ONLY): 39 min   Charges:   PT Evaluation $PT Eval Low Complexity: 1 Low PT Treatments $Therapeutic Activity: 23-37 mins PT General Charges $$ ACUTE PT VISIT: 1 Visit          Olga Coaster PT, DPT 11:27 AM,06/04/23

## 2023-06-04 NOTE — Plan of Care (Signed)
  Problem: Clinical Measurements: Goal: Ability to maintain clinical measurements within normal limits will improve Outcome: Progressing   Problem: Activity: Goal: Risk for activity intolerance will decrease Outcome: Progressing   Problem: Elimination: Goal: Will not experience complications related to bowel motility Outcome: Progressing   Problem: Elimination: Goal: Will not experience complications related to urinary retention Outcome: Progressing   Problem: Pain Managment: Goal: General experience of comfort will improve Outcome: Progressing   Problem: Skin Integrity: Goal: Risk for impaired skin integrity will decrease Outcome: Progressing   Problem: Pain Management: Goal: Pain level will decrease with appropriate interventions Outcome: Progressing   Problem: Clinical Measurements: Goal: Postoperative complications will be avoided or minimized Outcome: Progressing

## 2023-06-04 NOTE — Progress Notes (Signed)
Occupational Therapy Treatment Patient Details Name: Terry Collins MRN: [id number] DOB: [birth year] Today's Date: 06/04/2023   History of present illness Terry Collins is a 87 y.o. male who sustained a displaced femoral neck fracture after a fall. He under went left hip bipolar hemiarthroplasty the left  hip on 06/02/2023. Also noted for afib with RVR. PMH of CHF, Parkinsons, R TKA.   OT comments  Pt seen as PT/OT co-tx to maximize function and safety. Received pt with PT at bedside. Pt stiff and rigid, has not had Parkinson's meds yet this AM. Daughter in room for tx session, assists with PLOF. Pt currently requiring MODA x 2 for bed mobility, STS MOD A x 2 with stabilization of RW. Posterior lean noted sitting EOB and in standing. Tolerates sitting EOB with occasional minA to correct posterior lean, washes face with setup. RN in room to assess LLE due to noted swelling. Pt left with needs in reach, daughter present and bed alarm on. Pt making progress towards OT goals. Will continue to benefit from skilled OT intervention to address deficits listed in OT problem list. Anticipate the need for follow up OT services upon acute hospital DC.       If plan is discharge home, recommend the following:  Two people to help with walking and/or transfers;A lot of help with bathing/dressing/bathroom;Assistance with cooking/housework;Assistance with feeding;Direct supervision/assist for medications management;Direct supervision/assist for financial management;Assist for transportation;Help with stairs or ramp for entrance   Equipment Recommendations  Other (comment) (defer)    Recommendations for Other Services Other (comment)    Precautions / Restrictions Precautions Precautions: Posterior Hip Precaution Booklet Issued: No Required Braces or Orthoses: Knee Immobilizer - Left Knee Immobilizer - Left: Other (comment) ("on as much as possible" per Dr. Allena Katz) Restrictions Weight Bearing Restrictions:  Yes LUE Weight Bearing: Weight bearing as tolerated LLE Weight Bearing: Weight bearing as tolerated Other Position/Activity Restrictions: per secure chat: Per Allena Katz: "The immobilizer does the same thing as a hip abduction pillow, and I found that patient's usually toleratethe knee immobilizer better. If he would be happier with the hip abduction pillow in between his legs, that would be fine as well.  He should have it on as much as possible"       Mobility Bed Mobility Overal bed mobility: Needs Assistance Bed Mobility: Supine to Sit, Sit to Supine     Supine to sit: Mod assist, +2 for physical assistance Sit to supine: Mod assist, +2 for physical assistance   General bed mobility comments: Mod A for supine to sit -leaning posteriorly - min A for LE    Transfers Overall transfer level: Needs assistance Equipment used: Rolling walker (2 wheels) Transfers: Sit to/from Stand Sit to Stand: Min assist, Mod assist, +2 physical assistance           General transfer comment: posterior lean noted, min-modAx2 to maintain standing, stabilization of RW needed     Balance Overall balance assessment: Needs assistance Sitting-balance support: Bilateral upper extremity supported Sitting balance-Leahy Scale: Poor Sitting balance - Comments: CGa-minA for safetly   Standing balance support: Bilateral upper extremity supported Standing balance-Leahy Scale: Poor                             ADL either performed or assessed with clinical judgement   ADL Overall ADL's : Needs assistance/impaired     Grooming: Wash/dry face;Sitting;Set up  Functional mobility during ADLs: +2 for physical assistance;+2 for safety/equipment;Rolling walker (2 wheels);Cueing for sequencing;Cueing for safety General ADL Comments: Pt's daughter feeding him breakfast end of session. Pt had not had PD meds, stiff in extremeties. Able to wash face sitting EOB  with setup.    Extremity/Trunk Assessment Upper Extremity Assessment Upper Extremity Assessment: Defer to OT evaluation   Lower Extremity Assessment Lower Extremity Assessment: Generalized weakness (very rigid, weak)         Cognition Arousal: Alert Behavior During Therapy: WFL for tasks assessed/performed Overall Cognitive Status: Within Functional Limits for tasks assessed                                                     Pertinent Vitals/ Pain       Pain Assessment Pain Assessment: Faces Faces Pain Scale: Hurts a little bit Pain Location: endorsed LLE pain with mobility/weight bearing but did not quantify Pain Descriptors / Indicators: Grimacing, Guarding Pain Intervention(s): Limited activity within patient's tolerance  Home Living Family/patient expects to be discharged to:: Private residence Living Arrangements: Alone   Type of Home: Independent living facility                           Additional Comments: Independent at [facility]          Frequency  Min 1X/week        Progress Toward Goals  OT Goals(current goals can now be found in the care plan section)  Progress towards OT goals: Progressing toward goals  Acute Rehab OT Goals OT Goal Formulation: With patient/family Time For Goal Achievement: 06/17/23 Potential to Achieve Goals: Good  Plan      Co-evaluation      Reason for Co-Treatment: Complexity of the patient's impairments (multi-system involvement) PT goals addressed during session: Mobility/safety with mobility;Balance;Proper use of DME OT goals addressed during session: ADL's and self-care;Proper use of Adaptive equipment and DME;Strengthening/ROM      AM-PAC OT "6 Clicks" Daily Activity     Outcome Measure   Help from another person eating meals?: A Little Help from another person taking care of personal grooming?: A Little Help from another person toileting, which includes using  toliet, bedpan, or urinal?: Total Help from another person bathing (including washing, rinsing, drying)?: A Lot Help from another person to put on and taking off regular upper body clothing?: A Little Help from another person to put on and taking off regular lower body clothing?: A Lot 6 Click Score: 14    End of Session Equipment Utilized During Treatment: Left knee immobilizer;Gait belt;Rolling walker (2 wheels)  OT Visit Diagnosis: Unsteadiness on feet (R26.81);Muscle weakness (generalized) (M62.81);History of falling (Z91.81);Repeated falls (R29.6)   Activity Tolerance Patient tolerated treatment well   Patient Left in bed;with call bell/phone within reach;with bed alarm set;with nursing/sitter in room;with family/visitor present   Nurse Communication Mobility status        Time: 8413-2440 OT Time Calculation (min): 33 min  Charges: OT General Charges $OT Visit: 1 Visit OT Treatments $Self Care/Home Management : 8-22 mins  Jia Dottavio L. Blondine Hottel, OTR/L  06/04/23, 12:54 PM

## 2023-06-05 ENCOUNTER — Encounter: Payer: Self-pay | Admitting: Orthopedic Surgery

## 2023-06-05 DIAGNOSIS — S72002A Fracture of unspecified part of neck of left femur, initial encounter for closed fracture: Secondary | ICD-10-CM | POA: Diagnosis not present

## 2023-06-05 LAB — BASIC METABOLIC PANEL WITH GFR
Anion gap: 8 (ref 5–15)
BUN: 37 mg/dL — ABNORMAL HIGH (ref 8–23)
CO2: 29 mmol/L (ref 22–32)
Calcium: 8.9 mg/dL (ref 8.9–10.3)
Chloride: 105 mmol/L (ref 98–111)
Creatinine, Ser: 0.86 mg/dL (ref 0.61–1.24)
GFR, Estimated: >60 mL/min
Glucose, Bld: 111 mg/dL — ABNORMAL HIGH (ref 70–99)
Potassium: 3.4 mmol/L — ABNORMAL LOW (ref 3.5–5.1)
Sodium: 142 mmol/L (ref 135–145)

## 2023-06-05 LAB — CBC
HCT: 33.8 % — ABNORMAL LOW (ref 39.0–52.0)
Hemoglobin: 11.6 g/dL — ABNORMAL LOW (ref 13.0–17.0)
MCH: 34.3 pg — ABNORMAL HIGH (ref 26.0–34.0)
MCHC: 34.3 g/dL (ref 30.0–36.0)
MCV: 100 fL (ref 80.0–100.0)
Platelets: 137 10*3/uL — ABNORMAL LOW (ref 150–400)
RBC: 3.38 MIL/uL — ABNORMAL LOW (ref 4.22–5.81)
RDW: 14.1 % (ref 11.5–15.5)
WBC: 9 10*3/uL (ref 4.0–10.5)
nRBC: 0 % (ref 0.0–0.2)

## 2023-06-05 NOTE — NC FL2 (Signed)
[place] MEDICAID FL2 LEVEL OF CARE FORM     IDENTIFICATION  Patient Name: Terry Collins Birthdate: July 19, 1936 Sex: male Admission Date (Current Location): 06/01/2023  [facility] and [place] Number:  Chiropodist and Address:  [facility], [address] [place], [place] [address]      Provider Number: [id number]  Attending Physician Name and Address:  Charise Killian, MD  Relative Name and Phone Number:  daughter [phone]    Current Level of Care: Hospital Recommended Level of Care: Skilled Nursing Facility Prior Approval Number:    Date Approved/Denied:   PASRR Number: [id number]  Discharge Plan: SNF    Current Diagnoses: Patient Active Problem List   Diagnosis Date Noted   Closed left hip fracture (HCC) 06/01/2023   Atrial fibrillation with RVR (HCC) 06/01/2023   Parkinson disease (HCC) 06/01/2023   Hip fracture (HCC) 06/01/2023   Incarcerated inguinal hernia, unilateral 12/15/2020   Paroxysmal atrial fibrillation (HCC)    Leukocytosis    Acute metabolic encephalopathy    Normocytic anemia    S/P TKR (total knee replacement) using cement, right    Total knee replacement status, right 07/16/2020    Orientation RESPIRATION BLADDER Height & Weight     Self, Place  Normal Continent Weight: 73 kg Height:  6\' 2"  (188 cm)  BEHAVIORAL SYMPTOMS/MOOD NEUROLOGICAL BOWEL NUTRITION STATUS      Continent Diet (See dc summary)  AMBULATORY STATUS COMMUNICATION OF NEEDS Skin   Extensive Assist Verbally Normal, Surgical wounds                       Personal Care Assistance Level of Assistance  Bathing, Feeding Bathing Assistance: Limited assistance Feeding assistance: Limited assistance       Functional Limitations Info  Sight, Hearing, Speech Sight Info: Adequate Hearing Info: Adequate Speech Info: Adequate    SPECIAL CARE FACTORS FREQUENCY  PT (By licensed PT), OT (By licensed OT)     PT Frequency: 5 times per  week OT Frequency: 5 times per week            Contractures Contractures Info: Not present    Additional Factors Info  Code Status, Allergies Code Status Info: Full code Allergies Info: Lactose Intolerance (Gi), Tramadol           Current Medications (06/05/2023):  This is the current hospital active medication list Current Facility-Administered Medications  Medication Dose Route Frequency Provider Last Rate Last Admin   0.9 %  sodium chloride infusion   Intravenous Continuous Signa Kell, MD 75 mL/hr at 06/03/23 0525 New Bag at 06/03/23 0525   acetaminophen (TYLENOL) tablet 1,000 mg  1,000 mg Oral Q8H Signa Kell, MD   1,000 mg at 06/05/23 3329   bisacodyl (DULCOLAX) suppository 10 mg  10 mg Rectal Daily PRN Signa Kell, MD       carbidopa-levodopa (SINEMET IR) 25-100 MG per tablet immediate release 1 tablet  1 tablet Oral QID Signa Kell, MD   1 tablet at 06/05/23 0933   carbidopa-levodopa (SINEMET IR) 25-100 MG per tablet immediate release 2 tablet  2 tablet Oral Q0600 Signa Kell, MD   2 tablet at 06/05/23 5188   cycloSPORINE (RESTASIS) 0.05 % ophthalmic emulsion 1 drop  1 drop Both Eyes BID Signa Kell, MD   1 drop at 06/05/23 0933   dabigatran (PRADAXA) capsule 150 mg  150 mg Oral BID Signa Kell, MD   150 mg at 06/05/23 0933   docusate  sodium (COLACE) capsule 100 mg  100 mg Oral BID Signa Kell, MD   100 mg at 06/05/23 0933   feeding supplement (ENSURE ENLIVE / ENSURE PLUS) liquid 237 mL  237 mL Oral TID BM Charise Killian, MD   237 mL at 06/05/23 0933   furosemide (LASIX) tablet 40 mg  40 mg Oral Daily Signa Kell, MD   40 mg at 06/05/23 0933   gabapentin (NEURONTIN) capsule 100 mg  100 mg Oral BID Signa Kell, MD   100 mg at 06/05/23 0933   HYDROmorphone (DILAUDID) injection 0.2-0.4 mg  0.2-0.4 mg Intravenous Q4H PRN Signa Kell, MD       melatonin tablet 5 mg  5 mg Oral QHS Charise Killian, MD   5 mg at 06/04/23 2216   menthol-cetylpyridinium (CEPACOL)  lozenge 3 mg  1 lozenge Oral PRN Signa Kell, MD       Or   phenol (CHLORASEPTIC) mouth spray 1 spray  1 spray Mouth/Throat PRN Signa Kell, MD       metoCLOPramide (REGLAN) tablet 5-10 mg  5-10 mg Oral Q8H PRN Signa Kell, MD       Or   metoCLOPramide (REGLAN) injection 5-10 mg  5-10 mg Intravenous Q8H PRN Signa Kell, MD       metoprolol tartrate (LOPRESSOR) injection 5 mg  5 mg Intravenous Q4H PRN Signa Kell, MD       metoprolol tartrate (LOPRESSOR) tablet 50 mg  50 mg Oral BID Signa Kell, MD   50 mg at 06/05/23 1324   multivitamin with minerals tablet 1 tablet  1 tablet Oral Daily Charise Killian, MD   1 tablet at 06/05/23 4010   neomycin-bacitracin-polymyxin (NEOSPORIN) ointment   Topical BID Signa Kell, MD   Given at 06/05/23 0934   ondansetron (ZOFRAN) tablet 4 mg  4 mg Oral Q6H PRN Signa Kell, MD       Or   ondansetron [facility]) injection 4 mg  4 mg Intravenous Q6H PRN Signa Kell, MD       oxyCODONE (Oxy IR/ROXICODONE) immediate release tablet 2.5-5 mg  2.5-5 mg Oral Q4H PRN Signa Kell, MD       oxyCODONE (Oxy IR/ROXICODONE) immediate release tablet 5-10 mg  5-10 mg Oral Q4H PRN Signa Kell, MD       senna Mancel Parsons) tablet 8.6 mg  1 tablet Oral BID Signa Kell, MD   8.6 mg at 06/05/23 0933   senna-docusate (Senokot-S) tablet 1 tablet  1 tablet Oral QHS PRN Signa Kell, MD       sodium phosphate (FLEET) enema 1 enema  1 enema Rectal Once PRN Signa Kell, MD         Discharge Medications: Please see discharge summary for a list of discharge medications.  Relevant Imaging Results:  Relevant Lab Results:   Additional Information SS# [id number]  Marlowe Sax, RN

## 2023-06-05 NOTE — Progress Notes (Signed)
Report called to Tara, RN

## 2023-06-05 NOTE — Care Management Important Message (Signed)
Important Message  Patient Details  Name: Terry Collins MRN: [id number] Date of Birth: 01-25-1936   Important Message Given:  Yes - Medicare IM     Festus Pursel, Stephan Minister 06/05/2023, 3:28 PM

## 2023-06-05 NOTE — TOC Transition Note (Addendum)
Transition of Care [facility]) - CM/SW Discharge Note   Patient Details  Name: Dayvion Sans MRN: [id number] Date of Birth: 12/03/1935  [facility]) CM/SW Contact:  Marlowe Sax, RN Phone Number: 06/05/2023, 1:36 PM   Clinical Narrative:        Called EMS to transport patient to room 354 at [facility]  There are three ahead of him, Family is in there oom and aware     Patient Goals and CMS Choice      Discharge Placement                         Discharge Plan and Services Additional resources added to the After Visit Summary for                                       Social Determinants of Health (SDOH) Interventions SDOH Screenings   Food Insecurity: No Food Insecurity (06/02/2023)  Housing: Low Risk  (06/02/2023)  Transportation Needs: No Transportation Needs (06/02/2023)  Utilities: Not At Risk (06/02/2023)  Financial Resource Strain: Low Risk  (05/22/2023)   Received from [facility]  Physical Activity: Insufficiently Active (12/21/2020)   Received from [facility]  Social Connections: Unknown (12/21/2020)   Received from [facility]  Stress: No Stress Concern Present (12/21/2020)   Received from [facility]  Tobacco Use: Low Risk  (06/02/2023)     Readmission Risk Interventions     No data to display

## 2023-06-05 NOTE — Progress Notes (Signed)
Physical Therapy Treatment Patient Details Name: Terry Collins MRN: [id number] DOB: 20-Nov-1935 Today's Date: 06/05/2023   History of Present Illness Terry Collins is a 87 y.o. male who sustained a displaced femoral neck fracture after a fall. He under went left hip bipolar hemiarthroplasty the left  hip on 06/02/2023. Also noted for afib with RVR. PMH of CHF, Parkinsons, R TKA.    PT Comments  Pt seen for PT tx with pt agreeable to tx, daughter & caregiver present for session. Pt unable to recall hip precautions at beginning of session but recalled 2/3 at end of session with PT continuing to educate him on precautions. Dr. Allena Katz cleared pt to remove KI for OOB mobility (via secure chat) & PT educated pt & visitors on this. Pt is able to complete bed mobility with mod assist, STS with min assist & ambulate in room towards door with RW & min assist. Pt with impaired gait pattern as noted below, L foot turning inwards at rest & during gait. Pt anticipating d/c to SNF rehab today.   If plan is discharge home, recommend the following: A little help with walking and/or transfers;A little help with bathing/dressing/bathroom;Assistance with cooking/housework;Assist for transportation;Help with stairs or ramp for entrance   Can travel by private vehicle     No  Equipment Recommendations  Other (comment) (defer to next venue)    Recommendations for Other Services       Precautions / Restrictions Precautions Precautions: Fall;Posterior Hip Required Braces or Orthoses: Knee Immobilizer - Left (in place of abduction wedge) Knee Immobilizer - Left: Other (comment) (KI being used in place of hip abduction wedge, Dr. Allena Katz okay with pt removing it for OOB mobility (per secure chat)) Restrictions Weight Bearing Restrictions: Yes LLE Weight Bearing: Weight bearing as tolerated     Mobility  Bed Mobility Overal bed mobility: Needs Assistance Bed Mobility: Supine to Sit     Supine to sit: Mod assist,  Used rails, HOB elevated (assistance/cuing to move BLE to EOB, use bed rails, shift hips to edge of bed)          Transfers Overall transfer level: Needs assistance Equipment used: Rolling walker (2 wheels) Transfers: Sit to/from Stand Sit to Stand: Min assist, From elevated surface           General transfer comment: STS from elevated EOB with cuing re: hand placement    Ambulation/Gait Ambulation/Gait assistance: Min assist Gait Distance (Feet): 25 Feet Assistive device: Rolling walker (2 wheels) Gait Pattern/deviations: Decreased step length - right, Decreased step length - left, Decreased dorsiflexion - right, Decreased dorsiflexion - left, Decreased stride length Gait velocity: decreased     General Gait Details: Pt with L foot internally rotated throughout gait, caregiver reports this would occasionally occur prior to admission but is constant now. Cuing for increased foot clearance LLE.   Stairs             Wheelchair Mobility     Tilt Bed    Modified Rankin (Stroke Patients Only)       Balance Overall balance assessment: Needs assistance Sitting-balance support: Bilateral upper extremity supported Sitting balance-Leahy Scale: Fair Sitting balance - Comments: supervision static sitting EOB   Standing balance support: Reliant on assistive device for balance, Bilateral upper extremity supported, During functional activity Standing balance-Leahy Scale: Fair                              Cognition  Arousal: Alert Behavior During Therapy: WFL for tasks assessed/performed Overall Cognitive Status: Within Functional Limits for tasks assessed                                          Exercises Total Joint Exercises Ankle Circles/Pumps: AROM, Supine, Left, 5 reps Short Arc Quad: AROM, Strengthening, Left, 10 reps, Supine Heel Slides: AAROM, Supine, Strengthening, Left, 10 reps Straight Leg Raises: Supine, AAROM,  Strengthening, Left, 10 reps Long Arc Quad: AROM, Seated, Strengthening, Left, 10 reps    General Comments        Pertinent Vitals/Pain Pain Assessment Pain Assessment: No/denies pain    Home Living                          Prior Function            PT Goals (current goals can now be found in the care plan section) Acute Rehab PT Goals Patient Stated Goal: to return to PLOF PT Goal Formulation: With patient/family Time For Goal Achievement: 06/18/23 Potential to Achieve Goals: Fair Progress towards PT goals: Progressing toward goals    Frequency    BID      PT Plan      Co-evaluation              AM-PAC PT "6 Clicks" Mobility   Outcome Measure  Help needed turning from your back to your side while in a flat bed without using bedrails?: A Little Help needed moving from lying on your back to sitting on the side of a flat bed without using bedrails?: A Lot Help needed moving to and from a bed to a chair (including a wheelchair)?: A Little Help needed standing up from a chair using your arms (e.g., wheelchair or bedside chair)?: A Little Help needed to walk in hospital room?: A Little Help needed climbing 3-5 steps with a railing? : A Lot 6 Click Score: 16    End of Session Equipment Utilized During Treatment: Gait belt Activity Tolerance: Patient tolerated treatment well Patient left: with chair alarm set;in chair;with call bell/phone within reach;with family/visitor present Nurse Communication: Mobility status PT Visit Diagnosis: Other abnormalities of gait and mobility (R26.89);Muscle weakness (generalized) (M62.81);Difficulty in walking, not elsewhere classified (R26.2)     Time: 0272-5366 PT Time Calculation (min) (ACUTE ONLY): 32 min  Charges:    $Therapeutic Exercise: 8-22 mins $Therapeutic Activity: 8-22 mins PT General Charges $$ ACUTE PT VISIT: 1 Visit                     Aleda Grana, PT, DPT 06/05/23, 11:54  AM   Sandi Mariscal 06/05/2023, 11:52 AM

## 2023-06-05 NOTE — Progress Notes (Signed)
  Subjective: 3 Days Post-Op Procedure(s) (LRB): ARTHROPLASTY BIPOLAR HIP (HEMIARTHROPLASTY) (Left) Patient reports pain as mild.  Good communication.  Able to answer questions appropriately. Patient is well, and has had no acute complaints or problems Plan is to go Rehab after hospital stay. Negative for chest pain and shortness of breath Fever: no Gastrointestinal: Negative for nausea and vomiting  Objective: Vital signs in last 24 hours: Temp:  [98.2 F (36.8 C)-98.5 F (36.9 C)] 98.2 F (36.8 C) (10/06 2156) Pulse Rate:  [77-87] 82 (10/06 2156) Resp:  [14-18] 18 (10/06 2156) BP: (136-149)/(76-94) 149/94 (10/06 2156) SpO2:  [96 %-100 %] 96 % (10/06 2156) Weight:  [73 kg] 73 kg (10/07 0400)  Intake/Output from previous day:  Intake/Output Summary (Last 24 hours) at 06/05/2023 0720 Last data filed at 06/04/2023 1738 Gross per 24 hour  Intake 20 ml  Output 1100 ml  Net -1080 ml    Intake/Output this shift: No intake/output data recorded.  Labs: Recent Labs    06/03/23 0429 06/04/23 0437 06/05/23 0341  HGB 11.5* 11.9* 11.6*   Recent Labs    06/04/23 0437 06/05/23 0341  WBC 11.4* 9.0  RBC 3.54* 3.38*  HCT 35.6* 33.8*  PLT 144* 137*   Recent Labs    06/04/23 0437 06/05/23 0341  NA 140 142  K 3.8 3.4*  CL 104 105  CO2 26 29  BUN 44* 37*  CREATININE 1.13 0.86  GLUCOSE 128* 111*  CALCIUM 8.8* 8.9   No results for input(s): "LABPT", "INR" in the last 72 hours.    EXAM General - Patient is Alert and Oriented Extremity - Neurovascular intact Sensation intact distally Dorsiflexion/Plantar flexion intact Compartment soft Dressing/Incision - clean, dry, scant blood tinged drainage Motor Function - intact, moving foot and toes well on exam.  Ambulated 3 feet to the chair with physical therapy and Occupational Therapy  Past Medical History:  Diagnosis Date   Anemia    Aortic valve insufficiency    Arthritis    hands   Atherosclerosis of aorta (HCC)     Carotid stenosis    History of kidney stones 2010   Mixed hyperlipidemia    Paroxysmal atrial fibrillation (HCC)    Venous insufficiency of both lower extremities     Assessment/Plan: 3 Days Post-Op Procedure(s) (LRB): ARTHROPLASTY BIPOLAR HIP (HEMIARTHROPLASTY) (Left) Principal Problem:   Hip fracture (HCC) Active Problems:   Closed left hip fracture (HCC)   Atrial fibrillation with RVR (HCC)   Parkinson disease (HCC)  Estimated body mass index is 20.66 kg/m as calculated from the following:   Height as of this encounter: 6\' 2"  (1.88 m).   Weight as of this encounter: 73 kg. Advance diet Up with therapy  DVT Prophylaxis - TED hose and Pradaxa Weight-Bearing as tolerated to left leg  Discharge planning: Follow-up at [facility] clinic in 2 weeks for staple removal and x-rays of the left hip.  No showering.  Dedra Skeens, PA-C Orthopaedic Surgery 06/05/2023, 7:20 AM

## 2023-06-05 NOTE — Anesthesia Postprocedure Evaluation (Signed)
Anesthesia Post Note  Patient: Terry Collins  Procedure(s) Performed: ARTHROPLASTY BIPOLAR HIP (HEMIARTHROPLASTY) (Left: Hip)  Patient location during evaluation: PACU Anesthesia Type: General Level of consciousness: awake and alert Pain management: pain level controlled Vital Signs Assessment: post-procedure vital signs reviewed and stable Respiratory status: spontaneous breathing, nonlabored ventilation, respiratory function stable and patient connected to nasal cannula oxygen Cardiovascular status: blood pressure returned to baseline and stable Postop Assessment: no apparent nausea or vomiting Anesthetic complications: no   No notable events documented.   Last Vitals:  Vitals:   06/04/23 1531 06/04/23 2156  BP: (!) 141/93 (!) 149/94  Pulse: 77 82  Resp: 16 18  Temp: 36.9 C 36.8 C  SpO2: 100% 96%    Last Pain:  Vitals:   06/04/23 2156  TempSrc: Oral  PainSc: 0-No pain                 Cleda Mccreedy Mikell Camp

## 2023-06-05 NOTE — Discharge Summary (Addendum)
Physician Discharge Summary  Terry Collins AVW:[id number] DOB: 06-17-1936 DOA: 06/01/2023  PCP: Terry Reichmann, MD  Admit date: 06/01/2023 Discharge date: 06/05/2023  Admitted From: home  Disposition:  SNF  Recommendations for Outpatient Follow-up:  Follow up with PCP in 1-2 weeks F/u w/ ortho surg, Dr. Signa Collins or PA Terry Collins, in 2 weeks  Home Health: no  Equipment/Devices:  Discharge Condition: stable CODE STATUS: full  Diet recommendation: Heart Healthy   Brief/Interim Summary: HPI was taken from Dr. Irving Burton:  Terry Collins is a 87 y.o. male with medical history significant of PAF on Pradaxa, Parkinson's disease, moderate aortic valve regurgitation, HLD, chronic ambulation impairment, presented with fall and left hip pain.   Patient has Parkinson's disease and at baseline uses roller walker to ambulate, according to caregiver patient usually is steady.  As per caregiver, patient has excellent exercise tolerance he goes to gym 3 times a week and able to use the treadmill for 15+ minutes each occasions and never complained about chest pain or shortness of breath.  Chronically patient has history of PAF, for which he underwent ablation in 2019 and he used to be on Tikosyn which was discontinued sometime this year by cardiology.  And patient continue to take Pradaxa for anticoagulation and denies any bleeding.  This morning, patient fell while turning to kitchen countertop to reach coffee cup and lost his balance and fell on the left hip, he denied any prodrome of lightheadedness chest pain shortness of breath blurry vision or palpitations and he did not lose consciousness.  Facility staff were able to hold him up and put him in the bed however patient continued to complain left hip pain and caregiver decided to call EMS.   ED Course: Afebrile, heart rate in the range of 90s-130s in rapid A-fib, blood pressure elevated nonhypoxic.  Hip x-ray showed mild displaced subcapital left  femoral neck fracture blood work showed K3.7, bicarb 32, creatinine 0.9, hemoglobin 14, WBC 7.9.      Discharge Diagnoses:  Principal Problem:   Hip fracture (HCC) Active Problems:   Closed left hip fracture (HCC)   Atrial fibrillation with RVR (HCC)   Parkinson disease (HCC)  Left femoral neck fracture: s/p left hip hemiarthroplasty. Continue on pradaxa. Oxy, dilaudid prn for pain    A-fib: with RVR. Persistent  a.fib. Continue on pradaxa. Echo shows EF 50-55%, diastolic function is indeterminate, no regional wall motion abnormalities, mod-severe AR, no atrial level shunts detected   Aortic stenosis: continue w/ supportive care   Chronic diastolic CHF: appears euvolemic. Restart home dose of lasix. Monitor I/Os    Parkinson's disease: continue on home dose of sinemet   Thrombocytopenia: etiology unclear. Labile   Hypokalemia: WNL today   AKI: resolved  Discharge Instructions  Discharge Instructions     Diet - low sodium heart healthy   Complete by: As directed    Discharge instructions   Complete by: As directed    F/u w/ PCP in 1-2 weeks. F/u w/ ortho surg, Dr. Signa Collins, in 2 weeks. No showering as per ortho surg   Increase activity slowly   Complete by: As directed       Allergies as of 06/05/2023       Reactions   Lactose Intolerance (gi) Diarrhea   GI- upset   Tramadol Other (See Comments)   Confusion-hallucinations        Medication List     TAKE these medications    carbidopa-levodopa 25-100 MG tablet Commonly  frontal view of the bilateral hips during surgery. The patient is undergoing total left hip arthroplasty with the femoral stem in place. There is expected left hip intra-articular air. Diffuse decreased bone mineralization. Moderate superomedial right femoroacetabular joint space narrowing. Surgical clips overlie the prostate. IMPRESSION: Intraoperative radiograph demonstrating left femoral stem placement for total left hip arthroplasty. Electronically Signed   By: Terry Garnet M.D.   On: 06/02/2023 16:33   ECHOCARDIOGRAM COMPLETE  Result Date: 06/01/2023    ECHOCARDIOGRAM REPORT   Patient Name:   Terry Collins Date of Exam: 06/01/2023 Medical Rec #:  [id number]     Height:       74.0 in Accession #:    [id number]    Weight:       165.0 lb Date of Birth:  [birth year]     BSA:          2.003 m Patient Age:    87 years      BP:           149/91 mmHg  Patient Gender: M             HR:           84 bpm. Exam Location:  [facility] Procedure: 2D Echo, Cardiac Doppler and Color Doppler Indications:     Aortic regurgitation I35.1  History:         Patient has no prior history of Echocardiogram examinations.                  Paroxysmal Afib, Aortic valve insufficiency.  Sonographer:     Terry Collins Referring Phys:  [id number] Terry Collins Diagnosing Phys: Terry Collins IMPRESSIONS  1. Left ventricular ejection fraction, by estimation, is 50 to 55% with beat to beat variability. The left ventricle has normal function. The left ventricle has no regional wall motion abnormalities. There is moderate concentric left ventricular hypertrophy. Left ventricular diastolic parameters are indeterminate.  2. Right ventricular systolic function was not well visualized.  3. Right atrial size was mildly dilated.  4. The mitral valve is normal in structure. Trivial mitral valve regurgitation.  5. The aortic valve is tricuspid. There is mild thickening of the aortic valve. Aortic valve regurgitation is moderate to severe.  6. The inferior vena cava is dilated in size with <50% respiratory variability, suggesting right atrial pressure of 15 mmHg. FINDINGS  Left Ventricle: Left ventricular ejection fraction, by estimation, is 50 to 55%. The left ventricle has normal function. The left ventricle has no regional wall motion abnormalities. The left ventricular internal cavity size was normal in size. There is  moderate concentric left ventricular hypertrophy. Left ventricular diastolic parameters are indeterminate. Right Ventricle: Right vetricular wall thickness was not well visualized. Right ventricular systolic function was not well visualized. Left Atrium: Left atrial size was not well visualized. Right Atrium: Right atrial size was mildly dilated. Pericardium: There is no evidence of pericardial effusion. Mitral Valve: The mitral valve is normal in structure. There is mild thickening of the  mitral valve leaflet(s). Trivial mitral valve regurgitation. Tricuspid Valve: The tricuspid valve is normal in structure. Tricuspid valve regurgitation is mild. Aortic Valve: The aortic valve is tricuspid. There is mild thickening of the aortic valve. Aortic valve regurgitation is moderate to severe. Aortic valve mean gradient measures 3.0 mmHg. Aortic valve peak gradient measures 6.0 mmHg. Aortic valve area, by  VTI measures 4.21 cm. Pulmonic Valve: The pulmonic valve was normal in structure. Pulmonic valve regurgitation is mild. Aorta: The  frontal view of the bilateral hips during surgery. The patient is undergoing total left hip arthroplasty with the femoral stem in place. There is expected left hip intra-articular air. Diffuse decreased bone mineralization. Moderate superomedial right femoroacetabular joint space narrowing. Surgical clips overlie the prostate. IMPRESSION: Intraoperative radiograph demonstrating left femoral stem placement for total left hip arthroplasty. Electronically Signed   By: Terry Garnet M.D.   On: 06/02/2023 16:33   ECHOCARDIOGRAM COMPLETE  Result Date: 06/01/2023    ECHOCARDIOGRAM REPORT   Patient Name:   Terry Collins Date of Exam: 06/01/2023 Medical Rec #:  [id number]     Height:       74.0 in Accession #:    [id number]    Weight:       165.0 lb Date of Birth:  [birth year]     BSA:          2.003 m Patient Age:    87 years      BP:           149/91 mmHg  Patient Gender: M             HR:           84 bpm. Exam Location:  [facility] Procedure: 2D Echo, Cardiac Doppler and Color Doppler Indications:     Aortic regurgitation I35.1  History:         Patient has no prior history of Echocardiogram examinations.                  Paroxysmal Afib, Aortic valve insufficiency.  Sonographer:     Terry Collins Referring Phys:  [id number] Terry Collins Diagnosing Phys: Terry Collins IMPRESSIONS  1. Left ventricular ejection fraction, by estimation, is 50 to 55% with beat to beat variability. The left ventricle has normal function. The left ventricle has no regional wall motion abnormalities. There is moderate concentric left ventricular hypertrophy. Left ventricular diastolic parameters are indeterminate.  2. Right ventricular systolic function was not well visualized.  3. Right atrial size was mildly dilated.  4. The mitral valve is normal in structure. Trivial mitral valve regurgitation.  5. The aortic valve is tricuspid. There is mild thickening of the aortic valve. Aortic valve regurgitation is moderate to severe.  6. The inferior vena cava is dilated in size with <50% respiratory variability, suggesting right atrial pressure of 15 mmHg. FINDINGS  Left Ventricle: Left ventricular ejection fraction, by estimation, is 50 to 55%. The left ventricle has normal function. The left ventricle has no regional wall motion abnormalities. The left ventricular internal cavity size was normal in size. There is  moderate concentric left ventricular hypertrophy. Left ventricular diastolic parameters are indeterminate. Right Ventricle: Right vetricular wall thickness was not well visualized. Right ventricular systolic function was not well visualized. Left Atrium: Left atrial size was not well visualized. Right Atrium: Right atrial size was mildly dilated. Pericardium: There is no evidence of pericardial effusion. Mitral Valve: The mitral valve is normal in structure. There is mild thickening of the  mitral valve leaflet(s). Trivial mitral valve regurgitation. Tricuspid Valve: The tricuspid valve is normal in structure. Tricuspid valve regurgitation is mild. Aortic Valve: The aortic valve is tricuspid. There is mild thickening of the aortic valve. Aortic valve regurgitation is moderate to severe. Aortic valve mean gradient measures 3.0 mmHg. Aortic valve peak gradient measures 6.0 mmHg. Aortic valve area, by  VTI measures 4.21 cm. Pulmonic Valve: The pulmonic valve was normal in structure. Pulmonic valve regurgitation is mild. Aorta: The  Physician Discharge Summary  Terry Collins AVW:[id number] DOB: 06-17-1936 DOA: 06/01/2023  PCP: Terry Reichmann, MD  Admit date: 06/01/2023 Discharge date: 06/05/2023  Admitted From: home  Disposition:  SNF  Recommendations for Outpatient Follow-up:  Follow up with PCP in 1-2 weeks F/u w/ ortho surg, Dr. Signa Collins or PA Terry Collins, in 2 weeks  Home Health: no  Equipment/Devices:  Discharge Condition: stable CODE STATUS: full  Diet recommendation: Heart Healthy   Brief/Interim Summary: HPI was taken from Dr. Irving Burton:  Terry Collins is a 87 y.o. male with medical history significant of PAF on Pradaxa, Parkinson's disease, moderate aortic valve regurgitation, HLD, chronic ambulation impairment, presented with fall and left hip pain.   Patient has Parkinson's disease and at baseline uses roller walker to ambulate, according to caregiver patient usually is steady.  As per caregiver, patient has excellent exercise tolerance he goes to gym 3 times a week and able to use the treadmill for 15+ minutes each occasions and never complained about chest pain or shortness of breath.  Chronically patient has history of PAF, for which he underwent ablation in 2019 and he used to be on Tikosyn which was discontinued sometime this year by cardiology.  And patient continue to take Pradaxa for anticoagulation and denies any bleeding.  This morning, patient fell while turning to kitchen countertop to reach coffee cup and lost his balance and fell on the left hip, he denied any prodrome of lightheadedness chest pain shortness of breath blurry vision or palpitations and he did not lose consciousness.  Facility staff were able to hold him up and put him in the bed however patient continued to complain left hip pain and caregiver decided to call EMS.   ED Course: Afebrile, heart rate in the range of 90s-130s in rapid A-fib, blood pressure elevated nonhypoxic.  Hip x-ray showed mild displaced subcapital left  femoral neck fracture blood work showed K3.7, bicarb 32, creatinine 0.9, hemoglobin 14, WBC 7.9.      Discharge Diagnoses:  Principal Problem:   Hip fracture (HCC) Active Problems:   Closed left hip fracture (HCC)   Atrial fibrillation with RVR (HCC)   Parkinson disease (HCC)  Left femoral neck fracture: s/p left hip hemiarthroplasty. Continue on pradaxa. Oxy, dilaudid prn for pain    A-fib: with RVR. Persistent  a.fib. Continue on pradaxa. Echo shows EF 50-55%, diastolic function is indeterminate, no regional wall motion abnormalities, mod-severe AR, no atrial level shunts detected   Aortic stenosis: continue w/ supportive care   Chronic diastolic CHF: appears euvolemic. Restart home dose of lasix. Monitor I/Os    Parkinson's disease: continue on home dose of sinemet   Thrombocytopenia: etiology unclear. Labile   Hypokalemia: WNL today   AKI: resolved  Discharge Instructions  Discharge Instructions     Diet - low sodium heart healthy   Complete by: As directed    Discharge instructions   Complete by: As directed    F/u w/ PCP in 1-2 weeks. F/u w/ ortho surg, Dr. Signa Collins, in 2 weeks. No showering as per ortho surg   Increase activity slowly   Complete by: As directed       Allergies as of 06/05/2023       Reactions   Lactose Intolerance (gi) Diarrhea   GI- upset   Tramadol Other (See Comments)   Confusion-hallucinations        Medication List     TAKE these medications    carbidopa-levodopa 25-100 MG tablet Commonly  frontal view of the bilateral hips during surgery. The patient is undergoing total left hip arthroplasty with the femoral stem in place. There is expected left hip intra-articular air. Diffuse decreased bone mineralization. Moderate superomedial right femoroacetabular joint space narrowing. Surgical clips overlie the prostate. IMPRESSION: Intraoperative radiograph demonstrating left femoral stem placement for total left hip arthroplasty. Electronically Signed   By: Terry Garnet M.D.   On: 06/02/2023 16:33   ECHOCARDIOGRAM COMPLETE  Result Date: 06/01/2023    ECHOCARDIOGRAM REPORT   Patient Name:   Terry Collins Date of Exam: 06/01/2023 Medical Rec #:  [id number]     Height:       74.0 in Accession #:    [id number]    Weight:       165.0 lb Date of Birth:  [birth year]     BSA:          2.003 m Patient Age:    87 years      BP:           149/91 mmHg  Patient Gender: M             HR:           84 bpm. Exam Location:  [facility] Procedure: 2D Echo, Cardiac Doppler and Color Doppler Indications:     Aortic regurgitation I35.1  History:         Patient has no prior history of Echocardiogram examinations.                  Paroxysmal Afib, Aortic valve insufficiency.  Sonographer:     Terry Collins Referring Phys:  [id number] Terry Collins Diagnosing Phys: Terry Collins IMPRESSIONS  1. Left ventricular ejection fraction, by estimation, is 50 to 55% with beat to beat variability. The left ventricle has normal function. The left ventricle has no regional wall motion abnormalities. There is moderate concentric left ventricular hypertrophy. Left ventricular diastolic parameters are indeterminate.  2. Right ventricular systolic function was not well visualized.  3. Right atrial size was mildly dilated.  4. The mitral valve is normal in structure. Trivial mitral valve regurgitation.  5. The aortic valve is tricuspid. There is mild thickening of the aortic valve. Aortic valve regurgitation is moderate to severe.  6. The inferior vena cava is dilated in size with <50% respiratory variability, suggesting right atrial pressure of 15 mmHg. FINDINGS  Left Ventricle: Left ventricular ejection fraction, by estimation, is 50 to 55%. The left ventricle has normal function. The left ventricle has no regional wall motion abnormalities. The left ventricular internal cavity size was normal in size. There is  moderate concentric left ventricular hypertrophy. Left ventricular diastolic parameters are indeterminate. Right Ventricle: Right vetricular wall thickness was not well visualized. Right ventricular systolic function was not well visualized. Left Atrium: Left atrial size was not well visualized. Right Atrium: Right atrial size was mildly dilated. Pericardium: There is no evidence of pericardial effusion. Mitral Valve: The mitral valve is normal in structure. There is mild thickening of the  mitral valve leaflet(s). Trivial mitral valve regurgitation. Tricuspid Valve: The tricuspid valve is normal in structure. Tricuspid valve regurgitation is mild. Aortic Valve: The aortic valve is tricuspid. There is mild thickening of the aortic valve. Aortic valve regurgitation is moderate to severe. Aortic valve mean gradient measures 3.0 mmHg. Aortic valve peak gradient measures 6.0 mmHg. Aortic valve area, by  VTI measures 4.21 cm. Pulmonic Valve: The pulmonic valve was normal in structure. Pulmonic valve regurgitation is mild. Aorta: The  Physician Discharge Summary  Terry Collins AVW:[id number] DOB: 06-17-1936 DOA: 06/01/2023  PCP: Terry Reichmann, MD  Admit date: 06/01/2023 Discharge date: 06/05/2023  Admitted From: home  Disposition:  SNF  Recommendations for Outpatient Follow-up:  Follow up with PCP in 1-2 weeks F/u w/ ortho surg, Dr. Signa Collins or PA Terry Collins, in 2 weeks  Home Health: no  Equipment/Devices:  Discharge Condition: stable CODE STATUS: full  Diet recommendation: Heart Healthy   Brief/Interim Summary: HPI was taken from Dr. Irving Burton:  Terry Collins is a 87 y.o. male with medical history significant of PAF on Pradaxa, Parkinson's disease, moderate aortic valve regurgitation, HLD, chronic ambulation impairment, presented with fall and left hip pain.   Patient has Parkinson's disease and at baseline uses roller walker to ambulate, according to caregiver patient usually is steady.  As per caregiver, patient has excellent exercise tolerance he goes to gym 3 times a week and able to use the treadmill for 15+ minutes each occasions and never complained about chest pain or shortness of breath.  Chronically patient has history of PAF, for which he underwent ablation in 2019 and he used to be on Tikosyn which was discontinued sometime this year by cardiology.  And patient continue to take Pradaxa for anticoagulation and denies any bleeding.  This morning, patient fell while turning to kitchen countertop to reach coffee cup and lost his balance and fell on the left hip, he denied any prodrome of lightheadedness chest pain shortness of breath blurry vision or palpitations and he did not lose consciousness.  Facility staff were able to hold him up and put him in the bed however patient continued to complain left hip pain and caregiver decided to call EMS.   ED Course: Afebrile, heart rate in the range of 90s-130s in rapid A-fib, blood pressure elevated nonhypoxic.  Hip x-ray showed mild displaced subcapital left  femoral neck fracture blood work showed K3.7, bicarb 32, creatinine 0.9, hemoglobin 14, WBC 7.9.      Discharge Diagnoses:  Principal Problem:   Hip fracture (HCC) Active Problems:   Closed left hip fracture (HCC)   Atrial fibrillation with RVR (HCC)   Parkinson disease (HCC)  Left femoral neck fracture: s/p left hip hemiarthroplasty. Continue on pradaxa. Oxy, dilaudid prn for pain    A-fib: with RVR. Persistent  a.fib. Continue on pradaxa. Echo shows EF 50-55%, diastolic function is indeterminate, no regional wall motion abnormalities, mod-severe AR, no atrial level shunts detected   Aortic stenosis: continue w/ supportive care   Chronic diastolic CHF: appears euvolemic. Restart home dose of lasix. Monitor I/Os    Parkinson's disease: continue on home dose of sinemet   Thrombocytopenia: etiology unclear. Labile   Hypokalemia: WNL today   AKI: resolved  Discharge Instructions  Discharge Instructions     Diet - low sodium heart healthy   Complete by: As directed    Discharge instructions   Complete by: As directed    F/u w/ PCP in 1-2 weeks. F/u w/ ortho surg, Dr. Signa Collins, in 2 weeks. No showering as per ortho surg   Increase activity slowly   Complete by: As directed       Allergies as of 06/05/2023       Reactions   Lactose Intolerance (gi) Diarrhea   GI- upset   Tramadol Other (See Comments)   Confusion-hallucinations        Medication List     TAKE these medications    carbidopa-levodopa 25-100 MG tablet Commonly

## 2023-06-05 NOTE — Progress Notes (Signed)
Physical Therapy Treatment Patient Details Name: Terry Collins MRN: [id number] DOB: 11-28-1935 Today's Date: 06/05/2023   History of Present Illness Terry Collins is a 87 y.o. male who sustained a displaced femoral neck fracture after a fall. He under went left hip bipolar hemiarthroplasty the left  hip on 06/02/2023. Also noted for afib with RVR. PMH of CHF, Parkinsons, R TKA.    PT Comments  Pt seen for PT tx with pt agreeable, caregiver present. Pt is able to transfer STS with mod fade to min assist, initially presents with festinating gait pattern but able to increase step length with cuing. Pt ambulates in room/bathroom with RW & min assist. PT assisted pt with toileting & changing clothing. Pt anticipating d/c to SNF rehab today.   If plan is discharge home, recommend the following: A little help with walking and/or transfers;A little help with bathing/dressing/bathroom;Assistance with cooking/housework;Assist for transportation;Help with stairs or ramp for entrance   Can travel by private vehicle     No  Equipment Recommendations  Other (comment) (defer to next venue)    Recommendations for Other Services       Precautions / Restrictions Precautions Precautions: Fall;Posterior Hip Required Braces or Orthoses: Knee Immobilizer - Left (in place of abduction wedge) Knee Immobilizer - Left: Other (comment) (KI being used in place of hip abduction wedge, Dr. Allena Katz okay with pt removing it for OOB mobility (per secure chat)) Restrictions Weight Bearing Restrictions: Yes LLE Weight Bearing: Weight bearing as tolerated     Mobility  Bed Mobility Overal bed mobility: Needs Assistance Bed Mobility: Supine to Sit     Supine to sit: Mod assist, Used rails, HOB elevated (assistance/cuing to move BLE to EOB, use bed rails, shift hips to edge of bed)     General bed mobility comments: not tested, pt received & left sitting in recliner    Transfers Overall transfer level: Needs  assistance Equipment used: Rolling walker (2 wheels) Transfers: Sit to/from Stand Sit to Stand: Mod assist, Min assist           General transfer comment: STS from recliner with mod assist, fade to min assist, pt also transfers STS from [facility] over toilet with grab bars with min assist    Ambulation/Gait Ambulation/Gait assistance: Min assist Gait Distance (Feet): 28 Feet (+ 28 ft) Assistive device: Rolling walker (2 wheels) Gait Pattern/deviations: Decreased step length - right, Decreased step length - left, Decreased dorsiflexion - right, Decreased dorsiflexion - left, Decreased stride length Gait velocity: decreased     General Gait Details: Cuing for neutral alignment vs L foot internally rotated but poor return demo, occasional cuing for "big" steps initially 2/2 festinating gait pattern. Very decreased step length BLE.   Stairs             Wheelchair Mobility     Tilt Bed    Modified Rankin (Stroke Patients Only)       Balance Overall balance assessment: Needs assistance Sitting-balance support: Bilateral upper extremity supported, Feet supported Sitting balance-Leahy Scale: Fair Sitting balance - Comments: supervision static sitting EOB   Standing balance support: During functional activity, Bilateral upper extremity supported, Reliant on assistive device for balance Standing balance-Leahy Scale: Fair                              Cognition Arousal: Alert Behavior During Therapy: WFL for tasks assessed/performed Overall Cognitive Status: Within Functional Limits for tasks assessed  Exercises Total Joint Exercises Ankle Circles/Pumps: AROM, Supine, Left, 5 reps Short Arc Quad: AROM, Strengthening, Left, 10 reps, Supine Heel Slides: AAROM, Supine, Strengthening, Left, 10 reps Straight Leg Raises: Supine, AAROM, Strengthening, Left, 10 reps Long Arc Quad: AROM, Seated, Strengthening,  Left, 10 reps    General Comments General comments (skin integrity, edema, etc.): Bleeding where IV site had been removed, nurse notified & re-dressed site. Pt with urgency BM incontinence.      Pertinent Vitals/Pain Pain Assessment Pain Assessment: Faces Faces Pain Scale: Hurts a little bit Pain Location: L hip/incision site Pain Descriptors / Indicators: Discomfort Pain Intervention(s): Monitored during session    Home Living                          Prior Function            PT Goals (current goals can now be found in the care plan section) Acute Rehab PT Goals Patient Stated Goal: to return to PLOF PT Goal Formulation: With patient/family Time For Goal Achievement: 06/18/23 Potential to Achieve Goals: Fair Progress towards PT goals: Progressing toward goals    Frequency    BID      PT Plan      Co-evaluation              AM-PAC PT "6 Clicks" Mobility   Outcome Measure  Help needed turning from your back to your side while in a flat bed without using bedrails?: A Little Help needed moving from lying on your back to sitting on the side of a flat bed without using bedrails?: A Lot Help needed moving to and from a bed to a chair (including a wheelchair)?: A Little Help needed standing up from a chair using your arms (e.g., wheelchair or bedside chair)?: A Little Help needed to walk in hospital room?: A Little Help needed climbing 3-5 steps with a railing? : A Lot 6 Click Score: 16    End of Session Equipment Utilized During Treatment: Gait belt Activity Tolerance: Patient tolerated treatment well Patient left: in chair;with chair alarm set;with call bell/phone within reach;with family/visitor present Nurse Communication: Mobility status PT Visit Diagnosis: Other abnormalities of gait and mobility (R26.89);Muscle weakness (generalized) (M62.81);Difficulty in walking, not elsewhere classified (R26.2)     Time: 6295-2841 PT Time Calculation  (min) (ACUTE ONLY): 30 min  Charges:    $Therapeutic Exercise: 8-22 mins $Therapeutic Activity: 23-37 mins PT General Charges $$ ACUTE PT VISIT: 1 Visit                     Aleda Grana, PT, DPT 06/05/23, 2:08 PM   Sandi Mariscal 06/05/2023, 2:07 PM

## 2023-06-06 DIAGNOSIS — M8000XD Age-related osteoporosis with current pathological fracture, unspecified site, subsequent encounter for fracture with routine healing: Secondary | ICD-10-CM | POA: Insufficient documentation

## 2023-06-08 LAB — SURGICAL PATHOLOGY

## 2023-06-12 ENCOUNTER — Other Ambulatory Visit: Payer: Self-pay

## 2023-06-12 ENCOUNTER — Emergency Department: Payer: Medicare Other

## 2023-06-12 ENCOUNTER — Emergency Department
Admission: EM | Admit: 2023-06-12 | Discharge: 2023-06-12 | Disposition: A | Discharge status: Home / Self Care | Payer: Medicare Other | Attending: Emergency Medicine | Admitting: Emergency Medicine

## 2023-06-12 DIAGNOSIS — S4992XA Unspecified injury of left shoulder and upper arm, initial encounter: Secondary | ICD-10-CM | POA: Diagnosis present

## 2023-06-12 DIAGNOSIS — W19XXXA Unspecified fall, initial encounter: Secondary | ICD-10-CM | POA: Insufficient documentation

## 2023-06-12 DIAGNOSIS — G20C Parkinsonism, unspecified: Secondary | ICD-10-CM | POA: Insufficient documentation

## 2023-06-12 LAB — BASIC METABOLIC PANEL
Anion gap: 11 (ref 5–15)
BUN: 24 mg/dL — ABNORMAL HIGH (ref 8–23)
CO2: 28 mmol/L (ref 22–32)
Calcium: 8.5 mg/dL — ABNORMAL LOW (ref 8.9–10.3)
Chloride: 100 mmol/L (ref 98–111)
Creatinine, Ser: 0.95 mg/dL (ref 0.61–1.24)
GFR, Estimated: >60 mL/min (ref >60)
Glucose, Bld: 104 mg/dL — ABNORMAL HIGH (ref 70–99)
Potassium: 4 mmol/L (ref 3.5–5.1)
Sodium: 139 mmol/L (ref 135–145)

## 2023-06-12 LAB — CBC
HCT: 33.4 % — ABNORMAL LOW (ref 39.0–52.0)
Hemoglobin: 10.8 g/dL — ABNORMAL LOW (ref 13.0–17.0)
MCH: 34.1 pg — ABNORMAL HIGH (ref 26.0–34.0)
MCHC: 32.3 g/dL (ref 30.0–36.0)
MCV: 105.4 fL — ABNORMAL HIGH (ref 80.0–100.0)
Platelets: 234 10*3/uL (ref 150–400)
RBC: 3.17 MIL/uL — ABNORMAL LOW (ref 4.22–5.81)
RDW: 14.3 % (ref 11.5–15.5)
WBC: 11.9 10*3/uL — ABNORMAL HIGH (ref 4.0–10.5)
nRBC: 0 % (ref 0.0–0.2)

## 2023-06-12 MED ORDER — ACETAMINOPHEN 500 MG PO TABS
1000.0000 mg | ORAL_TABLET | Freq: Once | ORAL | Status: AC
  Administered 2023-06-12: 1000 mg via ORAL
  Filled 2023-06-12: qty 2

## 2023-06-12 NOTE — ED Notes (Signed)
Pt refusing to wear sling a this time.

## 2023-06-12 NOTE — ED Notes (Signed)
Pt transported to CT ?

## 2023-06-12 NOTE — ED Notes (Signed)
EMS to ED to transport pt back to facility.

## 2023-06-12 NOTE — ED Triage Notes (Signed)
Pt comes via EMS from [facility] with c/o left shoulder pain and unwitnessed fall. Pt had recent left hip surgery and then fell on Friday. Pt found Saturday morning. Pt c/o left shoulder and unable to walk now.

## 2023-06-12 NOTE — ED Provider Notes (Signed)
CT scan obtained, reviewed.  Patient has a minimally displaced glenoid fracture.  Patient is neurovascular intact.  Pain is controlled.  Placement of sling and make him nonweightbearing until orthopedic follow-up.  This will complicate his recovery from his hip.  I told him to call the orthopedist and to notify his physical therapist.  Continue pain medication as per his hip fracture.  Return precautions given.  Patient is neurovascularly intact.   Shaune Pollack, MD 06/12/23 [phone]

## 2023-06-12 NOTE — ED Notes (Signed)
[facility] Called for transport to [facility]

## 2023-06-12 NOTE — ED Notes (Signed)
Secretary to arrange ambulance transport back to facility.

## 2023-06-12 NOTE — Discharge Instructions (Addendum)
Your imaging today showed a GLENOID fracture of your shoulder.  For now, wear the sling when upright and minimize any weightbearing on the arm until cleared by Orthopedics as an outpatient. Usually, NWB for 2 weeks then gentle ROM activities.  Call Dr. Eliane Decree office, or Dr. Ernest Pine, for follow-up this week.  Notify your Physical Therapist.  Continue the tylenol and oxycodone for pain.

## 2023-06-12 NOTE — ED Notes (Signed)
Pt provided "ED HAPPY MEAL"

## 2023-06-12 NOTE — ED Provider Notes (Signed)
[facility] Provider Note   Event Date/Time   First MD Initiated Contact with Patient 06/12/23 1330     (approximate) History  Fall  HPI Terry Collins is a 87 y.o. male with a past medical history of Parkinson's disease as well as recent left hip arthroplasty who presents after a fall 2 days prior to arrival ending of of left shoulder pain.  Patient is currently in a rehab facility and states that he tried to get up and go to the bathroom but woke up on the floor.  Patient states that he has had pain in this left shoulder with any movement since that time.  Patient also complains of pain at the left hip as well at the site of the recent surgery ROS: Patient currently denies any vision changes, tinnitus, difficulty speaking, facial droop, sore throat, chest pain, shortness of breath, abdominal pain, nausea/vomiting/diarrhea, dysuria, or numbness/paresthesias in any extremity   Physical Exam  Triage Vital Signs: ED Triage Vitals  Encounter Vitals Group     BP 06/12/23 1052 96/81     Systolic BP Percentile --      Diastolic BP Percentile --      Pulse Rate 06/12/23 1052 (!) 102     Resp 06/12/23 1052 17     Temp 06/12/23 1052 98 F (36.7 C)     Temp Source 06/12/23 1052 Oral     SpO2 06/12/23 1052 95 %     Weight --      Height --      Head Circumference --      Peak Flow --      Pain Score 06/12/23 1047 7     Pain Loc --      Pain Education --      Exclude from Growth Chart --    Most recent vital signs: Vitals:   06/12/23 1342 06/12/23 1430  BP: 120/80 125/78  Pulse: 96 86  Resp: 16 16  Temp:    SpO2: 99% 100%   General: Awake, oriented x4. CV:  Good peripheral perfusion.  Resp:  Normal effort.  Abd:  No distention.  Other:  Elderly well-developed, well-nourished Caucasian male resting comfortably in no acute distress.  Tenderness to palpation over the lateral left shoulder with painful range of motion at the shoulder joint ED Results /  Procedures / Treatments  Labs (all labs ordered are listed, but only abnormal results are displayed) Labs Reviewed  BASIC METABOLIC PANEL - Abnormal; Notable for the following components:      Result Value   Glucose, Bld 104 (*)    BUN 24 (*)    Calcium 8.5 (*)    All other components within normal limits  CBC - Abnormal; Notable for the following components:   WBC 11.9 (*)    RBC 3.17 (*)    Hemoglobin 10.8 (*)    HCT 33.4 (*)    MCV 105.4 (*)    MCH 34.1 (*)    All other components within normal limits   EKG ED ECG REPORT I, Merwyn Katos, the attending physician, personally viewed and interpreted this ECG. Date: 06/12/2023 EKG Time: 1057 Rate: 80 Rhythm: normal sinus rhythm QRS Axis: normal Intervals: normal ST/T Wave abnormalities: normal Narrative Interpretation: no evidence of acute ischemia RADIOLOGY ED MD interpretation: X-ray of the left shoulder shows osteopenia with a possible fracture of the bony glenoid with cortical irregularity.  CT is recommended for additional workup  X-ray of the left  hip shows interval placement of left hip hemiarthroplasty with lateral skin staples and osteopenia  CT of the head without contrast interpreted by me shows no evidence of acute abnormalities including no intracerebral hemorrhage, obvious masses, or significant edema  CT of the cervical spine interpreted by me does not show any evidence of acute abnormalities including no acute fracture, malalignment, height loss, or dislocation -Agree with radiology assessment Official radiology report(s): DG Shoulder Left  Result Date: 06/12/2023 CLINICAL DATA:  Pain after fall EXAM: LEFT SHOULDER - 3 VIEW COMPARISON:  None Available. FINDINGS: Osteopenia. Mild degenerative changes along the [facility] joint. Preserved glenohumeral joint but there is cortical deformity along the glenoid consistent with a potential injury or fracture. Further workup with CT may be beneficial as clinically  appropriate. IMPRESSION: Osteopenia. Possible fracture of the bony glenoid with cortical irregularity. Additional workup with CT may be beneficial as clinically appropriate. Degenerative changes of the [facility] joint Electronically Signed   By: Karen Kays M.D.   On: 06/12/2023 14:23   DG Hip Unilat With Pelvis 2-3 Views Left  Result Date: 06/12/2023 CLINICAL DATA:  Pain after fall EXAM: DG HIP (WITH OR WITHOUT PELVIS) 3V LEFT COMPARISON:  Preop x-ray 06/01/2023 FINDINGS: Severe osteopenia. There has been placement of a left hemiarthroplasty with Press-Fit acetabular cup and cemented femoral component with lateral skin staples. Expected alignment. No hardware failure. Moderate joint space loss of the right hip. Small osteophytes. Mild degenerative changes elsewhere. Surgical changes, treatment changes in the area of the prostate. Degenerative changes of the visualized portions of the lumbar spine. IMPRESSION: Interval placement left hip hemiarthroplasty. Lateral skin staples. Osteopenia with degenerative changes elsewhere. Presumed brachytherapy changes in the area of the prostate Electronically Signed   By: Karen Kays M.D.   On: 06/12/2023 14:10   CT HEAD WO CONTRAST ( )  Result Date: 06/12/2023 CLINICAL DATA:  Provided history: Unwitnessed fall. Inability to walk. Left shoulder pain. EXAM: CT HEAD WITHOUT CONTRAST CT CERVICAL SPINE WITHOUT CONTRAST TECHNIQUE: Multidetector CT imaging of the head and cervical spine was performed following the standard protocol without intravenous contrast. Multiplanar CT image reconstructions of the cervical spine were also generated. RADIATION DOSE REDUCTION: This exam was performed according to the departmental dose-optimization program which includes automated exposure control, adjustment of the mA and/or kV according to patient size and/or use of iterative reconstruction technique. COMPARISON:  Head CT 08/12/2021. FINDINGS: CT HEAD FINDINGS Brain: Generalized  parenchymal atrophy. Patchy and ill-defined hypoattenuation within the cerebral white matter, nonspecific but compatible with mild for age chronic small vessel ischemic disease. There is no acute intracranial hemorrhage. No demarcated cortical infarct. No extra-axial fluid collection. No evidence of an intracranial mass. No midline shift. Vascular: No hyperdense vessel.  Atherosclerotic calcifications. Skull: No calvarial fracture or aggressive osseous lesion. Sinuses/Orbits: No orbital mass or acute orbital finding at the imaged levels. Mild mucosal thickening and/or small-volume secretions within the bilateral maxillary sinuses at the imaged levels. Trace mucosal thickening scattered within bilateral ethmoid air cells. CT CERVICAL SPINE FINDINGS Alignment: Mild dextrocurvature of the cervical spine, possibly positional. Nonspecific straightening of the expected cervical lordosis. 2 mm C2-C3 grade 1 anterolisthesis. Slight T1-T2 grade 1 anterolisthesis. Skull base and vertebrae: The basion-dental and atlanto-dental intervals are maintained.No evidence of acute fracture to the cervical spine. C3-C4 vertebral body ankylosis. Right-sided facet ankylosis also present at this level. Soft tissues and spinal canal: No prevertebral fluid or swelling. No visible canal hematoma. Disc levels: Cervical spondylosis with multilevel disc space narrowing, disc  bulges/central disc protrusions, posterior disc osteophyte complexes, endplate spurring, uncovertebral hypertrophy and facet arthrosis. At the non-fused levels, disc space narrowing is greatest at C4-C5, C5-C6, C6-C7 and C7-T1 (advanced at these levels). No appreciable high-grade spinal canal stenosis. Multilevel bony neural foraminal narrowing. Upper chest: No visible pneumothorax. Biapical pleuroparenchymal scarring. IMPRESSION: CT head: 1.  No evidence of an acute intracranial abnormality. 2. Mild chronic small vessel ischemic changes within the cerebral white matter. 3.  Generalized parenchymal atrophy. 4. Mild paranasal sinus disease at the imaged levels, as described. CT cervical spine: 1. No evidence of an acute cervical spine fracture. 2. Nonspecific straightening of the expected cervical lordosis. 3. Mild dextrocurvature of the cervical spine, possibly positional. 4. Mild grade 1 anterolisthesis at C2-C3 and T1-T2. 5. Cervical spondylosis as described. 6. Vertebral body ankylosis and right-sided facet ankylosis at C3-C4. Electronically Signed   By: Jackey Loge D.O.   On: 06/12/2023 13:44   CT Cervical Spine Wo Contrast  Result Date: 06/12/2023 CLINICAL DATA:  Provided history: Unwitnessed fall. Inability to walk. Left shoulder pain. EXAM: CT HEAD WITHOUT CONTRAST CT CERVICAL SPINE WITHOUT CONTRAST TECHNIQUE: Multidetector CT imaging of the head and cervical spine was performed following the standard protocol without intravenous contrast. Multiplanar CT image reconstructions of the cervical spine were also generated. RADIATION DOSE REDUCTION: This exam was performed according to the departmental dose-optimization program which includes automated exposure control, adjustment of the mA and/or kV according to patient size and/or use of iterative reconstruction technique. COMPARISON:  Head CT 08/12/2021. FINDINGS: CT HEAD FINDINGS Brain: Generalized parenchymal atrophy. Patchy and ill-defined hypoattenuation within the cerebral white matter, nonspecific but compatible with mild for age chronic small vessel ischemic disease. There is no acute intracranial hemorrhage. No demarcated cortical infarct. No extra-axial fluid collection. No evidence of an intracranial mass. No midline shift. Vascular: No hyperdense vessel.  Atherosclerotic calcifications. Skull: No calvarial fracture or aggressive osseous lesion. Sinuses/Orbits: No orbital mass or acute orbital finding at the imaged levels. Mild mucosal thickening and/or small-volume secretions within the bilateral maxillary sinuses  at the imaged levels. Trace mucosal thickening scattered within bilateral ethmoid air cells. CT CERVICAL SPINE FINDINGS Alignment: Mild dextrocurvature of the cervical spine, possibly positional. Nonspecific straightening of the expected cervical lordosis. 2 mm C2-C3 grade 1 anterolisthesis. Slight T1-T2 grade 1 anterolisthesis. Skull base and vertebrae: The basion-dental and atlanto-dental intervals are maintained.No evidence of acute fracture to the cervical spine. C3-C4 vertebral body ankylosis. Right-sided facet ankylosis also present at this level. Soft tissues and spinal canal: No prevertebral fluid or swelling. No visible canal hematoma. Disc levels: Cervical spondylosis with multilevel disc space narrowing, disc bulges/central disc protrusions, posterior disc osteophyte complexes, endplate spurring, uncovertebral hypertrophy and facet arthrosis. At the non-fused levels, disc space narrowing is greatest at C4-C5, C5-C6, C6-C7 and C7-T1 (advanced at these levels). No appreciable high-grade spinal canal stenosis. Multilevel bony neural foraminal narrowing. Upper chest: No visible pneumothorax. Biapical pleuroparenchymal scarring. IMPRESSION: CT head: 1.  No evidence of an acute intracranial abnormality. 2. Mild chronic small vessel ischemic changes within the cerebral white matter. 3. Generalized parenchymal atrophy. 4. Mild paranasal sinus disease at the imaged levels, as described. CT cervical spine: 1. No evidence of an acute cervical spine fracture. 2. Nonspecific straightening of the expected cervical lordosis. 3. Mild dextrocurvature of the cervical spine, possibly positional. 4. Mild grade 1 anterolisthesis at C2-C3 and T1-T2. 5. Cervical spondylosis as described. 6. Vertebral body ankylosis and right-sided facet ankylosis at C3-C4. Electronically Signed  By: Jackey Loge D.O.   On: 06/12/2023 13:44   PROCEDURES: Critical Care performed: No Procedures MEDICATIONS ORDERED IN ED: Medications - No  data to display IMPRESSION / MDM / ASSESSMENT AND PLAN / ED COURSE  I reviewed the triage vital signs and the nursing notes.                             The patient is on the cardiac monitor to evaluate for evidence of arrhythmia and/or significant heart rate changes. Patient's presentation is most consistent with acute presentation with potential threat to life or bodily function. Presenting after a fall that occurred just prior to arrival, resulting in injury to the left shoulder and left hip. The mechanism of injury was a mechanical ground level fall without syncope or near-syncope. The current level of pain is moderate. There was no loss of consciousness, confusion, seizure, or memory impairment. There is not a laceration associated with the injury. Denies neck pain. The patient does not take blood thinner medications. Denies vomiting, numbness/weakness, fever  Dispo: Care of this patient will be signed out to the oncoming physician at the end of my shift.  All pertinent patient information conveyed and all questions answered.  All further care and disposition decisions will be made by the oncoming physician.  FINAL CLINICAL IMPRESSION(S) / ED DIAGNOSES   Final diagnoses:  Fall, initial encounter  Injury of left shoulder, initial encounter   Rx / DC Orders   ED Discharge Orders     None      Note:  This document was prepared using Dragon voice recognition software and may include unintentional dictation errors.   Merwyn Katos, MD 06/19/23 2228

## 2023-06-22 ENCOUNTER — Encounter (INDEPENDENT_AMBULATORY_CARE_PROVIDER_SITE_OTHER): Payer: Medicare Other | Admitting: Vascular Surgery

## 2023-06-26 ENCOUNTER — Encounter (INDEPENDENT_AMBULATORY_CARE_PROVIDER_SITE_OTHER): Payer: Medicare Other | Admitting: Vascular Surgery

## 2023-07-07 IMAGING — CR DG LUMBAR SPINE COMPLETE 4+V
5 series · 5 of 5 positions shown · non-contrast
Comparison: None.

CLINICAL DATA: Back pain

EXAM:
LUMBAR SPINE - COMPLETE 4+ VIEW

[l-spine ap]
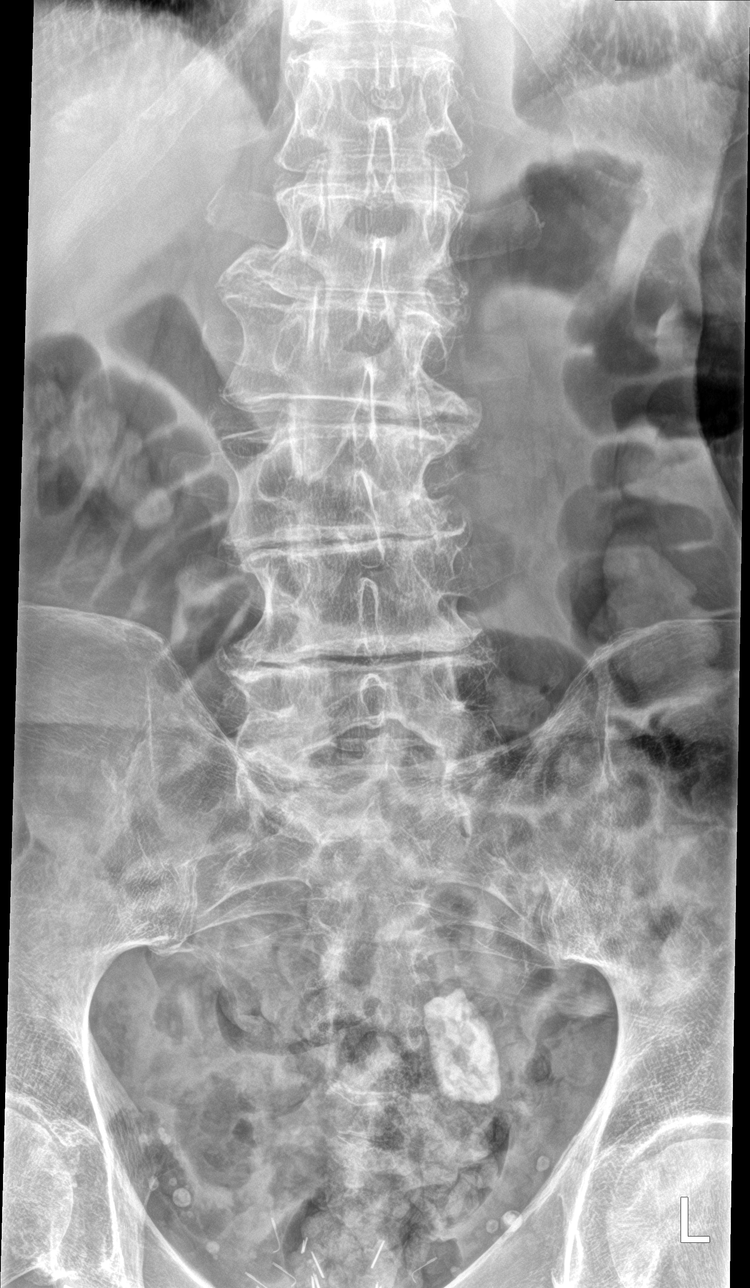

[l-spine obl (1 of 2)]
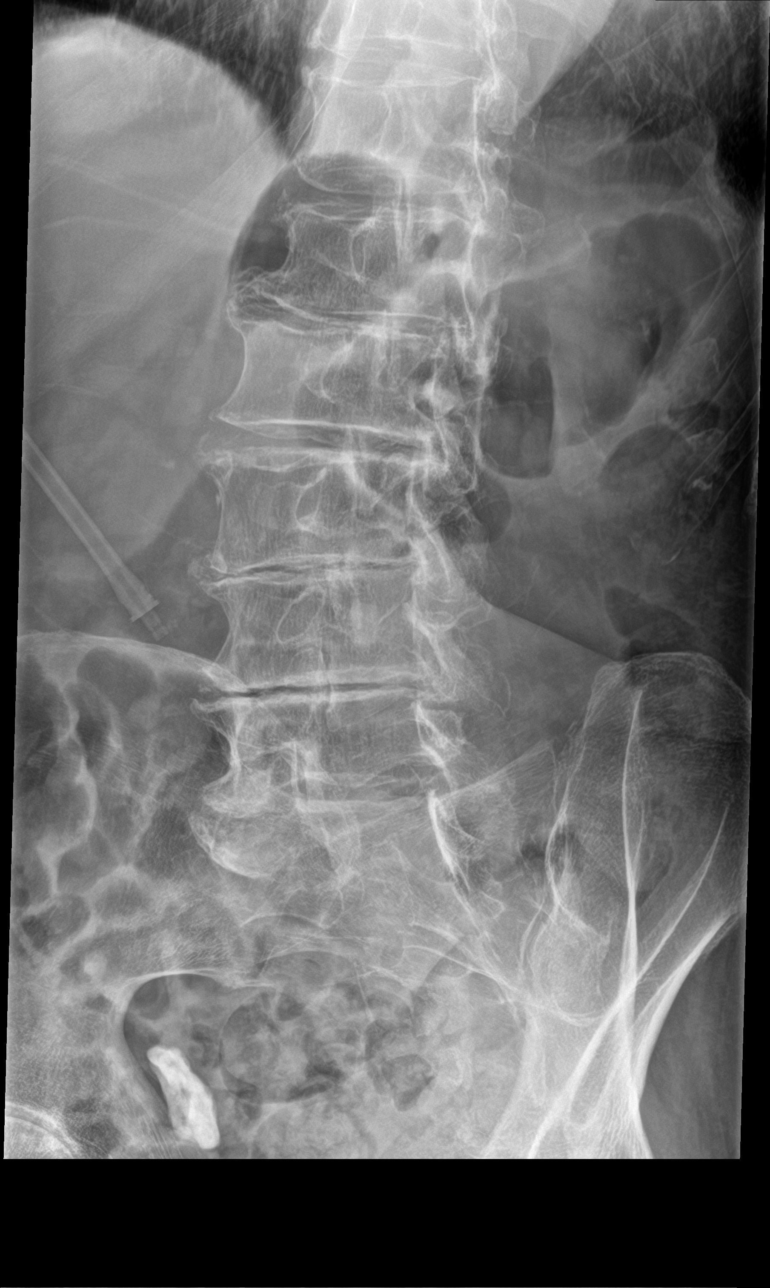

[l-spine obl (2 of 2)]
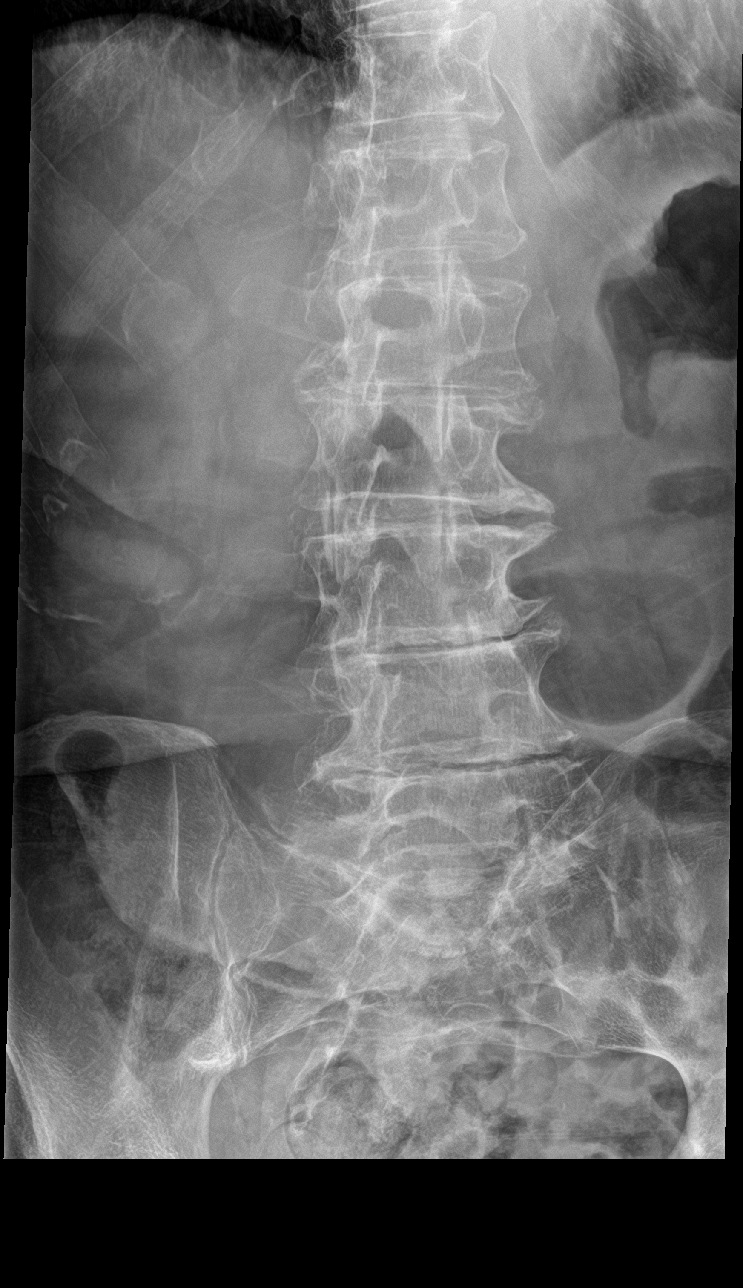

[l-spine lat]
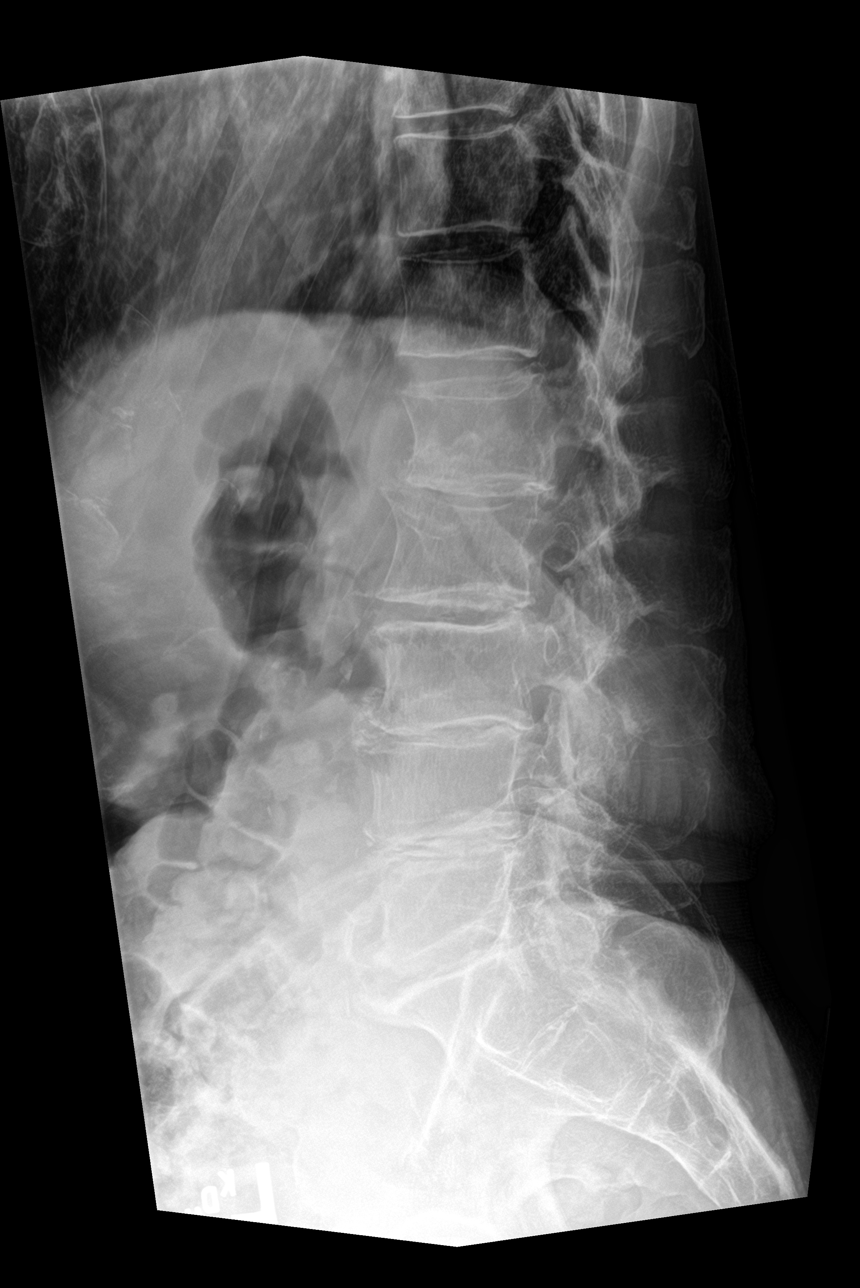

[l-spine spot]
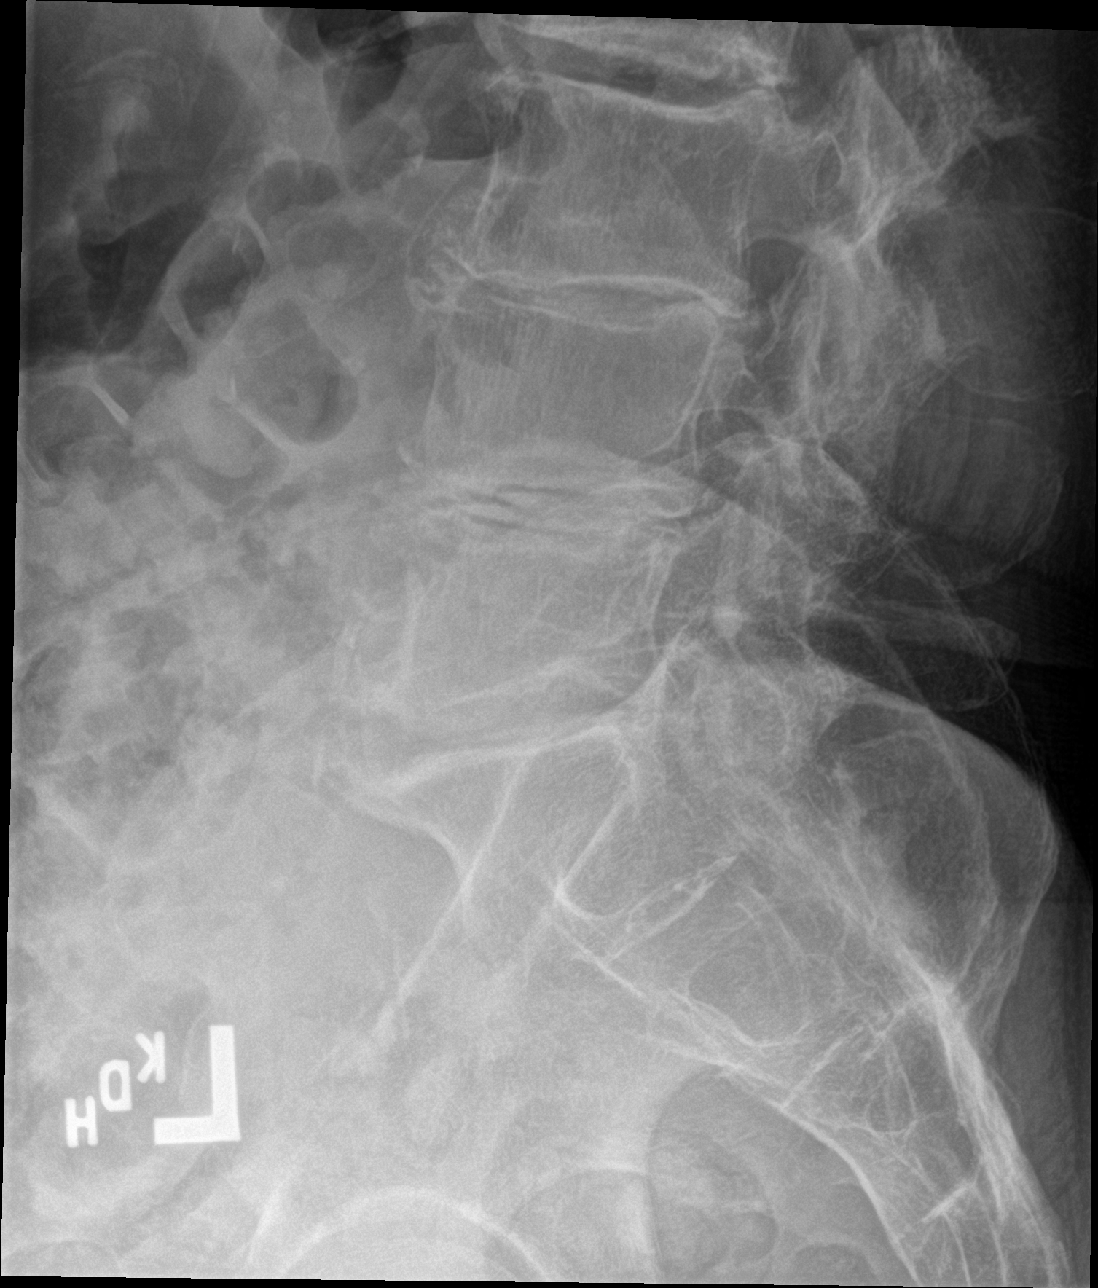

[5 of 5 positions shown; findings below may reference images not displayed]

FINDINGS: Osteopenia is seen in bony structures. There is mild
dextroscoliosis. There is slight decrease in height of bodies of L1
and L2 vertebrae. There is no demonstrable radiolucent fracture
line. Degenerative changes are noted with disc space narrowing, bony
spurs and facet hypertrophy throughout lumbar spine. There is
possible spinal stenosis and encroachment of neural foramina in the
lumbar spine. There are metallic densities in the pelvis, possibly
suggesting previous brachytherapy. There is a large coarse
calcification in the pelvis seen in the 2 of the images which may
suggest an artifact outside the patient's body or soft tissue
calcification.
IMPRESSION: Severe degenerative changes are noted in the lumbar spine with disc
space narrowing, bony spurs, facet hypertrophy and possibly spinal
stenosis and encroachment of neural foramina at multiple levels.

There is slight decrease in height of bodies of L1 and L2 vertebrae
without definite demonstrable fracture line. This may suggest old
mild compression fractures. If there are focal clinical findings in
this region, follow-up MRI may be considered.

## 2023-08-21 ENCOUNTER — Observation Stay
Admission: EM | Admit: 2023-08-21 | Discharge: 2023-08-22 | Disposition: A | Discharge status: Home / Self Care | Payer: Medicare Other | Attending: Internal Medicine | Admitting: Internal Medicine

## 2023-08-21 ENCOUNTER — Inpatient Hospital Stay: Payer: Medicare Other

## 2023-08-21 ENCOUNTER — Emergency Department: Payer: Medicare Other

## 2023-08-21 ENCOUNTER — Other Ambulatory Visit: Payer: Self-pay

## 2023-08-21 DIAGNOSIS — J9601 Acute respiratory failure with hypoxia: Secondary | ICD-10-CM | POA: Diagnosis not present

## 2023-08-21 DIAGNOSIS — A419 Sepsis, unspecified organism: Principal | ICD-10-CM

## 2023-08-21 DIAGNOSIS — Z1152 Encounter for screening for COVID-19: Secondary | ICD-10-CM | POA: Diagnosis not present

## 2023-08-21 DIAGNOSIS — D531 Other megaloblastic anemias, not elsewhere classified: Secondary | ICD-10-CM

## 2023-08-21 DIAGNOSIS — Z96651 Presence of right artificial knee joint: Secondary | ICD-10-CM | POA: Diagnosis not present

## 2023-08-21 DIAGNOSIS — G20C Parkinsonism, unspecified: Secondary | ICD-10-CM | POA: Diagnosis not present

## 2023-08-21 DIAGNOSIS — R509 Fever, unspecified: Secondary | ICD-10-CM | POA: Insufficient documentation

## 2023-08-21 DIAGNOSIS — J189 Pneumonia, unspecified organism: Secondary | ICD-10-CM | POA: Diagnosis present

## 2023-08-21 DIAGNOSIS — I959 Hypotension, unspecified: Secondary | ICD-10-CM | POA: Diagnosis present

## 2023-08-21 DIAGNOSIS — I5033 Acute on chronic diastolic (congestive) heart failure: Secondary | ICD-10-CM | POA: Diagnosis not present

## 2023-08-21 DIAGNOSIS — I4891 Unspecified atrial fibrillation: Secondary | ICD-10-CM

## 2023-08-21 DIAGNOSIS — I110 Hypertensive heart disease with heart failure: Secondary | ICD-10-CM | POA: Insufficient documentation

## 2023-08-21 DIAGNOSIS — R0602 Shortness of breath: Secondary | ICD-10-CM | POA: Diagnosis present

## 2023-08-21 DIAGNOSIS — Z79899 Other long term (current) drug therapy: Secondary | ICD-10-CM | POA: Diagnosis not present

## 2023-08-21 DIAGNOSIS — I4819 Other persistent atrial fibrillation: Secondary | ICD-10-CM | POA: Diagnosis not present

## 2023-08-21 DIAGNOSIS — Z96642 Presence of left artificial hip joint: Secondary | ICD-10-CM | POA: Insufficient documentation

## 2023-08-21 DIAGNOSIS — G20A1 Parkinson's disease without dyskinesia, without mention of fluctuations: Secondary | ICD-10-CM

## 2023-08-21 LAB — CBC WITH DIFFERENTIAL/PLATELET
Abs Immature Granulocytes: 0.05 10*3/uL (ref 0.00–0.07)
Basophils Absolute: 0 10*3/uL (ref 0.0–0.1)
Basophils Relative: 0 %
Eosinophils Absolute: 0.1 10*3/uL (ref 0.0–0.5)
Eosinophils Relative: 1 %
HCT: 33.7 % — ABNORMAL LOW (ref 39.0–52.0)
Hemoglobin: 10.9 g/dL — ABNORMAL LOW (ref 13.0–17.0)
Immature Granulocytes: 1 %
Lymphocytes Relative: 11 %
Lymphs Abs: 1 10*3/uL (ref 0.7–4.0)
MCH: 33.9 pg (ref 26.0–34.0)
MCHC: 32.3 g/dL (ref 30.0–36.0)
MCV: 104.7 fL — ABNORMAL HIGH (ref 80.0–100.0)
Monocytes Absolute: 1 10*3/uL (ref 0.1–1.0)
Monocytes Relative: 10 %
Neutro Abs: 7.1 10*3/uL (ref 1.7–7.7)
Neutrophils Relative %: 77 %
Platelets: 259 10*3/uL (ref 150–400)
RBC: 3.22 MIL/uL — ABNORMAL LOW (ref 4.22–5.81)
RDW: 14.7 % (ref 11.5–15.5)
WBC: 9.2 10*3/uL (ref 4.0–10.5)
nRBC: 0 % (ref 0.0–0.2)

## 2023-08-21 LAB — RESP PANEL BY RT-PCR (RSV, FLU A&B, COVID)  RVPGX2
Influenza A by PCR: NEGATIVE
Influenza B by PCR: NEGATIVE
Resp Syncytial Virus by PCR: NEGATIVE
SARS Coronavirus 2 by RT PCR: NEGATIVE

## 2023-08-21 LAB — COMPREHENSIVE METABOLIC PANEL
ALT: 8 U/L (ref 0–44)
AST: 20 U/L (ref 15–41)
Albumin: 3.1 g/dL — ABNORMAL LOW (ref 3.5–5.0)
Alkaline Phosphatase: 181 U/L — ABNORMAL HIGH (ref 38–126)
Anion gap: 10 (ref 5–15)
BUN: 19 mg/dL (ref 8–23)
CO2: 26 mmol/L (ref 22–32)
Calcium: 9 mg/dL (ref 8.9–10.3)
Chloride: 101 mmol/L (ref 98–111)
Creatinine, Ser: 0.96 mg/dL (ref 0.61–1.24)
GFR, Estimated: >60 mL/min (ref >60)
Glucose, Bld: 136 mg/dL — ABNORMAL HIGH (ref 70–99)
Potassium: 3.7 mmol/L (ref 3.5–5.1)
Sodium: 137 mmol/L (ref 135–145)
Total Bilirubin: 1.1 mg/dL (ref <1.2)
Total Protein: 7.1 g/dL (ref 6.5–8.1)

## 2023-08-21 LAB — URINALYSIS, W/ REFLEX TO CULTURE (INFECTION SUSPECTED)
Bacteria, UA: NONE SEEN
Bilirubin Urine: NEGATIVE
Glucose, UA: NEGATIVE mg/dL
Ketones, ur: 5 mg/dL — AB
Leukocytes,Ua: NEGATIVE
Nitrite: NEGATIVE
Protein, ur: 100 mg/dL — AB
Specific Gravity, Urine: 1.027 (ref 1.005–1.030)
pH: 5 (ref 5.0–8.0)

## 2023-08-21 LAB — PROTIME-INR
INR: 1.6 — ABNORMAL HIGH (ref 0.8–1.2)
Prothrombin Time: 19.3 s — ABNORMAL HIGH (ref 11.4–15.2)

## 2023-08-21 LAB — LACTIC ACID, PLASMA
Lactic Acid, Venous: 1.8 mmol/L (ref 0.5–1.9)
Lactic Acid, Venous: 1 mmol/L (ref 0.5–1.9)

## 2023-08-21 LAB — BRAIN NATRIURETIC PEPTIDE: B Natriuretic Peptide: 563.5 pg/mL — ABNORMAL HIGH (ref 0.0–100.0)

## 2023-08-21 LAB — TROPONIN I (HIGH SENSITIVITY): Troponin I (High Sensitivity): 27 ng/L — ABNORMAL HIGH (ref <18)

## 2023-08-21 LAB — PROCALCITONIN: Procalcitonin: <0.1 ng/mL

## 2023-08-21 MED ORDER — POLYETHYLENE GLYCOL 3350 17 G PO PACK: 17.0000 g | PACK | Freq: Every day | ORAL | Status: DC | PRN

## 2023-08-21 MED ORDER — ONDANSETRON HCL 4 MG/2ML IJ SOLN: 4.0000 mg | Freq: Four times a day (QID) | INTRAMUSCULAR | Status: DC | PRN

## 2023-08-21 MED ORDER — CARBIDOPA-LEVODOPA 25-100 MG PO TABS
1.0000 | ORAL_TABLET | Freq: Once | ORAL | Status: AC
  Administered 2023-08-21: 1 via ORAL
  Filled 2023-08-21: qty 1

## 2023-08-21 MED ORDER — FERROUS SULFATE 325 (65 FE) MG PO TABS
325.0000 mg | ORAL_TABLET | Freq: Every day | ORAL | Status: DC
  Filled 2023-08-21: qty 1

## 2023-08-21 MED ORDER — GABAPENTIN 100 MG PO CAPS
100.0000 mg | ORAL_CAPSULE | Freq: Two times a day (BID) | ORAL | Status: DC
  Administered 2023-08-21 – 2023-08-22 (×2): 100 mg via ORAL
  Filled 2023-08-21 (×2): qty 1

## 2023-08-21 MED ORDER — MIDODRINE HCL 5 MG PO TABS
5.0000 mg | ORAL_TABLET | Freq: Three times a day (TID) | ORAL | Status: DC
  Administered 2023-08-22 (×2): 5 mg via ORAL
  Filled 2023-08-21 (×2): qty 1

## 2023-08-21 MED ORDER — FUROSEMIDE 10 MG/ML IJ SOLN
20.0000 mg | Freq: Once | INTRAMUSCULAR | Status: AC
  Administered 2023-08-21: 20 mg via INTRAVENOUS
  Filled 2023-08-21: qty 4

## 2023-08-21 MED ORDER — VITAMIN B-12 1000 MCG PO TABS
1000.0000 ug | ORAL_TABLET | Freq: Every morning | ORAL | Status: DC
  Administered 2023-08-22: 1000 ug via ORAL
  Filled 2023-08-21: qty 2

## 2023-08-21 MED ORDER — ACETAMINOPHEN 650 MG RE SUPP: 650.0000 mg | Freq: Four times a day (QID) | RECTAL | Status: DC | PRN

## 2023-08-21 MED ORDER — CARBIDOPA-LEVODOPA 25-100 MG PO TABS
1.0000 | ORAL_TABLET | Freq: Every day | ORAL | Status: DC
  Administered 2023-08-21 – 2023-08-22 (×5): 1 via ORAL
  Filled 2023-08-21 (×8): qty 1

## 2023-08-21 MED ORDER — SODIUM CHLORIDE 0.9% FLUSH
3.0000 mL | Freq: Two times a day (BID) | INTRAVENOUS | Status: DC
  Administered 2023-08-21 – 2023-08-22 (×2): 3 mL via INTRAVENOUS

## 2023-08-21 MED ORDER — ONDANSETRON HCL 4 MG PO TABS: 4.0000 mg | ORAL_TABLET | Freq: Four times a day (QID) | ORAL | Status: DC | PRN

## 2023-08-21 MED ORDER — DABIGATRAN ETEXILATE MESYLATE 150 MG PO CAPS
150.0000 mg | ORAL_CAPSULE | Freq: Two times a day (BID) | ORAL | Status: DC
Start: 2023-08-21 — End: 2023-08-22
  Administered 2023-08-21 – 2023-08-22 (×2): 150 mg via ORAL
  Filled 2023-08-21 (×3): qty 1

## 2023-08-21 MED ORDER — FOLIC ACID 1 MG PO TABS
1.0000 mg | ORAL_TABLET | Freq: Every morning | ORAL | Status: DC
  Administered 2023-08-22: 1 mg via ORAL
  Filled 2023-08-21: qty 1

## 2023-08-21 MED ORDER — SODIUM CHLORIDE 0.9 % IV SOLN
500.0000 mg | INTRAVENOUS | Status: DC
  Administered 2023-08-21: 500 mg via INTRAVENOUS
  Filled 2023-08-21 (×2): qty 5

## 2023-08-21 MED ORDER — CYCLOSPORINE 0.05 % OP EMUL
1.0000 [drp] | Freq: Two times a day (BID) | OPHTHALMIC | Status: DC
  Filled 2023-08-21 (×3): qty 30

## 2023-08-21 MED ORDER — SODIUM CHLORIDE 0.9 % IV SOLN
2.0000 g | INTRAVENOUS | Status: DC
  Administered 2023-08-21: 2 g via INTRAVENOUS
  Filled 2023-08-21 (×2): qty 20

## 2023-08-21 MED ORDER — SODIUM CHLORIDE 0.9 % IV BOLUS: 1000.0000 mL | Freq: Once | INTRAVENOUS | Status: DC

## 2023-08-21 MED ORDER — ACETAMINOPHEN 325 MG PO TABS: 650.0000 mg | ORAL_TABLET | Freq: Four times a day (QID) | ORAL | Status: DC | PRN

## 2023-08-21 NOTE — ED Triage Notes (Addendum)
Pt to ED via [facility] from home for SOB and diarrhea since last night. EMS reports SPO2 70s-80s on RA. No hx COPD or asthma. Sats 85% RA with ems. Room air spo2 88% on arrival. Pt on 3L [place] SPO2 95%. Pt reports non-productive cough for unknown amount of days. Reports he came home from rehab facility for hip replacement 7 days ago. Pt a&o x4  HR 80-100 AFIB BP 161/92  Temp 98.8 F

## 2023-08-21 NOTE — Progress Notes (Signed)
CODE SEPSIS - PHARMACY COMMUNICATION  **Broad Spectrum Antibiotics should be administered within 1 hour of Sepsis diagnosis**  Time Code Sepsis Called/Page Received: 1702  Antibiotics Ordered: Ceftriaxone & azithromycin  Time of 1st antibiotic administration: 1804  Additional action taken by pharmacy: Messaged and called RN  Terry Collins 08/21/2023  6:18 PM

## 2023-08-21 NOTE — Assessment & Plan Note (Signed)
Brief episode of RVR on arrival, now with normalized ventricular rates. - Continue home Pradaxa -- Telemetry

## 2023-08-21 NOTE — Sepsis Progress Note (Signed)
[facility] will follow per sepsis protocol  

## 2023-08-21 NOTE — Assessment & Plan Note (Signed)
Chronic, likely due to Parkinson's - Continue home midodrine --Maintain MAP > 65

## 2023-08-21 NOTE — Assessment & Plan Note (Addendum)
Patient is presenting to the ED with several day history of dry cough and shortness of breath, found to be notably hypoxic in the 70s-80s by EMS, now saturating at 97% on room air.  Given multifocal opacities seen on chest x-ray, differential includes community-acquired pneumonia versus HFpEF exacerbation (given elevated BNP).  - Telemetry monitoring - Continue supplemental oxygen to maintain oxygen saturation above 88% - Wean as tolerated - Continue azithromycin and ceftriaxone - Procalcitonin pending - If negative, will order full respiratory viral panel - CT of the chest ordered - Management of HFpEF as noted below

## 2023-08-21 NOTE — Assessment & Plan Note (Addendum)
Per chart review, patient has a history of HFpEF with last EF in November 2024 that demonstrated EF of 50%, mild LVH and undeterminable diastolic function.  At that time, IVC was noted to be dilated with normal respiratory collapse though.  Previous echocardiogram obtained in November 2024, similar findings were noted, however IVC was dilated with abnormal respiratory collapse suggesting RAP of 15.  BNP is now elevated at 563, suggestive of acute exacerbation, however patient does not appear marked volume overloaded peripherally.   - Telemetry monitoring - One time dose of Lasix 20 mg IV - Daily weights - Strict in and out - Daily magnesium and BMP

## 2023-08-21 NOTE — Assessment & Plan Note (Signed)
Chronic history of macrocytic anemia with baseline hemoglobin between 10-11.  Currently at baseline.  B12 last checked in 2021. - Vitamin B12 pending

## 2023-08-21 NOTE — Progress Notes (Incomplete)
CODE SEPSIS - PHARMACY COMMUNICATION  **Broad Spectrum Antibiotics should be administered within 1 hour of Sepsis diagnosis**  Time Code Sepsis Called/Page Received: 1702  Antibiotics Ordered: Ceftriaxone & azithromycin  Time of 1st antibiotic administration: ***  Additional action taken by pharmacy: Tressie Ellis 08/21/2023  5:02 PM

## 2023-08-21 NOTE — H&P (Addendum)
History and Physical    Patient: Terry Collins ZOX:[id number] DOB: 1936-07-03 DOA: 08/21/2023 DOS: the patient was seen and examined on 08/21/2023 PCP: Barbette Reichmann, MD  Patient coming from: Home  Chief Complaint:  Chief Complaint  Patient presents with   Shortness of Breath   HPI: Terry Collins is a 87 y.o. male with medical history significant of persistent atrial fibrillation s/p cardioversion (2022) on Pradaxa, Parkinson's disease, HFpEF, moderate AR, hyperlipidemia, chronic anemia, who presents to the ED due to shortness of breath.  Terry Collins states Terry Collins has been experiencing a dry cough for approximately 1 day and then developed shortness of breathe today. Terry Collins also began to experience non-bloody, non-melanotic diarrhea. Terry Collins had a one time low grade fever of 100.0. Terry Collins denies any nausea, vomiting, chest pain, palpitations, congestion. Terry Collins notes that his lower extremity edema has been much better over the last month and Terry Collins is no longer on Lasix. Terry Collins denies any orthopnea.   ED course: On arrival to the ED, patient was hypertensive at 149/88 with heart rate of 99.  Terry Collins was saturating at 96% on 3 L.  Terry Collins was afebrile 97.8. Initial workup notable for hemoglobin of 10.9, MCV of 104, glucose 136, alkaline phosphatase 181, creatinine 0.96 with GFR above 60.  Troponin 27 with BNP of 563.  Lactic acid within normal limits.  Urinalysis with small hematuria, ketonuria.  Chest x-ray was obtained that demonstrated chronic lung parenchymal changes with superimposed right upper/mid and left retrocardiac opacities concerning for pneumonia.  Patient started on azithromycin and ceftriaxone.  [facility] contacted for admission.  Review of Systems: As mentioned in the history of present illness. All other systems reviewed and are negative.  Past Medical History:  Diagnosis Date   Anemia    Aortic valve insufficiency    Arthritis    hands   Atherosclerosis of aorta (HCC)    Carotid stenosis    History of kidney  stones 2010   Mixed hyperlipidemia    Paroxysmal atrial fibrillation (HCC)    Venous insufficiency of both lower extremities    Past Surgical History:  Procedure Laterality Date   CARDIAC ELECTROPHYSIOLOGY STUDY AND ABLATION  2019   [facility]   CARDIOVERSION N/A 07/07/2021   Procedure: CARDIOVERSION;  Surgeon: Lamar Blinks, MD;  Location: [facility];  Service: Cardiovascular;  Laterality: N/A;   cardiovesion     EYE SURGERY Bilateral 2012   cataracts   HERNIA REPAIR Left 2008   inguinal   HIP ARTHROPLASTY Left 06/02/2023   Procedure: ARTHROPLASTY BIPOLAR HIP (HEMIARTHROPLASTY);  Surgeon: Signa Kell, MD;  Location: [facility];  Service: Orthopedics;  Laterality: Left;   INGUINAL HERNIA REPAIR Right 06/24/2016   Procedure: HERNIA REPAIR INGUINAL ADULT;  Surgeon: Nadeen Landau, MD;  Location: [facility];  Service: General;  Laterality: Right;   TOTAL KNEE ARTHROPLASTY Right 07/16/2020   Procedure: RIGHT TOTAL KNEE ARTHROPLASTY;  Surgeon: Juanell Fairly, MD;  Location: [facility];  Service: Orthopedics;  Laterality: Right;   XI ROBOTIC ASSISTED INGUINAL HERNIA REPAIR WITH MESH Bilateral 01/20/2020   Procedure: XI ROBOTIC ASSISTED INGUINAL HERNIA REPAIR WITH MESH;  Surgeon: Carolan Shiver, MD;  Location: [facility];  Service: General;  Laterality: Bilateral;   XI ROBOTIC ASSISTED INGUINAL HERNIA REPAIR WITH MESH Right 12/16/2020   Procedure: XI ROBOTIC ASSISTED INGUINAL HERNIA REPAIR WITH MESH;  Surgeon: Carolan Shiver, MD;  Location: [facility];  Service: General;  Laterality: Right;   Social History:  reports that Terry Collins has never smoked. Terry Collins has never  used smokeless tobacco. Terry Collins reports current alcohol use of about 1.0 standard drink of alcohol per week. Terry Collins reports that Terry Collins does not use drugs.  Allergies  Allergen Reactions   Lactose Intolerance (Gi) Diarrhea    GI- upset   Tramadol Other (See Comments)    Confusion-hallucinations    Family History  Problem Relation Age of  Onset   Heart attack Father    Atrial fibrillation Brother     Prior to Admission medications   Medication Sig Start Date End Date Taking? Authorizing Provider  Calcium Polycarbophil (FIBER-CAPS PO) Take 3 capsules by mouth in the morning.    [provider]  carbidopa-levodopa (SINEMET IR) 25-100 MG tablet Take 1-2 tablets by mouth 5 (five) times daily. Takes two tablets every morning and one tablet rest of day 07/11/22   [provider]  cycloSPORINE (RESTASIS) 0.05 % ophthalmic emulsion Place 1 drop into both eyes 2 (two) times daily.    [provider]  dabigatran (PRADAXA) 150 MG CAPS capsule Take 150 mg by mouth 2 (two) times daily.    [provider]  ferrous sulfate 325 (65 FE) MG tablet Take 325 mg by mouth at bedtime.    [provider]  folic acid (FOLVITE) 800 MCG tablet Take 800 mcg by mouth in the morning.    [provider]  furosemide (LASIX) 40 MG tablet Take 40 mg by mouth daily. 11/14/22   [provider]  gabapentin (NEURONTIN) 100 MG capsule Take 100 mg by mouth 2 (two) times daily. 05/31/22   [provider]  metroNIDAZOLE (METROGEL) 1 % gel Apply 1 application topically at bedtime.     [provider]  Multiple Vitamin (MULTIVITAMIN WITH MINERALS) TABS tablet Take 1 tablet by mouth in the morning. Centrum Silver    [provider]  oxyCODONE (OXY IR/ROXICODONE) 5 MG immediate release tablet Take 0.5-1 tablets (2.5-5 mg total) by mouth every 4 (four) hours as needed for moderate pain (pain score 4-6). 06/03/23   Dedra Skeens, PA-C  potassium chloride (MICRO-K) 10 MEQ CR capsule Take 10 mEq by mouth in the morning.    [provider]  Sulfacetamide Sodium, Acne, 10 % LOTN Apply 1 Application topically 2 (two) times daily. 11/09/22   [provider]  vitamin B-12 (CYANOCOBALAMIN) 1000 MCG tablet Take 1,000 mcg by mouth in the morning.    [provider]     Physical Exam: Vitals:   08/21/23 1600 08/21/23 1620 08/21/23 1700 08/21/23 1800  BP: (!) 149/88  (!) 141/79 (!) 144/85  Pulse: (!) 105 95 91 97  Resp: (!) 26 (!) 22 20 (!) 22  Temp:      TempSrc:      SpO2: 96% 97% 97% 96%  Weight:      Height:       Physical Exam Vitals and nursing note reviewed.  Constitutional:      General: Terry Collins is not in acute distress.    Appearance: Terry Collins is normal weight. Terry Collins is not toxic-appearing.  HENT:     Head: Normocephalic and atraumatic.     Mouth/Throat:     Mouth: Mucous membranes are moist.     Pharynx: Oropharynx is clear.  Eyes:     Extraocular Movements: Extraocular movements intact.     Pupils: Pupils are equal, round, and reactive to light.  Neck:     Vascular: No hepatojugular reflux or JVD.  Cardiovascular:     Rate and Rhythm: Normal rate. Rhythm  irregular.     Heart sounds: No murmur heard. Pulmonary:     Effort: Pulmonary effort is normal. No tachypnea.     Breath sounds: Normal breath sounds.  Abdominal:     Palpations: Abdomen is soft.     Tenderness: There is no abdominal tenderness.  Musculoskeletal:     Comments: Trace bilateral pitting edema  Skin:    General: Skin is warm and dry.  Neurological:     General: No focal deficit present.     Mental Status: Terry Collins is alert and oriented to person, place, and time.     Comments: Minimal tremors  Psychiatric:        Mood and Affect: Mood normal.        Behavior: Behavior normal.    Data Reviewed: CBC with WBC of 9.2, hemoglobin of 10.9, MCV of 104.7, platelets of 259 CMP with sodium of 137, potassium 3.7, bicarb 26, glucose 136, BUN 19, creatinine 0.96, alkaline phosphatase 181, albumin 3.1 and GFR above 60 BNP 563 Troponin 27 Lactic acid 1.8 INR 1.6 Urinalysis with small hematuria ketonuria proteinuria  EKG personally reviewed.  Irregular rhythm consistent with known atrial fibrillation with rate of 111.  Singular PVC noted.  DG Chest Port 1 View Result Date:  08/21/2023 CLINICAL DATA:  [id number] Sepsis [facility]) [id number]. EXAM: PORTABLE CHEST 1 VIEW COMPARISON:  06/01/2023. FINDINGS: Redemonstration of heterogeneous nonspecific opacities throughout bilateral lungs, similar to the prior study favoring underlying chronic lung parenchymal changes. However, there are superimposed new, heterogeneous opacities overlying the right upper mid lung zones without volume loss, concerning for superimposed pneumonia. Follow-up to clearing is recommended. There are also probable small bilateral pleural layering pleural effusions. There is left retrocardiac opacity, which may be due to combination of left lung atelectasis/collapse with or without consolidation. Correlate clinically. Stable cardio-mediastinal silhouette. No acute osseous abnormalities. The soft tissues are within normal limits. IMPRESSION: *Chronic lung parenchymal changes with superimposed right upper/mid lung zone pneumonia and left retrocardiac opacity, as described above. Follow-up to clearing is recommended. Electronically Signed   By: Jules Schick M.D.   On: 08/21/2023 16:56   Results are pending, will review when available.  Assessment and Plan:  * Acute hypoxic respiratory failure (HCC) Patient is presenting to the ED with several day history of dry cough and shortness of breath, found to be notably hypoxic in the 70s-80s by EMS, now saturating at 97% on room air.  Given multifocal opacities seen on chest x-ray, differential includes community-acquired pneumonia versus HFpEF exacerbation (given elevated BNP).  - Telemetry monitoring - Continue supplemental oxygen to maintain oxygen saturation above 88% - Wean as tolerated - Continue azithromycin and ceftriaxone - Procalcitonin pending - If negative, will order full respiratory viral panel - CT of the chest ordered - Management of HFpEF as noted below  Acute on chronic heart failure with preserved ejection fraction (HFpEF) (HCC) Per chart review,  patient has a history of HFpEF with last EF in November 2024 that demonstrated EF of 50%, mild LVH and undeterminable diastolic function.  At that time, IVC was noted to be dilated with normal respiratory collapse though.  Previous echocardiogram obtained in November 2024, similar findings were noted, however IVC was dilated with abnormal respiratory collapse suggesting RAP of 15.  BNP is now elevated at 563, suggestive of acute exacerbation, however patient does not appear marked volume overloaded peripherally.   - Telemetry monitoring - One time dose of Lasix 20 mg IV - Daily weights - Strict in  and out - Daily magnesium and BMP  Atrial fibrillation with RVR (HCC) Brief episode of RVR on arrival, now with normalized ventricular rates.  - Continue home regimen  Hypotension Blood pressure elevated at this time  - Continue home midodrine  Megaloblastic anemia Chronic history of macrocytic anemia with baseline hemoglobin between 10-11.  Currently at baseline.  B12 last checked in 2021.  - Vitamin B12 pending  Parkinson disease (HCC) - Continue home regimen  Advance Care Planning:   Code Status: Limited: Do not attempt resuscitation (DNR) -DNR-LIMITED -Do Not Intubate/DNI  Verified by patient with son at bedside.   Consults: None  Family Communication: Patient's son updated at bedside.   Severity of Illness: The appropriate patient status for this patient is INPATIENT. Inpatient status is judged to be reasonable and necessary in order to provide the required intensity of service to ensure the patient's safety. The patient's presenting symptoms, physical exam findings, and initial radiographic and laboratory data in the context of their chronic comorbidities is felt to place them at high risk for further clinical deterioration. Furthermore, it is not anticipated that the patient will be medically stable for discharge from the hospital within 2 midnights of admission.   * I certify that  at the point of admission it is my clinical judgment that the patient will require inpatient hospital care spanning beyond 2 midnights from the point of admission due to high intensity of service, high risk for further deterioration and high frequency of surveillance required.*  Author: Verdene Lennert, MD 08/21/2023 7:30 PM  For on call review [email or web address].

## 2023-08-21 NOTE — ED Provider Notes (Signed)
[facility] Provider Note    Event Date/Time   First MD Initiated Contact with Patient 08/21/23 1525     (approximate)   History   Chief Complaint Shortness of Breath   HPI  Terry Collins is a 87 y.o. male with past medical history of atrial fibrillation on Pradaxa, anemia, and Parkinson's disease who presents to the ED complaining of shortness of breath.  Patient reports that he began having a dry cough with some weakness and malaise yesterday, has had increasing difficulty breathing since getting up today.  Per caregiver, he had a temperature as high as 100.0, but has not taken any Tylenol or ibuprofen today.  Patient denies any pain in his chest, has had mild swelling in his legs but no worse than usual.  Who has had frequent diarrhea over the past 24 hours, but denies any abdominal pain, nausea, vomiting, or dysuria.     Physical Exam   Triage Vital Signs: ED Triage Vitals  Encounter Vitals Group     BP 08/21/23 1525 (!) 147/87     Systolic BP Percentile --      Diastolic BP Percentile --      Pulse Rate 08/21/23 1515 (!) 108     Resp 08/21/23 1515 (!) 22     Temp 08/21/23 1515 97.8 F (36.6 C)     Temp Source 08/21/23 1515 Oral     SpO2 08/21/23 1515 97 %     Weight 08/21/23 1516 165 lb (74.8 kg)     Height 08/21/23 1516 6\' 2"  (1.88 m)     Head Circumference --      Peak Flow --      Pain Score 08/21/23 1516 0     Pain Loc --      Pain Education --      Exclude from Growth Chart --     Most recent vital signs: Vitals:   08/21/23 1600 08/21/23 1620  BP: (!) 149/88   Pulse: (!) 105 95  Resp: (!) 26 (!) 22  Temp:    SpO2: 96% 97%    Constitutional: Alert and oriented. Eyes: Conjunctivae are normal. Head: Atraumatic. Nose: No congestion/rhinnorhea. Mouth/Throat: Mucous membranes are moist.  Cardiovascular: Tachycardic, irregularly irregular rhythm. Grossly normal heart sounds.  2+ radial pulses bilaterally. Respiratory: Tachypneic  with mildly increased respiratory effort, crackles to bilateral bases. Gastrointestinal: Soft and nontender. No distention. Musculoskeletal: No lower extremity tenderness, trace pitting edema to knees bilaterally. Neurologic:  Normal speech and language. No gross focal neurologic deficits are appreciated.    ED Results / Procedures / Treatments   Labs (all labs ordered are listed, but only abnormal results are displayed) Labs Reviewed  COMPREHENSIVE METABOLIC PANEL - Abnormal; Notable for the following components:      Result Value   Glucose, Bld 136 (*)    Albumin 3.1 (*)    Alkaline Phosphatase 181 (*)    All other components within normal limits  CBC WITH DIFFERENTIAL/PLATELET - Abnormal; Notable for the following components:   RBC 3.22 (*)    Hemoglobin 10.9 (*)    HCT 33.7 (*)    MCV 104.7 (*)    All other components within normal limits  PROTIME-INR - Abnormal; Notable for the following components:   Prothrombin Time 19.3 (*)    INR 1.6 (*)    All other components within normal limits  BRAIN NATRIURETIC PEPTIDE - Abnormal; Notable for the following components:   B Natriuretic Peptide 563.5 (*)  All other components within normal limits  URINALYSIS, W/ REFLEX TO CULTURE (INFECTION SUSPECTED) - Abnormal; Notable for the following components:   Color, Urine AMBER (*)    APPearance HAZY (*)    Hgb urine dipstick SMALL (*)    Ketones, ur 5 (*)    Protein, ur 100 (*)    All other components within normal limits  TROPONIN I (HIGH SENSITIVITY) - Abnormal; Notable for the following components:   Troponin I (High Sensitivity) 27 (*)    All other components within normal limits  CULTURE, BLOOD (ROUTINE X 2)  CULTURE, BLOOD (ROUTINE X 2)  RESP PANEL BY RT-PCR (RSV, FLU A&B, COVID)  RVPGX2  LACTIC ACID, PLASMA  LACTIC ACID, PLASMA  PROCALCITONIN     EKG  ED ECG REPORT I, Chesley Noon, the attending physician, personally viewed and interpreted this ECG.   Date:  08/21/2023  EKG Time: 15:13  Rate: 111  Rhythm: atrial fibrillation  Axis: Normal  Intervals:right bundle branch block  ST&T Change: None  RADIOLOGY Chest x-ray reviewed and interpreted by me with right sided pneumonia, no effusion noted.  PROCEDURES:  Critical Care performed: Yes, see critical care procedure note(s)  .Critical Care  Performed by: Chesley Noon, MD Authorized by: Chesley Noon, MD   Critical care provider statement:    Critical care time (minutes):  30   Critical care time was exclusive of:  Separately billable procedures and treating other patients and teaching time   Critical care was necessary to treat or prevent imminent or life-threatening deterioration of the following conditions:  Respiratory failure   Critical care was time spent personally by me on the following activities:  Development of treatment plan with patient or surrogate, discussions with consultants, evaluation of patient's response to treatment, examination of patient, ordering and review of laboratory studies, ordering and review of radiographic studies, ordering and performing treatments and interventions, pulse oximetry, re-evaluation of patient's condition and review of old charts   I assumed direction of critical care for this patient from another provider in my specialty: no     Care discussed with: admitting provider      MEDICATIONS ORDERED IN ED: Medications  carbidopa-levodopa (SINEMET IR) 25-100 MG per tablet immediate release 1 tablet (has no administration in time range)  cefTRIAXone (ROCEPHIN) 2 g in sodium chloride 0.9 % 100 mL IVPB (has no administration in time range)  azithromycin (ZITHROMAX) 500 mg in sodium chloride 0.9 % 250 mL IVPB (has no administration in time range)     IMPRESSION / MDM / ASSESSMENT AND PLAN / ED COURSE  I reviewed the triage vital signs and the nursing notes.                              87 y.o. male with past medical history of atrial  fibrillation on Pradaxa, Parkinson's disease, and anemia presents to the ED with cough and increasing difficulty breathing over the past 24 hours.  Patient's presentation is most consistent with acute presentation with potential threat to life or bodily function.  Differential diagnosis includes, but is not limited to, sepsis, pneumonia, CHF, COPD, atrial fibrillation, ACS, PE, anemia, electrolyte abnormality, AKI.  Patient chronically ill-appearing and in mild respiratory distress, tachypneic with increased work of breathing and found to be hypoxic in triage.  Oxygen saturations now improved on 2 L nasal cannula, crackles noted to bilateral bases.  Sepsis workup initiated, chest x-ray results are pending at  this time.  He is in atrial fibrillation with mildly increased rate, no ischemic changes noted and I have low suspicion for ACS or PE given he denies chest pain.  Viral testing is pending at this time, labs with stable anemia and no significant leukocytosis, lactic acid within normal limits.  Chest x-ray concerning for right-sided pneumonia and patient meets sepsis criteria, will treat with IV Rocephin and azithromycin.  Viral testing is negative, additional labs without significant electrolyte abnormality or AKI, LFTs unremarkable.  BNP and troponin mildly elevated, would also consider component of CHF.  Case discussed with hospitalist for admission.      FINAL CLINICAL IMPRESSION(S) / ED DIAGNOSES   Final diagnoses:  Sepsis, due to unspecified organism, unspecified whether acute organ dysfunction present (HCC)  Acute respiratory failure with hypoxia (HCC)  Pneumonia due to infectious organism, unspecified laterality, unspecified part of lung     Rx / DC Orders   ED Discharge Orders     None        Note:  This document was prepared using Dragon voice recognition software and may include unintentional dictation errors.   Chesley Noon, MD 08/21/23 1726

## 2023-08-21 NOTE — Assessment & Plan Note (Signed)
-   Continue home regimen 

## 2023-08-22 DIAGNOSIS — J9601 Acute respiratory failure with hypoxia: Secondary | ICD-10-CM | POA: Diagnosis not present

## 2023-08-22 DIAGNOSIS — J189 Pneumonia, unspecified organism: Secondary | ICD-10-CM | POA: Diagnosis present

## 2023-08-22 LAB — CBC
HCT: 29.2 % — ABNORMAL LOW (ref 39.0–52.0)
Hemoglobin: 9.4 g/dL — ABNORMAL LOW (ref 13.0–17.0)
MCH: 33.1 pg (ref 26.0–34.0)
MCHC: 32.2 g/dL (ref 30.0–36.0)
MCV: 102.8 fL — ABNORMAL HIGH (ref 80.0–100.0)
Platelets: 221 10*3/uL (ref 150–400)
RBC: 2.84 MIL/uL — ABNORMAL LOW (ref 4.22–5.81)
RDW: 14.6 % (ref 11.5–15.5)
WBC: 7.4 10*3/uL (ref 4.0–10.5)
nRBC: 0 % (ref 0.0–0.2)

## 2023-08-22 LAB — RESPIRATORY PANEL BY PCR

## 2023-08-22 LAB — BASIC METABOLIC PANEL
Anion gap: 11 (ref 5–15)
BUN: 17 mg/dL (ref 8–23)
CO2: 24 mmol/L (ref 22–32)
Calcium: 8.4 mg/dL — ABNORMAL LOW (ref 8.9–10.3)
Chloride: 103 mmol/L (ref 98–111)
Creatinine, Ser: 0.86 mg/dL (ref 0.61–1.24)
GFR, Estimated: >60 mL/min (ref >60)
Glucose, Bld: 101 mg/dL — ABNORMAL HIGH (ref 70–99)
Potassium: 3.5 mmol/L (ref 3.5–5.1)
Sodium: 138 mmol/L (ref 135–145)

## 2023-08-22 LAB — STREP PNEUMONIAE URINARY ANTIGEN: Strep Pneumo Urinary Antigen: NEGATIVE

## 2023-08-22 LAB — MAGNESIUM: Magnesium: 1.9 mg/dL (ref 1.7–2.4)

## 2023-08-22 MED ORDER — AZITHROMYCIN 500 MG PO TABS: 500.0000 mg | ORAL_TABLET | Freq: Every day | ORAL | 0 refills | Status: AC

## 2023-08-22 MED ORDER — CEFDINIR 300 MG PO CAPS: 300.0000 mg | ORAL_CAPSULE | Freq: Two times a day (BID) | ORAL | 0 refills | Status: AC

## 2023-08-22 MED ORDER — ACETAMINOPHEN 325 MG PO TABS: 650.0000 mg | ORAL_TABLET | Freq: Four times a day (QID) | ORAL | Status: DC | PRN

## 2023-08-22 NOTE — Progress Notes (Signed)
SATURATION QUALIFICATIONS: (This note is used to comply with regulatory documentation for home oxygen)  Patient Saturations on Room Air at Rest = 86%  Patient Saturations on Room Air while Ambulating = 80%  Patient Saturations on 2 Liters of oxygen while Ambulating = 96%  Please briefly explain why patient needs home oxygen: Hypoxia with PNA

## 2023-08-22 NOTE — ED Notes (Signed)
ED TO INPATIENT HANDOFF REPORT  ED Nurse Name and Phone #: Blanche Gallien [phone]  S Name/Age/Gender Terry Collins 87 y.o. male Room/Bed: ED14A/ED14A  Code Status   Code Status: Limited: Do not attempt resuscitation (DNR) -DNR-LIMITED -Do Not Intubate/DNI   Home/SNF/Other Home Patient oriented to: self, place, time, and situation Is this baseline? Yes   Triage Complete: Triage complete  Chief Complaint Acute hypoxic respiratory failure (HCC) [J96.01]  Triage Note Pt to ED via [facility] from home for SOB and diarrhea since last night. EMS reports SPO2 70s-80s on RA. No hx COPD or asthma. Sats 85% RA with ems. Room air spo2 88% on arrival. Pt on 3L [place] SPO2 95%. Pt reports non-productive cough for unknown amount of days. Reports he came home from rehab facility for hip replacement 7 days ago. Pt a&o x4  HR 80-100 AFIB BP 161/92  Temp 98.8 F    Allergies Allergies  Allergen Reactions   Lactose Intolerance (Gi) Diarrhea    GI- upset   Tramadol Other (See Comments)    Confusion-hallucinations    Level of Care/Admitting Diagnosis ED Disposition     ED Disposition  Admit   Condition  --   Comment  Hospital Area: [facility] [[id number]]  Level of Care: Telemetry Medical [104]  Covid Evaluation: Symptomatic Person Under Investigation (PUI) or recent exposure (last 10 days) *Testing Required*  Diagnosis: Acute hypoxic respiratory failure [facility]) [[id number]]  Admitting Physician: Verdene Lennert [[id number]]  Attending Physician: Verdene Lennert [[id number]]  Certification:: I certify this patient will need inpatient services for at least 2 midnights  Expected Medical Readiness: 08/28/2023          B Medical/Surgery History Past Medical History:  Diagnosis Date   Anemia    Aortic valve insufficiency    Arthritis    hands   Atherosclerosis of aorta (HCC)    Carotid stenosis    History of kidney stones 2010   Mixed hyperlipidemia    Paroxysmal atrial fibrillation  (HCC)    Venous insufficiency of both lower extremities    Past Surgical History:  Procedure Laterality Date   CARDIAC ELECTROPHYSIOLOGY STUDY AND ABLATION  2019   [facility]   CARDIOVERSION N/A 07/07/2021   Procedure: CARDIOVERSION;  Surgeon: Lamar Blinks, MD;  Location: [facility] ORS;  Service: Cardiovascular;  Laterality: N/A;   cardiovesion     EYE SURGERY Bilateral 2012   cataracts   HERNIA REPAIR Left 2008   inguinal   HIP ARTHROPLASTY Left 06/02/2023   Procedure: ARTHROPLASTY BIPOLAR HIP (HEMIARTHROPLASTY);  Surgeon: Signa Kell, MD;  Location: [facility] ORS;  Service: Orthopedics;  Laterality: Left;   INGUINAL HERNIA REPAIR Right 06/24/2016   Procedure: HERNIA REPAIR INGUINAL ADULT;  Surgeon: Nadeen Landau, MD;  Location: [facility] ORS;  Service: General;  Laterality: Right;   TOTAL KNEE ARTHROPLASTY Right 07/16/2020   Procedure: RIGHT TOTAL KNEE ARTHROPLASTY;  Surgeon: Juanell Fairly, MD;  Location: [facility] ORS;  Service: Orthopedics;  Laterality: Right;   XI ROBOTIC ASSISTED INGUINAL HERNIA REPAIR WITH MESH Bilateral 01/20/2020   Procedure: XI ROBOTIC ASSISTED INGUINAL HERNIA REPAIR WITH MESH;  Surgeon: Carolan Shiver, MD;  Location: [facility] ORS;  Service: General;  Laterality: Bilateral;   XI ROBOTIC ASSISTED INGUINAL HERNIA REPAIR WITH MESH Right 12/16/2020   Procedure: XI ROBOTIC ASSISTED INGUINAL HERNIA REPAIR WITH MESH;  Surgeon: Carolan Shiver, MD;  Location: [facility] ORS;  Service: General;  Laterality: Right;     A IV Location/Drains/Wounds Patient Lines/Drains/Airways Status     Active  Line/Drains/Airways     Name Placement date Placement time Site Days   Peripheral IV 08/21/23 20 G Right Antecubital 08/21/23  1504  Antecubital  1   Peripheral IV 08/21/23 18 G Anterior;Right Forearm 08/21/23  1505  Forearm  1   Incision - 3 Ports Abdomen Umbilicus Right;Lateral Left;Lateral 01/20/20  1240  -- 1310   Incision - 3 Ports Abdomen 1: Left;Lateral 2: Right;Lateral 3:  Mid;Upper 12/16/20  1400  -- 979            Intake/Output Last 24 hours No intake or output data in the 24 hours ending 08/22/23 0715  Labs/Imaging Results for orders placed or performed during the hospital encounter of 08/21/23 (from the past 48 hours)  Respiratory (~20 pathogens) panel by PCR     Status: None   Collection Time: 08/21/23 12:13 AM   Specimen: Nasopharyngeal Swab; Respiratory  Result Value Ref Range   Adenovirus NOT DETECTED NOT DETECTED   Coronavirus 229E NOT DETECTED NOT DETECTED    Comment: (NOTE) The Coronavirus on the Respiratory Panel, DOES NOT test for the novel  Coronavirus (2019 nCoV)    Coronavirus HKU1 NOT DETECTED NOT DETECTED   Coronavirus NL63 NOT DETECTED NOT DETECTED   Coronavirus OC43 NOT DETECTED NOT DETECTED   Metapneumovirus NOT DETECTED NOT DETECTED   Rhinovirus / Enterovirus NOT DETECTED NOT DETECTED   Influenza A NOT DETECTED NOT DETECTED   Influenza B NOT DETECTED NOT DETECTED   Parainfluenza Virus 1 NOT DETECTED NOT DETECTED   Parainfluenza Virus 2 NOT DETECTED NOT DETECTED   Parainfluenza Virus 3 NOT DETECTED NOT DETECTED   Parainfluenza Virus 4 NOT DETECTED NOT DETECTED   Respiratory Syncytial Virus NOT DETECTED NOT DETECTED   Bordetella pertussis NOT DETECTED NOT DETECTED   Bordetella Parapertussis NOT DETECTED NOT DETECTED   Chlamydophila pneumoniae NOT DETECTED NOT DETECTED   Mycoplasma pneumoniae NOT DETECTED NOT DETECTED    Comment: Performed at [facility], [address] [place], [place] [address]  Comprehensive metabolic panel     Status: Abnormal   Collection Time: 08/21/23  3:23 PM  Result Value Ref Range   Sodium 137 135 - 145 mmol/L   Potassium 3.7 3.5 - 5.1 mmol/L   Chloride 101 98 - 111 mmol/L   CO2 26 22 - 32 mmol/L   Glucose, Bld 136 (H) 70 - 99 mg/dL    Comment: Glucose reference range applies only to samples taken after fasting for at least 8 hours.   BUN 19 8 - 23 mg/dL   Creatinine, Ser 8.65 0.61  - 1.24 mg/dL   Calcium 9.0 8.9 - 78.4 mg/dL   Total Protein 7.1 6.5 - 8.1 g/dL   Albumin 3.1 (L) 3.5 - 5.0 g/dL   AST 20 15 - 41 U/L   ALT 8 0 - 44 U/L   Alkaline Phosphatase 181 (H) 38 - 126 U/L   Total Bilirubin 1.1 <1.2 mg/dL   GFR, Estimated >69 >62 mL/min    Comment: (NOTE) Calculated using the CKD-EPI Creatinine Equation (2021)    Anion gap 10 5 - 15    Comment: Performed at [facility], [address] [place], [place] [address]  Lactic acid, plasma     Status: None   Collection Time: 08/21/23  3:23 PM  Result Value Ref Range   Lactic Acid, Venous 1.8 0.5 - 1.9 mmol/L    Comment: Performed at [facility], [address], [place], [place] [address]  CBC  with Differential     Status: Abnormal   Collection Time: 08/21/23  3:23 PM  Result Value Ref Range   WBC 9.2 4.0 - 10.5 K/uL   RBC 3.22 (L) 4.22 - 5.81 MIL/uL   Hemoglobin 10.9 (L) 13.0 - 17.0 g/dL   HCT 23.7 (L) 62.8 - 31.5 %   MCV 104.7 (H) 80.0 - 100.0 fL   MCH 33.9 26.0 - 34.0 pg   MCHC 32.3 30.0 - 36.0 g/dL   RDW 17.6 16.0 - 73.7 %   Platelets 259 150 - 400 K/uL   nRBC 0.0 0.0 - 0.2 %   Neutrophils Relative % 77 %   Neutro Abs 7.1 1.7 - 7.7 K/uL   Lymphocytes Relative 11 %   Lymphs Abs 1.0 0.7 - 4.0 K/uL   Monocytes Relative 10 %   Monocytes Absolute 1.0 0.1 - 1.0 K/uL   Eosinophils Relative 1 %   Eosinophils Absolute 0.1 0.0 - 0.5 K/uL   Basophils Relative 0 %   Basophils Absolute 0.0 0.0 - 0.1 K/uL   Immature Granulocytes 1 %   Abs Immature Granulocytes 0.05 0.00 - 0.07 K/uL    Comment: Performed at [facility], [address] [place], [place] [address]  Protime-INR     Status: Abnormal   Collection Time: 08/21/23  3:23 PM  Result Value Ref Range   Prothrombin Time 19.3 (H) 11.4 - 15.2 seconds   INR 1.6 (H) 0.8 - 1.2    Comment: (NOTE) INR goal varies based on device and disease states. Performed at [facility], [address] [place], [place] [address]    Culture, blood (Routine x 2)     Status: None (Preliminary result)   Collection Time: 08/21/23  3:23 PM   Specimen: BLOOD  Result Value Ref Range   Specimen Description BLOOD RIGHT ANTECUBITAL    Special Requests      BOTTLES DRAWN AEROBIC AND ANAEROBIC Blood Culture adequate volume   Culture      NO GROWTH < 24 HOURS Performed at [facility], [address] [place], [place] [address]    Report Status PENDING   Troponin I (High Sensitivity)     Status: Abnormal   Collection Time: 08/21/23  3:23 PM  Result Value Ref Range   Troponin I (High Sensitivity) 27 (H) <18 ng/L    Comment: (NOTE) Elevated high sensitivity troponin I (hsTnI) values and significant  changes across serial measurements may suggest ACS but many other  chronic and acute conditions are known to elevate hsTnI results.  Refer to the "Links" section for chest pain algorithms and additional  guidance. Performed at [facility], [address] [place], [place] [address]   Brain natriuretic peptide     Status: Abnormal   Collection Time: 08/21/23  3:23 PM  Result Value Ref Range   B Natriuretic Peptide 563.5 (H) 0.0 - 100.0 pg/mL    Comment: Performed at [facility], [address] [place], [place] [address]  Procalcitonin     Status: None   Collection Time: 08/21/23  3:23 PM  Result Value Ref Range   Procalcitonin <0.10 ng/mL    Comment:        Interpretation: PCT (Procalcitonin) <= 0.5 ng/mL: Systemic infection (sepsis) is not likely. Local bacterial infection is possible. (NOTE)       Sepsis PCT Algorithm           Lower Respiratory Tract  Infection PCT Algorithm    ----------------------------     ----------------------------         PCT < 0.25 ng/mL                PCT < 0.10 ng/mL          Strongly encourage             Strongly discourage   discontinuation of antibiotics    initiation of antibiotics    ----------------------------      -----------------------------       PCT 0.25 - 0.50 ng/mL            PCT 0.10 - 0.25 ng/mL               OR       >80% decrease in PCT            Discourage initiation of                                            antibiotics      Encourage discontinuation           of antibiotics    ----------------------------     -----------------------------         PCT >= 0.50 ng/mL              PCT 0.26 - 0.50 ng/mL               AND        <80% decrease in PCT             Encourage initiation of                                             antibiotics       Encourage continuation           of antibiotics    ----------------------------     -----------------------------        PCT >= 0.50 ng/mL                  PCT > 0.50 ng/mL               AND         increase in PCT                  Strongly encourage                                      initiation of antibiotics    Strongly encourage escalation           of antibiotics                                     -----------------------------                                           PCT <= 0.25 ng/mL  OR                                        > 80% decrease in PCT                                      Discontinue / Do not initiate                                             antibiotics  Performed at [facility], [address] [place], [place] [address]   Strep pneumoniae urinary antigen     Status: None   Collection Time: 08/21/23  3:23 PM  Result Value Ref Range   Strep Pneumo Urinary Antigen NEGATIVE NEGATIVE    Comment:        Infection due to S. pneumoniae cannot be absolutely ruled out since the antigen present may be below the detection limit of the test. Performed at [facility], [address], [place], [place] [address]   Culture, blood (Routine x 2)     Status: None (Preliminary result)   Collection Time: 08/21/23  4:51 PM   Specimen: BLOOD  Result Value Ref  Range   Specimen Description BLOOD LEFT ANTECUBITAL    Special Requests      BOTTLES DRAWN AEROBIC AND ANAEROBIC Blood Culture adequate volume   Culture      NO GROWTH < 24 HOURS Performed at [facility], [address] [place], [place] [address]    Report Status PENDING   Urinalysis, w/ Reflex to Culture (Infection Suspected) -     Status: Abnormal   Collection Time: 08/21/23  4:51 PM  Result Value Ref Range   Specimen Source URINE, RANDOM    Color, Urine AMBER (A) YELLOW    Comment: BIOCHEMICALS MAY BE AFFECTED BY COLOR   APPearance HAZY (A) CLEAR   Specific Gravity, Urine 1.027 1.005 - 1.030   pH 5.0 5.0 - 8.0   Glucose, UA NEGATIVE NEGATIVE mg/dL   Hgb urine dipstick SMALL (A) NEGATIVE   Bilirubin Urine NEGATIVE NEGATIVE   Ketones, ur 5 (A) NEGATIVE mg/dL   Protein, ur 914 (A) NEGATIVE mg/dL   Nitrite NEGATIVE NEGATIVE   Leukocytes,Ua NEGATIVE NEGATIVE   RBC / HPF 0-5 0 - 5 RBC/hpf   WBC, UA 0-5 0 - 5 WBC/hpf    Comment:        Reflex urine culture not performed if WBC <=10, OR if Squamous epithelial cells >5. If Squamous epithelial cells >5 suggest recollection.    Bacteria, UA NONE SEEN NONE SEEN   Squamous Epithelial / HPF 0-5 0 - 5 /HPF   Mucus PRESENT    Uric Acid Crys, UA PRESENT     Comment: Performed at [facility], [address] [place], [place] [address]  Resp panel by RT-PCR (RSV, Flu A&B, Covid) Anterior Nasal Swab     Status: None   Collection Time: 08/21/23  4:51 PM   Specimen: Anterior Nasal Swab  Result Value Ref Range   SARS Coronavirus 2 by RT PCR NEGATIVE NEGATIVE    Comment: (NOTE) SARS-CoV-2 target nucleic acids are NOT DETECTED.  The SARS-CoV-2 RNA  is generally detectable in upper respiratory specimens during the acute phase of infection. The lowest concentration of SARS-CoV-2 viral copies this assay can detect is 138 copies/mL. A negative result does not preclude SARS-Cov-2 infection and should not be used as the  sole basis for treatment or other patient management decisions. A negative result may occur with  improper specimen collection/handling, submission of specimen other than nasopharyngeal swab, presence of viral mutation(s) within the areas targeted by this assay, and inadequate number of viral copies(<138 copies/mL). A negative result must be combined with clinical observations, patient history, and epidemiological information. The expected result is Negative.  Fact Sheet for Patients:  BloggerCourse.com  Fact Sheet for Healthcare Providers:  SeriousBroker.it  This test is no t yet approved or cleared by the [place] FDA and  has been authorized for detection and/or diagnosis of SARS-CoV-2 by FDA under an Emergency Use Authorization (EUA). This EUA will remain  in effect (meaning this test can be used) for the duration of the COVID-19 declaration under Section 564(b)(1) of the Act, 21 U.S.C.section 360bbb-3(b)(1), unless the authorization is terminated  or revoked sooner.       Influenza A by PCR NEGATIVE NEGATIVE   Influenza B by PCR NEGATIVE NEGATIVE    Comment: (NOTE) The Xpert Xpress SARS-CoV-2/FLU/RSV plus assay is intended as an aid in the diagnosis of influenza from Nasopharyngeal swab specimens and should not be used as a sole basis for treatment. Nasal washings and aspirates are unacceptable for Xpert Xpress SARS-CoV-2/FLU/RSV testing.  Fact Sheet for Patients: BloggerCourse.com  Fact Sheet for Healthcare Providers: SeriousBroker.it  This test is not yet approved or cleared by the [place] FDA and has been authorized for detection and/or diagnosis of SARS-CoV-2 by FDA under an Emergency Use Authorization (EUA). This EUA will remain in effect (meaning this test can be used) for the duration of the COVID-19 declaration under Section 564(b)(1) of the Act, 21  U.S.C. section 360bbb-3(b)(1), unless the authorization is terminated or revoked.     Resp Syncytial Virus by PCR NEGATIVE NEGATIVE    Comment: (NOTE) Fact Sheet for Patients: BloggerCourse.com  Fact Sheet for Healthcare Providers: SeriousBroker.it  This test is not yet approved or cleared by the [place] FDA and has been authorized for detection and/or diagnosis of SARS-CoV-2 by FDA under an Emergency Use Authorization (EUA). This EUA will remain in effect (meaning this test can be used) for the duration of the COVID-19 declaration under Section 564(b)(1) of the Act, 21 U.S.C. section 360bbb-3(b)(1), unless the authorization is terminated or revoked.  Performed at [facility], [address] [place], [place] [address]   Lactic acid, plasma     Status: None   Collection Time: 08/21/23  6:59 PM  Result Value Ref Range   Lactic Acid, Venous 1.0 0.5 - 1.9 mmol/L    Comment: Performed at [facility], [address] [place], [place] [address]  Basic metabolic panel     Status: Abnormal   Collection Time: 08/22/23  5:34 AM  Result Value Ref Range   Sodium 138 135 - 145 mmol/L   Potassium 3.5 3.5 - 5.1 mmol/L   Chloride 103 98 - 111 mmol/L   CO2 24 22 - 32 mmol/L   Glucose, Bld 101 (H) 70 - 99 mg/dL    Comment: Glucose reference range applies only to samples taken after fasting for at least 8 hours.   BUN 17 8 - 23 mg/dL   Creatinine, Ser 1.91 0.61 -  1.24 mg/dL   Calcium 8.4 (L) 8.9 - 10.3 mg/dL   GFR, Estimated >62 >13 mL/min    Comment: (NOTE) Calculated using the CKD-EPI Creatinine Equation (2021)    Anion gap 11 5 - 15    Comment: Performed at [facility], [address] [place], [place] [address]  CBC     Status: Abnormal   Collection Time: 08/22/23  5:34 AM  Result Value Ref Range   WBC 7.4 4.0 - 10.5 K/uL   RBC 2.84 (L) 4.22 - 5.81 MIL/uL   Hemoglobin 9.4 (L) 13.0 - 17.0 g/dL    HCT 84.6 (L) 96.2 - 52.0 %   MCV 102.8 (H) 80.0 - 100.0 fL   MCH 33.1 26.0 - 34.0 pg   MCHC 32.2 30.0 - 36.0 g/dL   RDW 95.2 84.1 - 32.4 %   Platelets 221 150 - 400 K/uL   nRBC 0.0 0.0 - 0.2 %    Comment: Performed at [facility], [address], [place], [place] [address]  Magnesium     Status: None   Collection Time: 08/22/23  5:34 AM  Result Value Ref Range   Magnesium 1.9 1.7 - 2.4 mg/dL    Comment: Performed at [facility], [address] [place], [place] [address]   CT CHEST WO CONTRAST Result Date: 08/21/2023 CLINICAL DATA:  Shortness of breath and cough EXAM: CT CHEST WITHOUT CONTRAST TECHNIQUE: Multidetector CT imaging of the chest was performed following the standard protocol without IV contrast. RADIATION DOSE REDUCTION: This exam was performed according to the departmental dose-optimization program which includes automated exposure control, adjustment of the mA and/or kV according to patient size and/or use of iterative reconstruction technique. COMPARISON:  Same day chest radiograph FINDINGS: Cardiovascular: Mild multichamber cardiomegaly. No significant pericardial fluid/thickening. Ascending thoracic aorta measures 4.0 x 3.9 cm. Coronary artery calcifications and aortic atherosclerosis. Mediastinum/Nodes: Imaged thyroid gland without nodules meeting criteria for imaging follow-up by size. Normal esophagus. Mediastinal lymphadenopathy, for example 13 mm precarinal (2:72). Lungs/Pleura: The central airways are patent. Extensive geographic ground-glass densities associated with interlobular septal thickening noted in the bilateral, right-greater-than-left, upper lobes and to a lesser extent the lower lobes. Mild biapical bronchiectasis. No pneumothorax. Moderate right and small left pleural effusions. Upper abdomen: Unchanged 1.7 cm left adrenal nodule measures 13 HU, likely adenoma. No specific follow-up imaging recommended. Partially imaged bilateral renal  cysts. Musculoskeletal: Multilevel degenerative changes of the thoracic spine. Age indeterminate superior endplate compression deformities of T4, T7, T10. IMPRESSION: 1. Extensive geographic ground-glass densities associated with interlobular septal thickening in the bilateral, right-greater-than-left, upper lobes and to a lesser extent the lower lobes, likely pulmonary edema. 2. Moderate right and small left pleural effusions. 3. Mediastinal lymphadenopathy, likely reactive. 4. Age indeterminate superior endplate compression deformities of T4, T7, T10. 5. Ascending thoracic aorta measures 4.0 cm. Recommend annual imaging followup by CTA or MRA. This recommendation follows 2010 ACCF/AHA/AATS/ACR/ASA/SCA/SCAI/SIR/STS/SVM Guidelines for the Diagnosis and Management of Patients with Thoracic Aortic Disease. Circulation. 2010; 121: U440-H474. Aortic aneurysm NOS (ICD10-I71.9) 6. Aortic Atherosclerosis (ICD10-I70.0). Coronary artery calcifications. Assessment for potential risk factor modification, dietary therapy or pharmacologic therapy may be warranted, if clinically indicated. Electronically Signed   By: Agustin Cree M.D.   On: 08/21/2023 20:01   DG Chest Port 1 View Result Date: 08/21/2023 CLINICAL DATA:  [id number] Sepsis [facility]) [id number]. EXAM: PORTABLE CHEST 1 VIEW COMPARISON:  06/01/2023. FINDINGS: Redemonstration of heterogeneous nonspecific opacities throughout bilateral lungs, similar to the prior study favoring  underlying chronic lung parenchymal changes. However, there are superimposed new, heterogeneous opacities overlying the right upper mid lung zones without volume loss, concerning for superimposed pneumonia. Follow-up to clearing is recommended. There are also probable small bilateral pleural layering pleural effusions. There is left retrocardiac opacity, which may be due to combination of left lung atelectasis/collapse with or without consolidation. Correlate clinically. Stable cardio-mediastinal  silhouette. No acute osseous abnormalities. The soft tissues are within normal limits. IMPRESSION: *Chronic lung parenchymal changes with superimposed right upper/mid lung zone pneumonia and left retrocardiac opacity, as described above. Follow-up to clearing is recommended. Electronically Signed   By: Jules Schick M.D.   On: 08/21/2023 16:56    Pending Labs Unresulted Labs (From admission, onward)     Start     Ordered   08/21/23 1800  Legionella Pneumophila Serogp 1 Ur Ag  (COPD / Pneumonia / Cellulitis / Lower Extremity Wound)  Once,   R        08/21/23 1801            Vitals/Pain Today's Vitals   08/22/23 0600 08/22/23 0615 08/22/23 0700 08/22/23 0710  BP: 131/77  124/84   Pulse: 99 91 87   Resp: 20 19 20    Temp:    98.1 F (36.7 C)  TempSrc:      SpO2: 94% 92% 93%   Weight:      Height:      PainSc:        Isolation Precautions Droplet precaution  Medications Medications  cefTRIAXone (ROCEPHIN) 2 g in sodium chloride 0.9 % 100 mL IVPB (0 g Intravenous Stopped 08/21/23 1929)  azithromycin (ZITHROMAX) 500 mg in sodium chloride 0.9 % 250 mL IVPB (0 mg Intravenous Stopped 08/21/23 2123)  sodium chloride flush (NS) 0.9 % injection 3 mL (3 mLs Intravenous Given 08/21/23 2352)  ondansetron (ZOFRAN) tablet 4 mg (has no administration in time range)    Or  ondansetron (ZOFRAN) injection 4 mg (has no administration in time range)  acetaminophen (TYLENOL) tablet 650 mg (has no administration in time range)    Or  acetaminophen (TYLENOL) suppository 650 mg (has no administration in time range)  polyethylene glycol (MIRALAX / GLYCOLAX) packet 17 g (has no administration in time range)  carbidopa-levodopa (SINEMET IR) 25-100 MG per tablet immediate release 1 tablet (1 tablet Oral Given 08/22/23 0616)  cycloSPORINE (RESTASIS) 0.05 % ophthalmic emulsion 1 drop (0 drops Both Eyes Hold 08/21/23 2123)  dabigatran (PRADAXA) capsule 150 mg (150 mg Oral Given 08/21/23 2351)  ferrous  sulfate tablet 325 mg (325 mg Oral Patient Refused/Not Given 08/21/23 2350)  folic acid (FOLVITE) tablet 1 mg (1 mg Oral Given 08/22/23 0646)  gabapentin (NEURONTIN) capsule 100 mg (100 mg Oral Given 08/21/23 2350)  midodrine (PROAMATINE) tablet 5 mg (has no administration in time range)  cyanocobalamin (VITAMIN B12) tablet 1,000 mcg (1,000 mcg Oral Given 08/22/23 0646)  carbidopa-levodopa (SINEMET IR) 25-100 MG per tablet immediate release 1 tablet (1 tablet Oral Given 08/21/23 1808)  furosemide (LASIX) injection 20 mg (20 mg Intravenous Given 08/21/23 1925)    Mobility manual wheelchair     Focused Assessments Pulmonary Assessment Handoff:  Lung sounds: Bilateral Breath Sounds: Diminished L Breath Sounds: Diminished R Breath Sounds: Diminished O2 Device: Nasal Cannula O2 Flow Rate (L/min): 2 L/min    R Recommendations: See Admitting Provider Note  Report given to:   Additional Notes:

## 2023-08-22 NOTE — Progress Notes (Signed)
Patient OOF via EMS in stable condition.  °

## 2023-08-22 NOTE — Progress Notes (Signed)
Patient awaiting EMS transport for discharge. Daughter and caregiver remain at bedside.

## 2023-08-22 NOTE — Care Management Obs Status (Signed)
MEDICARE OBSERVATION STATUS NOTIFICATION   Patient Details  Name: Terry Collins MRN: [id number] Date of Birth: 1936-04-18   Medicare Observation Status Notification Given:  Yes    Teria Khachatryan, LCSW 08/22/2023, 2:54 PM

## 2023-08-22 NOTE — Discharge Summary (Incomplete)
Physician Discharge Summary   Patient: Terry Collins MRN: [id number] DOB: 04-02-1936  Admit date:     08/21/2023  Discharge date: 08/22/2023  Discharge Physician: Pennie Banter   PCP: Barbette Reichmann, MD   Recommendations at discharge:  {Tip this will not be part of the note when signed- Example include specific recommendations for outpatient follow-up, pending tests to follow-up on. (Optional):26781}  ***  Discharge Diagnoses: Principal Problem:   Acute hypoxic respiratory failure (HCC) Active Problems:   Acute on chronic heart failure with preserved ejection fraction (HFpEF) (HCC)   Atrial fibrillation with RVR (HCC)   Hypotension   Parkinson disease (HCC)   Megaloblastic anemia   CAP (community acquired pneumonia)  Resolved Problems:   * No resolved hospital problems. *  Hospital Course: No notes on file  Assessment and Plan: * Acute hypoxic respiratory failure (HCC) Multifocal pneumonia Presented with several day history of dry cough and shortness of breath, found hypoxic in the 70s-80s by EMS.  CXR showed multifocal opacities differential includes community-acquired pneumonia versus HFpEF exacerbation (given elevated BNP). Negative for Covid, Flu A/B and RSV. Full respiratory viral panel negative. - Supplemental oxygen to target spO2 > 88%, wean as tolerated - Continue azithromycin and ceftriaxone - Management of HFpEF as outlined --Pulmonary hygiene with IS and flutter  Acute on chronic heart failure with preserved ejection fraction (HFpEF) (HCC) Last echo November 2024 - EF 50%, mild LVH, undeterminable diastolic function.   BNP elevated at 563 on admission, suggestive of acute decompensation, however clinically appears euvolemic. - Telemetry monitoring - Given single dose Lasix 20 mg IV - Daily weights - Strict in and out - Monitor renal function, electrolytes  Atrial fibrillation with RVR (HCC) Brief episode of RVR on arrival, now with normalized  ventricular rates. - Continue home Pradaxa -- Telemetry  Hypotension Chronic, likely due to Parkinson's - Continue home midodrine --Maintain MAP > 65  Megaloblastic anemia Chronic history of macrocytic anemia with baseline hemoglobin between 10-11.  Currently at baseline.  B12 last checked in 2021. - Vitamin B12 pending  Parkinson disease (HCC) - Continue home Sinemet      {Tip this will not be part of the note when signed Body mass index is 21.18 kg/m. , ,  (Optional):26781}  {(NOTE) Pain control PDMP Statment (Optional):26782} Consultants: *** Procedures performed: ***  Disposition: {Plan; Disposition:26390} Diet recommendation:  Discharge Diet Orders (From admission, onward)     Start     Ordered   08/22/23 0000  Diet - low sodium heart healthy        08/22/23 1446           {Diet_Plan:26776} DISCHARGE MEDICATION: Allergies as of 08/22/2023       Reactions   Lactose Intolerance (gi) Diarrhea   GI- upset   Tramadol Other (See Comments)   Confusion-hallucinations        Medication List     STOP taking these medications    oxyCODONE 5 MG immediate release tablet Commonly known as: Oxy IR/ROXICODONE   torsemide 20 MG tablet Commonly known as: DEMADEX       TAKE these medications    acetaminophen 325 MG tablet Commonly known as: TYLENOL Take 2 tablets (650 mg total) by mouth every 6 (six) hours as needed for mild pain (pain score 1-3) (or Fever >/= 101).   azithromycin 500 MG tablet Commonly known as: Zithromax Take 1 tablet (500 mg total) by mouth daily for 5 days.   carbidopa-levodopa 25-100 MG tablet Commonly  known as: SINEMET IR Take 1-2 tablets by mouth 5 (five) times daily. Takes two tablets every morning and one tablet rest of day   cefdinir 300 MG capsule Commonly known as: OMNICEF Take 1 capsule (300 mg total) by mouth 2 (two) times daily for 5 days.   cyanocobalamin 1000 MCG tablet Commonly known as: VITAMIN B12 Take 1,000  mcg by mouth in the morning.   cycloSPORINE 0.05 % ophthalmic emulsion Commonly known as: RESTASIS Place 1 drop into both eyes 2 (two) times daily.   dabigatran 150 MG Caps capsule Commonly known as: PRADAXA Take 150 mg by mouth 2 (two) times daily.   ferrous sulfate 325 (65 FE) MG tablet Take 325 mg by mouth at bedtime.   FIBER-CAPS PO Take 3 capsules by mouth in the morning.   folic acid 800 MCG tablet Commonly known as: FOLVITE Take 800 mcg by mouth in the morning.   furosemide 40 MG tablet Commonly known as: LASIX Take 40 mg by mouth daily.   gabapentin 100 MG capsule Commonly known as: NEURONTIN Take 100 mg by mouth 2 (two) times daily.   metroNIDAZOLE 1 % gel Commonly known as: METROGEL Apply 1 application topically at bedtime.   midodrine 5 MG tablet Commonly known as: PROAMATINE Take 5 mg by mouth 3 (three) times daily.   multivitamin with minerals Tabs tablet Take 1 tablet by mouth in the morning. Centrum Silver   potassium chloride 10 MEQ CR capsule Commonly known as: MICRO-K Take 10 mEq by mouth in the morning.   Sulfacetamide Sodium (Acne) 10 % Lotn Apply 1 Application topically 2 (two) times daily.               Durable Medical Equipment  (From admission, onward)           Start     Ordered   08/22/23 1425  For home use only DME oxygen  Once       Question Answer Comment  Length of Need 6 Months   Mode or (Route) Nasal cannula   Liters per Minute 2   Frequency Continuous (stationary and portable oxygen unit needed)   Oxygen delivery system Gas      08/22/23 1424            Discharge Exam: Filed Weights   08/21/23 1516  Weight: 74.8 kg   ***  Condition at discharge: {DC Condition:26389}  The results of significant diagnostics from this hospitalization (including imaging, microbiology, ancillary and laboratory) are listed below for reference.   Imaging Studies: CT CHEST WO CONTRAST Result Date: 08/21/2023 CLINICAL  DATA:  Shortness of breath and cough EXAM: CT CHEST WITHOUT CONTRAST TECHNIQUE: Multidetector CT imaging of the chest was performed following the standard protocol without IV contrast. RADIATION DOSE REDUCTION: This exam was performed according to the departmental dose-optimization program which includes automated exposure control, adjustment of the mA and/or kV according to patient size and/or use of iterative reconstruction technique. COMPARISON:  Same day chest radiograph FINDINGS: Cardiovascular: Mild multichamber cardiomegaly. No significant pericardial fluid/thickening. Ascending thoracic aorta measures 4.0 x 3.9 cm. Coronary artery calcifications and aortic atherosclerosis. Mediastinum/Nodes: Imaged thyroid gland without nodules meeting criteria for imaging follow-up by size. Normal esophagus. Mediastinal lymphadenopathy, for example 13 mm precarinal (2:72). Lungs/Pleura: The central airways are patent. Extensive geographic ground-glass densities associated with interlobular septal thickening noted in the bilateral, right-greater-than-left, upper lobes and to a lesser extent the lower lobes. Mild biapical bronchiectasis. No pneumothorax. Moderate right and small left  pleural effusions. Upper abdomen: Unchanged 1.7 cm left adrenal nodule measures 13 HU, likely adenoma. No specific follow-up imaging recommended. Partially imaged bilateral renal cysts. Musculoskeletal: Multilevel degenerative changes of the thoracic spine. Age indeterminate superior endplate compression deformities of T4, T7, T10. IMPRESSION: 1. Extensive geographic ground-glass densities associated with interlobular septal thickening in the bilateral, right-greater-than-left, upper lobes and to a lesser extent the lower lobes, likely pulmonary edema. 2. Moderate right and small left pleural effusions. 3. Mediastinal lymphadenopathy, likely reactive. 4. Age indeterminate superior endplate compression deformities of T4, T7, T10. 5. Ascending  thoracic aorta measures 4.0 cm. Recommend annual imaging followup by CTA or MRA. This recommendation follows 2010 ACCF/AHA/AATS/ACR/ASA/SCA/SCAI/SIR/STS/SVM Guidelines for the Diagnosis and Management of Patients with Thoracic Aortic Disease. Circulation. 2010; 121: V564-P329. Aortic aneurysm NOS (ICD10-I71.9) 6. Aortic Atherosclerosis (ICD10-I70.0). Coronary artery calcifications. Assessment for potential risk factor modification, dietary therapy or pharmacologic therapy may be warranted, if clinically indicated. Electronically Signed   By: Agustin Cree M.D.   On: 08/21/2023 20:01   DG Chest Port 1 View Result Date: 08/21/2023 CLINICAL DATA:  [id number] Sepsis [facility]) [id number]. EXAM: PORTABLE CHEST 1 VIEW COMPARISON:  06/01/2023. FINDINGS: Redemonstration of heterogeneous nonspecific opacities throughout bilateral lungs, similar to the prior study favoring underlying chronic lung parenchymal changes. However, there are superimposed new, heterogeneous opacities overlying the right upper mid lung zones without volume loss, concerning for superimposed pneumonia. Follow-up to clearing is recommended. There are also probable small bilateral pleural layering pleural effusions. There is left retrocardiac opacity, which may be due to combination of left lung atelectasis/collapse with or without consolidation. Correlate clinically. Stable cardio-mediastinal silhouette. No acute osseous abnormalities. The soft tissues are within normal limits. IMPRESSION: *Chronic lung parenchymal changes with superimposed right upper/mid lung zone pneumonia and left retrocardiac opacity, as described above. Follow-up to clearing is recommended. Electronically Signed   By: Jules Schick M.D.   On: 08/21/2023 16:56    Microbiology: Results for orders placed or performed during the hospital encounter of 08/21/23  Respiratory (~20 pathogens) panel by PCR     Status: None   Collection Time: 08/21/23 12:13 AM   Specimen: Nasopharyngeal Swab;  Respiratory  Result Value Ref Range Status   Adenovirus NOT DETECTED NOT DETECTED Final   Coronavirus 229E NOT DETECTED NOT DETECTED Final    Comment: (NOTE) The Coronavirus on the Respiratory Panel, DOES NOT test for the novel  Coronavirus (2019 nCoV)    Coronavirus HKU1 NOT DETECTED NOT DETECTED Final   Coronavirus NL63 NOT DETECTED NOT DETECTED Final   Coronavirus OC43 NOT DETECTED NOT DETECTED Final   Metapneumovirus NOT DETECTED NOT DETECTED Final   Rhinovirus / Enterovirus NOT DETECTED NOT DETECTED Final   Influenza A NOT DETECTED NOT DETECTED Final   Influenza B NOT DETECTED NOT DETECTED Final   Parainfluenza Virus 1 NOT DETECTED NOT DETECTED Final   Parainfluenza Virus 2 NOT DETECTED NOT DETECTED Final   Parainfluenza Virus 3 NOT DETECTED NOT DETECTED Final   Parainfluenza Virus 4 NOT DETECTED NOT DETECTED Final   Respiratory Syncytial Virus NOT DETECTED NOT DETECTED Final   Bordetella pertussis NOT DETECTED NOT DETECTED Final   Bordetella Parapertussis NOT DETECTED NOT DETECTED Final   Chlamydophila pneumoniae NOT DETECTED NOT DETECTED Final   Mycoplasma pneumoniae NOT DETECTED NOT DETECTED Final    Comment: Performed at [facility], [address], [place], [place] [address]  Culture, blood (Routine x 2)     Status: None (Preliminary result)   Collection Time:  08/21/23  3:23 PM   Specimen: BLOOD  Result Value Ref Range Status   Specimen Description BLOOD RIGHT ANTECUBITAL  Final   Special Requests   Final    BOTTLES DRAWN AEROBIC AND ANAEROBIC Blood Culture adequate volume   Culture   Final    NO GROWTH < 24 HOURS Performed at [facility], [address] [place], [place] [address]    Report Status PENDING  Incomplete  Culture, blood (Routine x 2)     Status: None (Preliminary result)   Collection Time: 08/21/23  4:51 PM   Specimen: BLOOD  Result Value Ref Range Status   Specimen Description BLOOD LEFT ANTECUBITAL  Final   Special Requests   Final     BOTTLES DRAWN AEROBIC AND ANAEROBIC Blood Culture adequate volume   Culture   Final    NO GROWTH < 24 HOURS Performed at [facility], [address] [place], [place] [address]    Report Status PENDING  Incomplete  Resp panel by RT-PCR (RSV, Flu A&B, Covid) Anterior Nasal Swab     Status: None   Collection Time: 08/21/23  4:51 PM   Specimen: Anterior Nasal Swab  Result Value Ref Range Status   SARS Coronavirus 2 by RT PCR NEGATIVE NEGATIVE Final    Comment: (NOTE) SARS-CoV-2 target nucleic acids are NOT DETECTED.  The SARS-CoV-2 RNA is generally detectable in upper respiratory specimens during the acute phase of infection. The lowest concentration of SARS-CoV-2 viral copies this assay can detect is 138 copies/mL. A negative result does not preclude SARS-Cov-2 infection and should not be used as the sole basis for treatment or other patient management decisions. A negative result may occur with  improper specimen collection/handling, submission of specimen other than nasopharyngeal swab, presence of viral mutation(s) within the areas targeted by this assay, and inadequate number of viral copies(<138 copies/mL). A negative result must be combined with clinical observations, patient history, and epidemiological information. The expected result is Negative.  Fact Sheet for Patients:  BloggerCourse.com  Fact Sheet for Healthcare Providers:  SeriousBroker.it  This test is no t yet approved or cleared by the [place] FDA and  has been authorized for detection and/or diagnosis of SARS-CoV-2 by FDA under an Emergency Use Authorization (EUA). This EUA will remain  in effect (meaning this test can be used) for the duration of the COVID-19 declaration under Section 564(b)(1) of the Act, 21 U.S.C.section 360bbb-3(b)(1), unless the authorization is terminated  or revoked sooner.       Influenza A by PCR NEGATIVE  NEGATIVE Final   Influenza B by PCR NEGATIVE NEGATIVE Final    Comment: (NOTE) The Xpert Xpress SARS-CoV-2/FLU/RSV plus assay is intended as an aid in the diagnosis of influenza from Nasopharyngeal swab specimens and should not be used as a sole basis for treatment. Nasal washings and aspirates are unacceptable for Xpert Xpress SARS-CoV-2/FLU/RSV testing.  Fact Sheet for Patients: BloggerCourse.com  Fact Sheet for Healthcare Providers: SeriousBroker.it  This test is not yet approved or cleared by the [place] FDA and has been authorized for detection and/or diagnosis of SARS-CoV-2 by FDA under an Emergency Use Authorization (EUA). This EUA will remain in effect (meaning this test can be used) for the duration of the COVID-19 declaration under Section 564(b)(1) of the Act, 21 U.S.C. section 360bbb-3(b)(1), unless the authorization is terminated or revoked.     Resp Syncytial Virus by PCR NEGATIVE NEGATIVE Final    Comment: (NOTE) Fact Sheet for Patients:  BloggerCourse.com  Fact Sheet for Healthcare Providers: SeriousBroker.it  This test is not yet approved or cleared by the [place] FDA and has been authorized for detection and/or diagnosis of SARS-CoV-2 by FDA under an Emergency Use Authorization (EUA). This EUA will remain in effect (meaning this test can be used) for the duration of the COVID-19 declaration under Section 564(b)(1) of the Act, 21 U.S.C. section 360bbb-3(b)(1), unless the authorization is terminated or revoked.  Performed at [facility], [address] [place], [place] [address]     Labs: CBC: Recent Labs  Lab 08/21/23 1523 08/22/23 0534  WBC 9.2 7.4  NEUTROABS 7.1  --   HGB 10.9* 9.4*  HCT 33.7* 29.2*  MCV 104.7* 102.8*  PLT 259 221   Basic Metabolic Panel: Recent Labs  Lab 08/21/23 1523 08/22/23 0534  NA 137 138  K  3.7 3.5  CL 101 103  CO2 26 24  GLUCOSE 136* 101*  BUN 19 17  CREATININE 0.96 0.86  CALCIUM 9.0 8.4*  MG  --  1.9   Liver Function Tests: Recent Labs  Lab 08/21/23 1523  AST 20  ALT 8  ALKPHOS 181*  BILITOT 1.1  PROT 7.1  ALBUMIN 3.1*   CBG: No results for input(s): "GLUCAP" in the last 168 hours.  Discharge time spent: {LESS THAN/GREATER JXBJ:47829} 30 minutes.  Signed: Pennie Banter, DO [facility] 08/22/2023

## 2023-08-22 NOTE — Progress Notes (Signed)
Progress Note   Patient: Terry Collins BMW:[id number] DOB: Nov 12, 1935 DOA: 08/21/2023     1 DOS: the patient was seen and examined on 08/22/2023   Brief hospital course: "Terry Collins is a 87 y.o. male with medical history significant of persistent atrial fibrillation s/p cardioversion (2022) on Pradaxa, Parkinson's disease, HFpEF, moderate AR, hyperlipidemia, chronic anemia, who presents to the ED due to shortness of breath.   Terry Collins states he has been experiencing a dry cough for approximately 1 day and then developed shortness of breathe today. He also began to experience non-bloody, non-melanotic diarrhea. He had a one time low grade fever of 100.0.Marland KitchenMarland KitchenMarland Kitchen" See H&P for full HPI on admission & ED course.  Pt was found to have pneumonia complicated by acute respiratory failure with hypoxia and was admitted to the hospital for IV antibiotics.    Further hospital course and management as outlined below.   Assessment and Plan: * Acute hypoxic respiratory failure (HCC) Multifocal pneumonia Presented with several day history of dry cough and shortness of breath, found hypoxic in the 70s-80s by EMS.  CXR showed multifocal opacities differential includes community-acquired pneumonia versus HFpEF exacerbation (given elevated BNP). Negative for Covid, Flu A/B and RSV. Full respiratory viral panel negative. - Supplemental oxygen to target spO2 > 88%, wean as tolerated - Continue azithromycin and ceftriaxone - Management of HFpEF as outlined --Pulmonary hygiene with IS and flutter  Acute on chronic heart failure with preserved ejection fraction (HFpEF) (HCC) Last echo November 2024 - EF 50%, mild LVH, undeterminable diastolic function.   BNP elevated at 563 on admission, suggestive of acute decompensation, however clinically appears euvolemic. - Telemetry monitoring - Given single dose Lasix 20 mg IV - Daily weights - Strict in and out - Monitor renal function, electrolytes  Atrial  fibrillation with RVR (HCC) Brief episode of RVR on arrival, now with normalized ventricular rates. - Continue home Pradaxa -- Telemetry  Hypotension Chronic, likely due to Parkinson's - Continue home midodrine --Maintain MAP > 65  Megaloblastic anemia Chronic history of macrocytic anemia with baseline hemoglobin between 10-11.  Currently at baseline.  B12 last checked in 2021. - Vitamin B12 pending  Parkinson disease (HCC) - Continue home Sinemet        Subjective: Pt seen in the ED with son and caregiver at bedside this AM.  He reports feeling somewhat better since admission.  Cough is dry. Denies fever/chills or chest pain.    Physical Exam: Vitals:   08/22/23 0710 08/22/23 0800 08/22/23 0858 08/22/23 1149  BP:  116/68 119/72 135/89  Pulse:  98 89 89  Resp:  19 16 16   Temp: 98.1 F (36.7 C)  97.8 F (36.6 C) 98 F (36.7 C)  TempSrc:      SpO2:  95% 95% 96%  Weight:      Height:       General exam: awake, alert, no acute distress HEENT: moist mucus membranes, hearing grossly normal  Respiratory system: CTAB, no wheezes, rales or rhonchi, normal respiratory effort. On 2 L/min  o2 Cardiovascular system: normal S1/S2, tachycardic, no pedal edema.   Gastrointestinal system: soft, NT, ND, no HSM felt, +bowel sounds. Central nervous system: A&O x 2+. no gross focal neurologic deficits, normal speech Extremities: moves all, no edema, normal tone Skin: dry, intact, normal temperature Psychiatry: normal mood, congruent affect   Data Reviewed:  Notable labs --   glucose 101, Ca 8.4, Hbg 9.4 Lactic acid trend 1.8 >> 1.0 Procal < 0.10  Family  Communication: at bedside on rounds  Disposition: Status is: Inpatient Remains inpatient appropriate because: still requiring oxygen, remains on IV antibiotics pending further clinical improvement   Planned Discharge Destination: Home    Time spent: 42 minutes  Author: Pennie Banter, DO 08/22/2023 1:04 PM  For  on call review [email or web address].

## 2023-08-22 NOTE — TOC Transition Note (Addendum)
Transition of Care [facility]) - Discharge Note   Patient Details  Name: Terry Collins MRN: [id number] Date of Birth: [birth year]  [facility] CM/SW Contact:  Allena Katz, LCSW Phone Number: 08/22/2023, 2:59 PM   Clinical Narrative:   Pt to discharge back to [facility] with 24/7 care and oxygen through [employer]. Once oxygen has been delivered CSW to call for ems transport.    3:04pm Mitch states they can have the oxygen there in the next few minutes for patient. Spoke with patients daughter and her brother is t the house now to let the oxygen company in. Good to call ems. Patient is 8th on the list.  Final next level of care: Home/Self Care Barriers to Discharge: Barriers Resolved   Patient Goals and CMS Choice            Discharge Placement                Patient to be transferred to facility by: [facility] Name of family member notified: daughter christine Patient and family notified of of transfer: 08/22/23  Discharge Plan and Services Additional resources added to the After Visit Summary for                  DME Arranged: Oxygen DME Agency: [employer] Date DME Agency Contacted: 08/22/23   Representative spoke with at DME Agency: mitch            Social Drivers of Health (SDOH) Interventions SDOH Screenings   Food Insecurity: No Food Insecurity (08/22/2023)  Housing: Low Risk  (08/22/2023)  Transportation Needs: No Transportation Needs (08/22/2023)  Utilities: Not At Risk (08/22/2023)  Financial Resource Strain: Low Risk  (05/22/2023)   Received from [facility]  Physical Activity: Insufficiently Active (12/21/2020)   Received from [facility]  Social Connections: Unknown (12/21/2020)   Received [facility]  Stress: No Stress Concern Present (12/21/2020)   Received from [facility]  Tobacco Use: Low Risk  (08/21/2023)     Readmission Risk Interventions     No  data to display

## 2023-08-22 NOTE — Progress Notes (Signed)
Patient awaiting home oxygen delivery and EMS transport. Daughter and care giver at bedside

## 2023-08-22 NOTE — ED Notes (Signed)
Pt transferred onto hospital bed for comfort. Toileting provided. Unable to send urine sample d/t contamination from BM. Pericare provided. Pt dressed into hospital gown, provided with warm blanket. Reconnected to VS monitor. Family remains at bedside. Pt/caregiver denies any additional needs. Call bell within reach

## 2023-08-22 NOTE — TOC Initial Note (Signed)
Transition of Care [facility]) - Initial/Assessment Note    Patient Details  Name: Terry Collins MRN: [id number] Date of Birth: [birth year]  Transition of Care [facility]) CM/SW Contact:    Allena Katz, LCSW Phone Number: 08/22/2023, 2:30 PM  Clinical Narrative:   Pt admitted from [facility]. Pt has 24/7 caregivers at facility. Pt now qualifying for oxygen and can go once he gets this. CSW has ordered oxygen through mitch at [employer]. CSW has also left a VM with kimberly at [facility] to make sure pt is able to come back today.               Expected Discharge Plan: Assisted Living Barriers to Discharge: Continued Medical Work up   Patient Goals and CMS Choice            Expected Discharge Plan and Services                         DME Arranged: Oxygen DME Agency: [employer] Date DME Agency Contacted: 08/22/23   Representative spoke with at DME Agency: [facility]            Prior Living Arrangements/Services   Lives with:: Facility Resident          Need for Family Participation in Patient Care: Yes (Comment) Care giver support system in place?: Yes (comment) Current home services: DME    Activities of Daily Living   ADL Screening (condition at time of admission) Independently performs ADLs?: No Does the patient have a NEW difficulty with bathing/dressing/toileting/self-feeding that is expected to last >3 days?: No Does the patient have a NEW difficulty with getting in/out of bed, walking, or climbing stairs that is expected to last >3 days?: No Does the patient have a NEW difficulty with communication that is expected to last >3 days?: No Is the patient deaf or have difficulty hearing?: No Does the patient have difficulty seeing, even when wearing glasses/contacts?: No Does the patient have difficulty concentrating, remembering, or making decisions?: No  Permission Sought/Granted                  Emotional Assessment        Orientation: : Fluctuating Orientation (Suspected and/or reported Sundowners)   Psych Involvement: No (comment)  Admission diagnosis:  Acute respiratory failure with hypoxia (HCC) [J96.01] Pneumonia due to infectious organism, unspecified laterality, unspecified part of lung [J18.9] Sepsis, due to unspecified organism, unspecified whether acute organ dysfunction present (HCC) [A41.9] Acute hypoxic respiratory failure (HCC) [J96.01] Patient Active Problem List   Diagnosis Date Noted   Acute hypoxic respiratory failure (HCC) 08/21/2023   Hypotension 08/21/2023   Age-related osteoporosis with current pathological fracture with routine healing 06/06/2023   Closed left hip fracture (HCC) 06/01/2023   Atrial fibrillation with RVR (HCC) 06/01/2023   Parkinson disease (HCC) 06/01/2023   Hip fracture (HCC) 06/01/2023   Spinal stenosis of lumbar region 09/08/2021   Incarcerated inguinal hernia, unilateral 12/15/2020   Paroxysmal atrial fibrillation (HCC)    Leukocytosis    Acute metabolic encephalopathy    Normocytic anemia    S/P TKR (total knee replacement) using cement, right    Total knee replacement status, right 07/16/2020   Atherosclerosis of aorta (HCC) 04/30/2020   Acute on chronic heart failure with preserved ejection fraction (HFpEF) (HCC) 10/10/2019   Moderate aortic valve insufficiency 10/04/2018   Venous insufficiency of both lower extremities 09/04/2018   Situational depression 08/14/2018   RBBB (right  bundle branch block with left anterior fascicular block) 08/31/2017   Hematuria, microscopic 07/26/2017   Mobitz type 1 second degree atrioventricular block 10/28/2015   Megaloblastic anemia 10/20/2015   Erectile dysfunction 07/21/2015   Pure hypercholesterolemia 07/21/2015   Carotid artery disease (HCC) 04/29/2015   Moderate mitral insufficiency 01/22/2015   BPH without urinary obstruction 07/16/2014   PCP:  Barbette Reichmann, MD Pharmacy:   [id number]  [place], [place] - [address] AT [facility] [address] [place] [address] Phone: [phone] Fax: [phone]     Social Drivers of Health (SDOH) Social History: SDOH Screenings   Food Insecurity: No Food Insecurity (08/22/2023)  Housing: Low Risk  (08/22/2023)  Transportation Needs: No Transportation Needs (08/22/2023)  Utilities: Not At Risk (08/22/2023)  Financial Resource Strain: Low Risk  (05/22/2023)   Received from [facility]  Physical Activity: Insufficiently Active (12/21/2020)   Received from [facility]  Social Connections: Unknown (12/21/2020)   Received from [facility]  Stress: No Stress Concern Present (12/21/2020)   Received from [facility]  Tobacco Use: Low Risk  (08/21/2023)   SDOH Interventions:     Readmission Risk Interventions     No data to display

## 2023-08-25 LAB — LEGIONELLA PNEUMOPHILA SEROGP 1 UR AG: L. pneumophila Serogp 1 Ur Ag: NEGATIVE

## 2023-08-26 LAB — CULTURE, BLOOD (ROUTINE X 2)
Culture: NO GROWTH
Special Requests: ADEQUATE

## 2023-08-28 LAB — CULTURE, BLOOD (ROUTINE X 2): Special Requests: ADEQUATE

## 2023-09-04 ENCOUNTER — Other Ambulatory Visit: Payer: Self-pay | Admitting: Internal Medicine

## 2023-09-04 DIAGNOSIS — J690 Pneumonitis due to inhalation of food and vomit: Secondary | ICD-10-CM

## 2023-09-04 DIAGNOSIS — T17908A Unspecified foreign body in respiratory tract, part unspecified causing other injury, initial encounter: Secondary | ICD-10-CM

## 2023-09-12 ENCOUNTER — Emergency Department: Payer: Medicare Other

## 2023-09-12 ENCOUNTER — Other Ambulatory Visit: Payer: Self-pay

## 2023-09-12 ENCOUNTER — Emergency Department
Admission: EM | Admit: 2023-09-12 | Discharge: 2023-09-12 | Disposition: A | Discharge status: Home / Self Care | Payer: Medicare Other | Attending: Emergency Medicine | Admitting: Emergency Medicine

## 2023-09-12 DIAGNOSIS — J90 Pleural effusion, not elsewhere classified: Secondary | ICD-10-CM | POA: Insufficient documentation

## 2023-09-12 DIAGNOSIS — E279 Disorder of adrenal gland, unspecified: Secondary | ICD-10-CM | POA: Diagnosis not present

## 2023-09-12 DIAGNOSIS — Z20822 Contact with and (suspected) exposure to covid-19: Secondary | ICD-10-CM | POA: Insufficient documentation

## 2023-09-12 DIAGNOSIS — K769 Liver disease, unspecified: Secondary | ICD-10-CM | POA: Diagnosis not present

## 2023-09-12 DIAGNOSIS — J189 Pneumonia, unspecified organism: Secondary | ICD-10-CM

## 2023-09-12 DIAGNOSIS — I509 Heart failure, unspecified: Secondary | ICD-10-CM | POA: Insufficient documentation

## 2023-09-12 DIAGNOSIS — G20C Parkinsonism, unspecified: Secondary | ICD-10-CM | POA: Diagnosis not present

## 2023-09-12 DIAGNOSIS — R197 Diarrhea, unspecified: Secondary | ICD-10-CM | POA: Diagnosis present

## 2023-09-12 DIAGNOSIS — J168 Pneumonia due to other specified infectious organisms: Secondary | ICD-10-CM | POA: Diagnosis not present

## 2023-09-12 LAB — CBC WITH DIFFERENTIAL/PLATELET
Abs Immature Granulocytes: 0.02 10*3/uL (ref 0.00–0.07)
Basophils Absolute: 0 10*3/uL (ref 0.0–0.1)
Basophils Relative: 0 %
Eosinophils Absolute: 0.1 10*3/uL (ref 0.0–0.5)
Eosinophils Relative: 1 %
HCT: 31.4 % — ABNORMAL LOW (ref 39.0–52.0)
Hemoglobin: 10 g/dL — ABNORMAL LOW (ref 13.0–17.0)
Immature Granulocytes: 0 %
Lymphocytes Relative: 13 %
Lymphs Abs: 0.8 10*3/uL (ref 0.7–4.0)
MCH: 31.9 pg (ref 26.0–34.0)
MCHC: 31.8 g/dL (ref 30.0–36.0)
MCV: 100.3 fL — ABNORMAL HIGH (ref 80.0–100.0)
Monocytes Absolute: 0.5 10*3/uL (ref 0.1–1.0)
Monocytes Relative: 8 %
Neutro Abs: 4.6 10*3/uL (ref 1.7–7.7)
Neutrophils Relative %: 78 %
Platelets: 238 10*3/uL (ref 150–400)
RBC: 3.13 MIL/uL — ABNORMAL LOW (ref 4.22–5.81)
RDW: 15.5 % (ref 11.5–15.5)
WBC: 5.9 10*3/uL (ref 4.0–10.5)
nRBC: 0 % (ref 0.0–0.2)

## 2023-09-12 LAB — COMPREHENSIVE METABOLIC PANEL
ALT: 12 U/L (ref 0–44)
AST: 17 U/L (ref 15–41)
Albumin: 3 g/dL — ABNORMAL LOW (ref 3.5–5.0)
Alkaline Phosphatase: 135 U/L — ABNORMAL HIGH (ref 38–126)
Anion gap: 9 (ref 5–15)
BUN: 21 mg/dL (ref 8–23)
CO2: 30 mmol/L (ref 22–32)
Calcium: 8.8 mg/dL — ABNORMAL LOW (ref 8.9–10.3)
Chloride: 99 mmol/L (ref 98–111)
Creatinine, Ser: 0.72 mg/dL (ref 0.61–1.24)
GFR, Estimated: >60 mL/min (ref >60)
Glucose, Bld: 98 mg/dL (ref 70–99)
Potassium: 3.8 mmol/L (ref 3.5–5.1)
Sodium: 138 mmol/L (ref 135–145)
Total Bilirubin: 1.1 mg/dL (ref 0.0–1.2)
Total Protein: 6.6 g/dL (ref 6.5–8.1)

## 2023-09-12 LAB — LACTIC ACID, PLASMA
Lactic Acid, Venous: 0.8 mmol/L (ref 0.5–1.9)
Lactic Acid, Venous: 0.9 mmol/L (ref 0.5–1.9)

## 2023-09-12 LAB — PROTIME-INR
INR: 1.4 — ABNORMAL HIGH (ref 0.8–1.2)
Prothrombin Time: 17 s — ABNORMAL HIGH (ref 11.4–15.2)

## 2023-09-12 LAB — APTT: aPTT: 49 s — ABNORMAL HIGH (ref 24–36)

## 2023-09-12 LAB — CBC
HCT: 33.8 % — ABNORMAL LOW (ref 39.0–52.0)
Hemoglobin: 10.7 g/dL — ABNORMAL LOW (ref 13.0–17.0)
MCH: 32.2 pg (ref 26.0–34.0)
MCHC: 31.7 g/dL (ref 30.0–36.0)
MCV: 101.8 fL — ABNORMAL HIGH (ref 80.0–100.0)
Platelets: 251 10*3/uL (ref 150–400)
RBC: 3.32 MIL/uL — ABNORMAL LOW (ref 4.22–5.81)
RDW: 15.4 % (ref 11.5–15.5)
WBC: 6.6 10*3/uL (ref 4.0–10.5)
nRBC: 0 % (ref 0.0–0.2)

## 2023-09-12 LAB — TYPE AND SCREEN
ABO/RH(D): O POS
Antibody Screen: NEGATIVE

## 2023-09-12 LAB — RESP PANEL BY RT-PCR (RSV, FLU A&B, COVID)  RVPGX2
Influenza A by PCR: NEGATIVE
Influenza B by PCR: NEGATIVE
Resp Syncytial Virus by PCR: NEGATIVE
SARS Coronavirus 2 by RT PCR: NEGATIVE

## 2023-09-12 LAB — LIPASE, BLOOD: Lipase: 24 U/L (ref 11–51)

## 2023-09-12 MED ORDER — ACETAMINOPHEN 325 MG PO TABS
650.0000 mg | ORAL_TABLET | Freq: Once | ORAL | Status: AC
  Administered 2023-09-12: 650 mg via ORAL
  Filled 2023-09-12: qty 2

## 2023-09-12 MED ORDER — IOHEXOL 300 MG/ML  SOLN
100.0000 mL | Freq: Once | INTRAMUSCULAR | Status: AC | PRN
  Administered 2023-09-12: 100 mL via INTRAVENOUS

## 2023-09-12 MED ORDER — SODIUM CHLORIDE 0.9 % IV SOLN
1.0000 g | Freq: Once | INTRAVENOUS | Status: AC
  Administered 2023-09-12: 1 g via INTRAVENOUS
  Filled 2023-09-12: qty 10

## 2023-09-12 MED ORDER — DOXYCYCLINE HYCLATE 100 MG PO CAPS: 100.0000 mg | ORAL_CAPSULE | Freq: Two times a day (BID) | ORAL | 0 refills | Status: AC

## 2023-09-12 MED ORDER — LACTATED RINGERS IV BOLUS
1000.0000 mL | Freq: Once | INTRAVENOUS | Status: AC
  Administered 2023-09-12: 1000 mL via INTRAVENOUS

## 2023-09-12 MED ORDER — CEFDINIR 300 MG PO CAPS: 300.0000 mg | ORAL_CAPSULE | Freq: Two times a day (BID) | ORAL | 0 refills | Status: AC

## 2023-09-12 MED ORDER — DOXYCYCLINE HYCLATE 100 MG PO TABS
100.0000 mg | ORAL_TABLET | Freq: Once | ORAL | Status: AC
  Administered 2023-09-12: 100 mg via ORAL
  Filled 2023-09-12: qty 1

## 2023-09-12 NOTE — ED Provider Notes (Signed)
 [facility] Provider Note    Event Date/Time   First MD Initiated Contact with Patient 09/12/23 1134     (approximate)   History   Diarrhea   HPI Terry Collins is a 88 y.o. male patient with history of Parkinson's disease, A-fib on Pradaxa , presenting with dark diarrhea for since Wednesday.  Per daughter as well as caregiver, they noted dark stools for the past week.  Had been giving him fluids but are concerned that maybe now he is a bit more dehydrated.  They state that he was treated for pneumonia in late December.  Has an intermittent cough but no fever.  No nausea or vomiting.  He has no complaints of chest pain or shortness of breath.  No abdominal pain.  No trauma or falls.  Last took his Pradaxa  yesterday, did not take any today.  Has not had prior GI bleeds in the past.  On independent chart review he was admitted in December for acute hypoxic respiratory failure, does have a history of CHF was found to have multifocal pneumonia.  He was treated with antibiotics and discharged with Zithromax  and Omnicef .  Daughter states that he has completed his course of antibiotics.     Physical Exam   Triage Vital Signs: ED Triage Vitals  Encounter Vitals Group     BP 09/12/23 0912 119/69     Systolic BP Percentile --      Diastolic BP Percentile --      Pulse Rate 09/12/23 0912 (!) 102     Resp 09/12/23 0912 17     Temp 09/12/23 0912 97.8 F (36.6 C)     Temp Source 09/12/23 0912 Oral     SpO2 09/12/23 0912 100 %     Weight 09/12/23 0913 160 lb (72.6 kg)     Height 09/12/23 0913 6' 2 (1.88 m)     Head Circumference --      Peak Flow --      Pain Score 09/12/23 0913 0     Pain Loc --      Pain Education --      Exclude from Growth Chart --     Most recent vital signs: Vitals:   09/12/23 0912 09/12/23 1331  BP: 119/69 118/72  Pulse: (!) 102 98  Resp: 17 17  Temp: 97.8 F (36.6 C) 97.7 F (36.5 C)  SpO2: 100% 100%    General: Awake, no  distress.  Mildly dry mucous membranes. CV:  Good peripheral perfusion.  Resp:  Normal effort.  Lungs clear Abd:  No distention.  Nontender Other:  Rectal exam was done with chaperone, dark green stool in rectal vault, Hemoccult negative.   ED Results / Procedures / Treatments   Labs (all labs ordered are listed, but only abnormal results are displayed) Labs Reviewed  COMPREHENSIVE METABOLIC PANEL - Abnormal; Notable for the following components:      Result Value   Calcium 8.8 (*)    Albumin 3.0 (*)    Alkaline Phosphatase 135 (*)    All other components within normal limits  CBC - Abnormal; Notable for the following components:   RBC 3.32 (*)    Hemoglobin 10.7 (*)    HCT 33.8 (*)    MCV 101.8 (*)    All other components within normal limits  PROTIME-INR - Abnormal; Notable for the following components:   Prothrombin Time 17.0 (*)    INR 1.4 (*)    All other components  within normal limits  APTT - Abnormal; Notable for the following components:   aPTT 49 (*)    All other components within normal limits  CBC WITH DIFFERENTIAL/PLATELET - Abnormal; Notable for the following components:   RBC 3.13 (*)    Hemoglobin 10.0 (*)    HCT 31.4 (*)    MCV 100.3 (*)    All other components within normal limits  RESP PANEL BY RT-PCR (RSV, FLU A&B, COVID)  RVPGX2  CULTURE, BLOOD (ROUTINE X 2)  CULTURE, BLOOD (ROUTINE X 2)  LIPASE, BLOOD  LACTIC ACID, PLASMA  LACTIC ACID, PLASMA  URINALYSIS, ROUTINE W REFLEX MICROSCOPIC  TYPE AND SCREEN    RADIOLOGY Chest x-ray on my interpretation showed bilateral upper lobe consolidations.  My interpretation of CT abdomen pelvis showed multiple lesions in the liver.   PROCEDURES:  Critical Care performed: No  Procedures   MEDICATIONS ORDERED IN ED: Medications  cefTRIAXone  (ROCEPHIN ) 1 g in sodium chloride  0.9 % 100 mL IVPB (1 g Intravenous New Bag/Given 09/12/23 1445)  acetaminophen  (TYLENOL ) tablet 650 mg (650 mg Oral Given 09/12/23  1230)  lactated ringers  bolus 1,000 mL (0 mLs Intravenous Stopped 09/12/23 1447)  iohexol  (OMNIPAQUE ) 300 MG/ML solution 100 mL (100 mLs Intravenous Contrast Given 09/12/23 1315)  doxycycline  (VIBRA -TABS) tablet 100 mg (100 mg Oral Given 09/12/23 1446)     IMPRESSION / MDM / ASSESSMENT AND PLAN / ED COURSE  I reviewed the triage vital signs and the nursing notes.                              Differential diagnosis includes, but is not limited to, GI bleeding, anemia, viral illness, norovirus, flu, colitis, diverticulitis, electrolyte derangements, dehydration.  Patient's presentation is most consistent with acute complicated illness / injury requiring diagnostic workup.  Patient with history of Parkinson's, on oxygen after his pneumonia in December, oxygen levels are stable per daughter.  Presenting with diarrhea that was dark.  Rectal exam did not reveal Hemoccult positive blood, his H&H x 2 are stable, had a CT abdomen pelvis that did not show colitis but did show right hepatic lobe focal densities as well as an adrenal nodule.  Chest x-ray showed bilateral pneumonias they are improving but not completely resolved.  Given his persistent pneumonia, we will start him on antibiotics, will give him ceftriaxone  and doxycycline  here in the ED and discharging with cefdinir  and doxycycline .  On reassessment he is looking a lot better, looks less dehydrated.  My interpretation of labs, no leukocytosis, flu, RSV, COVID is negative, electrolytes are also not severely deranged, creatinine is normal.  Given that he is overall clinically looking better, not having increased oxygen requirement, I believe he safe for outpatient management with antibiotics.  Discussed incidental findings with daughter, and she is agreeable with plan for outpatient management.  Considered but no indication for inpatient mission at this time, he is safe for discharge home.  Strict return precautions given.  Antibiotics prescription  given.  Clinical Course as of 09/12/23 1505  Tue Sep 12, 2023  1208 Rectal exam was done with chaperone, dark green stool in rectal vault, Hemoccult negative. [TT]  1208 Hemoglobin(!): 10.7 Stable compared to prior [TT]  1421 CT ABDOMEN PELVIS W CONTRAST CT findings on my interpretation, noted multiple lesions noted in the liver.  Radiology states that the hepatic lesions could be inflammatory changes versus edema versus underlying malignancy.  Patient has no jaundice or  right upper quadrant abdominal pain, his LFTs are not severely deranged.  Shared decision making done with daughter and she is agreeable plan for outpatient management and follow-up with  primary care doctor for further workup.  Rads: IMPRESSION: Right hepatic lobe is diffusely heterogeneous with focal areas of low density seen in posterior segment. This may represent inflammation such as hepatitis or possibly edema, but underlying malignancy cannot be excluded. When the patient is clinically stable and able to follow directions and hold their breath, further evaluation with dedicated abdominal MRI should be considered.  Stable 2.4 cm left adrenal nodule most consistent with adenoma.  Mild septal thickening is noted in visualized lung bases concerning for edema, with small right pleural effusion.   [TT]  1422 DG Chest 2 View Chest x-ray interpretation on my read, noted upper lobe consolidations on the left and right.  Radiology impression: IMPRESSION: 1. Persistent fluctuating bilateral upper lobe predominant airspace opacities, as described, slightly improved on the right and worse on the left. Findings may reflect multilobar pneumonia or asymmetric edema. 2. Unchanged left basilar airspace disease and probable small pleural effusions. 3. Followup PA and lateral chest X-ray is recommended in 4-6 weeks following appropriate therapy to ensure resolution and exclude underlying malignancy.    [TT]  1423 Lactic  Acid, Venous: 0.8 Lactate is normal [TT]  1423 Resp panel by RT-PCR (RSV, Flu A&B, Covid) Anterior Nasal Swab Negative [TT]  1423 Comprehensive metabolic panel(!) Electrolytes not severely deranged, LFTs not severely deranged. [TT]  1423 WBC: 6.6 No leukocytosis. [TT]  1445 Hemoglobin(!): 10.0 Repeat H&H is stable. [TT]    Clinical Course User Index [TT] Waymond Lorelle Cummins, MD     FINAL CLINICAL IMPRESSION(S) / ED DIAGNOSES   Final diagnoses:  Hepatic lesion  Adrenal nodule (HCC)  Pneumonia due to infectious organism, unspecified laterality, unspecified part of lung  Diarrhea, unspecified type  Pleural effusion     Rx / DC Orders   ED Discharge Orders          Ordered    cefdinir  (OMNICEF ) 300 MG capsule  2 times daily        09/12/23 1458    doxycycline  (VIBRAMYCIN ) 100 MG capsule  2 times daily        09/12/23 1458             Note:  This document was prepared using Dragon voice recognition software and may include unintentional dictation errors.    Waymond Lorelle Cummins, MD 09/12/23 [phone]

## 2023-09-12 NOTE — Discharge Instructions (Addendum)
 Please take the antibiotics as prescribed for the pneumonia seen on x-ray.  Make a follow-up appointment with your primary care doctor in 2 to 3 days for reassessment and to follow-up for the incidental findings noted on the CAT scan.  CT: CLINICAL DATA:  Diarrhea.   EXAM: CT ABDOMEN AND PELVIS WITH CONTRAST   TECHNIQUE: Multidetector CT imaging of the abdomen and pelvis was performed using the standard protocol following bolus administration of intravenous contrast.   RADIATION DOSE REDUCTION: This exam was performed according to the departmental dose-optimization program which includes automated exposure control, adjustment of the mA and/or kV according to patient size and/or use of iterative reconstruction technique.   CONTRAST:  OMNIPAQUE  IOHEXOL  300 MG/ML  SOLN   COMPARISON:  December 15, 2020.   FINDINGS: Lower chest: Mild septal thickening is noted in visualized lung bases concerning for edema with small right pleural effusion.   Hepatobiliary: No cholelithiasis or biliary dilatation. Right hepatic lobe is diffusely heterogeneous with focal areas of low density seen in posterior segment. This may represent edema or inflammation, but underlying malignancy cannot be excluded.   Pancreas: Unremarkable. No pancreatic ductal dilatation or surrounding inflammatory changes.   Spleen: Normal in size without focal abnormality.   Adrenals/Urinary Tract: Stable 2.4 cm left adrenal nodule is noted concerning for adenoma. Right adrenal gland is unremarkable. Stable bilateral renal cysts are noted for which no further follow-up is required. No hydronephrosis or renal obstruction. Urinary bladder is not well visualized due to scatter artifact arising from left hip arthroplasty.   Stomach/Bowel: The stomach is unremarkable. There is no evidence of bowel obstruction or inflammation. Appendix is not visualized.   Vascular/Lymphatic: Aortic atherosclerosis. No enlarged  abdominal or pelvic lymph nodes.   Reproductive: Status post prostatic brachytherapy seed placement.   Other: No ascites or hernia is noted.   Musculoskeletal: Status post left total hip arthroplasty. No acute osseous abnormality is noted.   IMPRESSION: Right hepatic lobe is diffusely heterogeneous with focal areas of low density seen in posterior segment. This may represent inflammation such as hepatitis or possibly edema, but underlying malignancy cannot be excluded. When the patient is clinically stable and able to follow directions and hold their breath, further evaluation with dedicated abdominal MRI should be considered.   Stable 2.4 cm left adrenal nodule most consistent with adenoma.   Mild septal thickening is noted in visualized lung bases concerning for edema, with small right pleural effusion.   Aortic Atherosclerosis (ICD10-I70.0).

## 2023-09-12 NOTE — ED Triage Notes (Signed)
 Pt comes in today from [facility] via [facility] with complaints of diarrhea since Thursday, it has now turned to black stools. Pt has no complaints of abdominal pain at this time. Pt is alert and oriented x4, with no signs of acute distress at this time.

## 2023-09-13 ENCOUNTER — Ambulatory Visit
Admission: RE | Admit: 2023-09-13 | Discharge: 2023-09-13 | Disposition: A | Discharge status: Home / Self Care | Payer: Medicare Other | Source: Ambulatory Visit | Attending: Internal Medicine | Admitting: Internal Medicine

## 2023-09-13 DIAGNOSIS — R1312 Dysphagia, oropharyngeal phase: Secondary | ICD-10-CM | POA: Diagnosis not present

## 2023-09-13 DIAGNOSIS — G20A1 Parkinson's disease without dyskinesia, without mention of fluctuations: Secondary | ICD-10-CM | POA: Insufficient documentation

## 2023-09-13 DIAGNOSIS — T17908A Unspecified foreign body in respiratory tract, part unspecified causing other injury, initial encounter: Secondary | ICD-10-CM | POA: Diagnosis present

## 2023-09-13 DIAGNOSIS — J690 Pneumonitis due to inhalation of food and vomit: Secondary | ICD-10-CM | POA: Insufficient documentation

## 2023-09-13 NOTE — Progress Notes (Signed)
 Modified Barium Swallow Study  Patient Details  Name: Terry Collins MRN: [id number] Date of Birth: November 05, 1935  Today's Date: 09/13/2023  Modified Barium Swallow completed.  Full report located under Chart Review in the Imaging Section.  History of Present Illness Pt is an 88 y.o.male patient who "stated to be doing reasonably well at a recent PCP appt 08/31/2023 but still complains of general fatigue, weakness.  PMH includes Parkinson's Dis. which limits his activity and movement; Fall w/ hip fx 05/2023, CHF.  Patient has been slightly hypotensive on midodrine .  Patient was recently seen in the emergency room for possible pneumonia in Dec. 2024 and treated, O2 for an aspiration event, w/ weakness and fatigue and hypotension.".  He was seen on 09/12/2023 in the ED presenting with diarrhea, dehydraton.      Chest Imaging in Dec. 2024; and now on 09/12/2023 which revealed: "Persistent fluctuating bilateral upper lobe predominant airspace opacities, as described, slightly improved on the right and worse on the left. Findings may reflect multilobar pneumonia or asymmetric edema.  2. Unchanged left basilar airspace disease and probable small pleural effusions.  No other consistent chest imaging prior per chart.    OF NOTE: Daughter present this study endorsed pt having overt s/s of aspiration and Dysphagia w/ oral intake at his living facility.  ST services have been following pt there for Dysphagia tx for concern of aspiration w/ oral intake.  Unsure of pt's Baseline Cognitive status.  He is weak-appearing and requires FULL support/assit w/ standing/transferring b/t chairs.  Noted BMI of 20.54 per chart.   Clinical Impression Patient presents with what appears to be Moderate+, chronic oropharyngeal dysphagia with contributing factors of Parkinson's Dis w/ potential cognitive decline, Moderate+ generalized weakness, sensory deficits, and age-related changes. Oral phase is characterized by lingual weakness,  disorganized lingual control/transport w/ occasional lateral sulci escape as well as anterior leakage. No significant oral residue remained post initial swallow. Swallow initiation is consistently delayed: to the level of the pyriform sinuses with thin liquids at times.  Pharyngeal phase is noted for decreased base of tongue retraction, larygeal excursion, and reduced stripping wave/pharyngeal pressure leading to Moderate+ pharyngeal residue collection in the valleculae, pyriform sinuses, and aeryepiglottic folds. Min+ reduced amplitude of pharyngoesophageal segment opening for bolus clearance d/t generalized weakness; pyriform sinus residue prominent. Shallow>min deep laryngeal penetration occured x2 with thin liquids (Min-Mod bolus residue coating along the underside of the epiglottis was consistent). No immediate aspiration occurred. MOD+ laryngeal penetration occurred BETWEEN trials from the remaining pharyngeal residue -- this penetration THEN ASPIRATION WAS SILENT. Subsequent effortful swallows were marginally effective in reducing the pharyngeal residue b/t trials. Use of throat clear/cough and re-swallow appeared to reduce some laryngeal penetration and pharyngeal residue b/t trials but build-up of SILENT ASPIRATION continued to occur -- NO COUGH RESPONSE to it.  No obvious cervical Esophageal dysmotility was noted in the viewable area.   Reviewed study findings with patient and patient's Daughter using images and discussion/examples for clarification; Caregiver present also. Daughter again endorsed overt s/s of Dysphagia w/ oral intake baseline at his Facility. She also stated patient has a Palliative Care consult on 09/14/2023 to discuss GOC and expressed desire for pt to eat and drink safelyt but for his Pleasure also.  Discussed strategies that may reduce but cannot prevent aspiration from occurring, w/ need for consistent Supervision and cuing to implement strategies effectively. Daugher verbalizes  understanding of risks, strategies, and precautions.  Recommend ongoing f/u w/ ST services at Facility and w/  Palliative Care. Daughter also given information on a Dysphagia drink cup for Flow/Bolus control w/ both Nectar and thin liquids(she bought during this session). Recommended pt/Daughter to discuss option of small sips of thin liquids (Water) for Pleasure b/t meals and Post oral care using his strategies and the Dysphagia drink cup, per his established GOC. Daughter agreed w/ above. Factors that may increase risk of adverse event in presence of aspiration [name] & [name] 2021): Poor general health and/or compromised immunity;Reduced cognitive function;Limited mobility;Frail or deconditioned;Weak cough (Parkinson's Dis.)  Swallow Evaluation Recommendations Recommendations: PO diet PO Diet Recommendation: Dysphagia 3 (Mechanical soft);Mildly thick liquids (Level 2, nectar thick) (meats Minced if needed for conservation of energy; gravies to moisten foods) Liquid Administration via: Cup;No straw Medication Administration: Crushed with puree Supervision: Patient able to self-feed;Staff to assist with self-feeding;Full assist for feeding;Full supervision/cueing for swallowing strategies Swallowing strategies  : Minimize environmental distractions;Slow rate;Small bites/sips;Check for anterior loss;effortful swallow;Multiple dry swallows after each bite/sip;Follow solids with liquids;Clear throat intermittently Postural changes: Position pt fully upright for meals;Stay upright 30-60 min after meals;Out of bed for meals Oral care recommendations: Oral care BID (2x/day);Oral care before ice chips/water;Oral care before PO;Staff/trained caregiver to provide oral care Recommended consults: Consider Palliative care for GOC discussion(scheduled for 09/14/2023, per Dtr) Caregiver Recommendations: Avoid jello, ice cream, thin soups, popsicles;Remove water pitcher;Have oral suction  available        Darla Edward, MS, CCC-SLP Speech Language Pathologist Rehab Services; [facility] [phone] (ascom) Edia Pursifull 09/13/2023,6:45 PM

## 2023-09-17 LAB — CULTURE, BLOOD (ROUTINE X 2)
Culture: NO GROWTH
Culture: NO GROWTH
Special Requests: ADEQUATE
Special Requests: ADEQUATE

## 2023-11-28 DEATH — deceased
